# Patient Record
Sex: Female | Born: 2000 | Race: White | Hispanic: No | Marital: Single | Smoking: Never smoker
Health system: Southern US, Community
[De-identification: ages and names within clinical notes are randomized; demographics above are authoritative.]

## PROBLEM LIST (undated history)

## (undated) ENCOUNTER — Inpatient Hospital Stay (HOSPITAL_COMMUNITY): Payer: Self-pay

## (undated) DIAGNOSIS — F909 Attention-deficit hyperactivity disorder, unspecified type: Secondary | ICD-10-CM

## (undated) DIAGNOSIS — O139 Gestational [pregnancy-induced] hypertension without significant proteinuria, unspecified trimester: Secondary | ICD-10-CM

## (undated) DIAGNOSIS — R87629 Unspecified abnormal cytological findings in specimens from vagina: Secondary | ICD-10-CM

## (undated) DIAGNOSIS — F32A Depression, unspecified: Secondary | ICD-10-CM

## (undated) DIAGNOSIS — F419 Anxiety disorder, unspecified: Secondary | ICD-10-CM

## (undated) HISTORY — DX: Anxiety disorder, unspecified: F41.9

## (undated) HISTORY — DX: Unspecified abnormal cytological findings in specimens from vagina: R87.629

## (undated) HISTORY — DX: Attention-deficit hyperactivity disorder, unspecified type: F90.9

## (undated) HISTORY — PX: WISDOM TOOTH EXTRACTION: SHX21

## (undated) HISTORY — DX: Depression, unspecified: F32.A

## (undated) HISTORY — DX: Gestational (pregnancy-induced) hypertension without significant proteinuria, unspecified trimester: O13.9

---
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
## 2018-04-05 ENCOUNTER — Other Ambulatory Visit: Payer: Self-pay | Admitting: Otolaryngology

## 2018-04-05 DIAGNOSIS — M898X9 Other specified disorders of bone, unspecified site: Secondary | ICD-10-CM

## 2018-04-11 ENCOUNTER — Ambulatory Visit
Admission: RE | Admit: 2018-04-11 | Discharge: 2018-04-11 | Disposition: A | Discharge status: Home / Self Care | Payer: 59 | Source: Ambulatory Visit | Attending: Otolaryngology | Admitting: Otolaryngology

## 2018-04-11 DIAGNOSIS — M898X9 Other specified disorders of bone, unspecified site: Secondary | ICD-10-CM

## 2018-07-03 ENCOUNTER — Ambulatory Visit (INDEPENDENT_AMBULATORY_CARE_PROVIDER_SITE_OTHER): Payer: 59 | Admitting: Women's Health

## 2018-07-03 ENCOUNTER — Encounter: Payer: Self-pay | Admitting: Women's Health

## 2018-07-03 VITALS — BP 118/80 | Ht 61.0 in | Wt 170.0 lb

## 2018-07-03 DIAGNOSIS — Z113 Encounter for screening for infections with a predominantly sexual mode of transmission: Secondary | ICD-10-CM | POA: Diagnosis not present

## 2018-07-03 DIAGNOSIS — Z01419 Encounter for gynecological examination (general) (routine) without abnormal findings: Secondary | ICD-10-CM | POA: Diagnosis not present

## 2018-07-03 MED ORDER — FLUCONAZOLE 150 MG PO TABS: 150.0000 mg | ORAL_TABLET | Freq: Once | ORAL | 0 refills | Status: AC

## 2018-07-03 MED ORDER — ETONOGESTREL-ETHINYL ESTRADIOL 0.12-0.015 MG/24HR VA RING: VAGINAL_RING | VAGINAL | 4 refills | Status: DC

## 2018-07-03 NOTE — Patient Instructions (Signed)

## 2018-07-03 NOTE — Progress Notes (Signed)
Sarah Rollins [birth year] [id number]    History:    Presents for annual exam.  Irregular cycle, cycle started yesterday, had been on Depo-Provera until September.  OCs in the past unable to remember to take daily.  One partner for 1 year, recent break-up with questionable fidelity.  Currently being treated for UTI.   Gardasil completed.  Normal CBC, CMP 02/2018.  Past medical history, past surgical history, family history and social history were all reviewed and documented in the EPIC chart.  Currently not working or attending school.  Lives with her mother.  ROS:  A ROS was performed and pertinent positives and negatives are included.  Exam:  Vitals:   07/03/18 1457  BP: 118/80  Weight: 170 lb (77.1 kg)  Height: 5\' 1"  (1.549 m)   Body mass index is 32.12 kg/m.   General appearance:  Normal Thyroid:  Symmetrical, normal in size, without palpable masses or nodularity. Respiratory  Auscultation:  Clear without wheezing or rhonchi Cardiovascular  Auscultation:  Regular rate, without rubs, murmurs or gallops  Edema/varicosities:  Not grossly evident Abdominal  Soft,nontender, without masses, guarding or rebound.  Liver/spleen:  No organomegaly noted  Hernia:  None appreciated  Skin  Inspection:  Grossly normal   Breasts: Examined lying and sitting.     Right: Without masses, retractions, discharge or axillary adenopathy.     Left: Without masses, retractions, discharge or axillary adenopathy. Gentitourinary   Inguinal/mons:  Normal without inguinal adenopathy  External genitalia:  Normal  BUS/Urethra/Skene's glands:  Normal  Vagina:  Normal  Cervix:  Normal  Uterus:   normal in size, shape and contour.  Midline and mobile  Adnexa/parametria:     Rt: Without masses or tenderness.   Lt: Without masses or tenderness.  Anus and perineum: Normal    Assessment/Plan:  18 y.o. S WF G0 for annual exam with no complaints.  Contraception management Recurrent UTIs/currently being  treated Obesity STD screen Labs-primary care  Plan: Contraception options reviewed, NuvaRing prescription, proper use, slight risk for blood clots and strokes reviewed.  Start up instructions discussed will insert first of the month remove the 25th, questions answered, reviewed importance of condoms until permanent partner.  Aware not effective first month.  SBEs, exercise, calcium rich foods, MVI daily encouraged.  Campus safety reviewed.  GC/chlamydia, HIV, hep B, C, RPR.  Diflucan 150 mg 1 dose given if needed after current antibiotic use for UTI.  UTI prevention discussed.  UA pending.   Harrington Challenger [facility], 3:12 PM 07/03/2018

## 2018-07-04 LAB — RPR: RPR Ser Ql: NONREACTIVE

## 2018-07-04 LAB — HIV ANTIBODY (ROUTINE TESTING W REFLEX): HIV 1&2 Ab, 4th Generation: NONREACTIVE

## 2018-07-04 LAB — C. TRACHOMATIS/N. GONORRHOEAE RNA
C. trachomatis RNA, TMA: NOT DETECTED
N. GONORRHOEAE RNA, TMA: NOT DETECTED

## 2018-07-04 LAB — HEPATITIS C ANTIBODY
Hepatitis C Ab: NONREACTIVE
SIGNAL TO CUT-OFF: 0.03 (ref <1.00)

## 2018-07-04 LAB — HEPATITIS B SURFACE ANTIGEN: Hepatitis B Surface Ag: NONREACTIVE

## 2018-07-05 LAB — URINE CULTURE
MICRO NUMBER:: 114746
Result:: NO GROWTH
SPECIMEN QUALITY:: ADEQUATE

## 2018-07-05 LAB — URINALYSIS, COMPLETE W/RFL CULTURE
BACTERIA UA: NONE SEEN /HPF
BILIRUBIN URINE: NEGATIVE
Hyaline Cast: NONE SEEN /LPF
KETONES UR: NEGATIVE
LEUKOCYTE ESTERASE: NEGATIVE
Nitrites, Initial: POSITIVE — AB
PROTEIN: NEGATIVE
SQUAMOUS EPITHELIAL / LPF: NONE SEEN /HPF (ref <=5)
Specific Gravity, Urine: 1.015 (ref 1.001–1.03)
pH: 5 (ref 5.0–8.0)

## 2018-07-05 LAB — CULTURE INDICATED

## 2018-10-17 ENCOUNTER — Ambulatory Visit (INDEPENDENT_AMBULATORY_CARE_PROVIDER_SITE_OTHER): Payer: 59 | Admitting: Women's Health

## 2018-10-17 ENCOUNTER — Encounter: Payer: Self-pay | Admitting: Women's Health

## 2018-10-17 ENCOUNTER — Other Ambulatory Visit: Payer: Self-pay

## 2018-10-17 VITALS — BP 128/80

## 2018-10-17 DIAGNOSIS — L739 Follicular disorder, unspecified: Secondary | ICD-10-CM

## 2018-10-17 MED ORDER — FLUCONAZOLE 150 MG PO TABS: 150.0000 mg | ORAL_TABLET | Freq: Once | ORAL | 1 refills | Status: AC

## 2018-10-17 MED ORDER — SULFAMETHOXAZOLE-TRIMETHOPRIM 800-160 MG PO TABS: 1.0000 | ORAL_TABLET | Freq: Two times a day (BID) | ORAL | 0 refills | Status: DC

## 2018-10-17 NOTE — Patient Instructions (Signed)

## 2018-10-17 NOTE — Progress Notes (Signed)
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  Questions if it is a spider bite.?  Denies vaginal discharge, itching, odor, urinary symptoms, abdominal pain or fever.  Not currently sexually active, denies need for STD screen, negative screen 06/2018.  Had been on NuvaRing, had lost insurance so stopped.  On last day of cycle today.  Exam: Appears well.  External genitalia right labia majora edematous, erythemic nonindurated,  nonfluctuant, tender to touch.  No visible discharge.  Folliculitis  Plan: Reviewed most likely a folliculitis, encouraged warm baths, keep area dry and clean, open to air as able, loose clothing.  Septra twice daily for 7 days, reviewed this may help it to drain and resolve.  Diflucan 1501 dose prescriptions for both given.  Instructed to call if no relief or area does not improve.  A sample of NuvaRing given, insert today, aware not contraceptive first month  but states is not planning on being sexually active.  Reviewed importance of condoms especially first month and  until permanent partner.Marland Kitchen

## 2018-12-13 ENCOUNTER — Telehealth: Payer: Self-pay | Admitting: *Deleted

## 2018-12-13 MED ORDER — ETONOGESTREL-ETHINYL ESTRADIOL 0.12-0.015 MG/24HR VA RING: 1.0000 | VAGINAL_RING | VAGINAL | 1 refills | Status: DC

## 2018-12-13 NOTE — Telephone Encounter (Signed)
Patient called requesting nuvaring Rx sent to pharmacy. Rx was not received by pharmacy from [facility] 07/03/18.

## 2018-12-23 IMAGING — CT CT HEAD W/O CM
1 series · 16 of 30 positions shown, 20 images · non-contrast
Comparison: 02/19/2017

CLINICAL DATA: Bony growth on the skull behind the right ear.

EXAM:
CT HEAD WITHOUT CONTRAST
TECHNIQUE: Contiguous axial images were obtained from the base of the skull
through the vertex without intravenous contrast.

[Series 2: head w/(date) · axial · 0.44mm/px · z∈[-190,-45]mm · 16 of 33 slices shown, 20 images]
[im 2/33  brain]
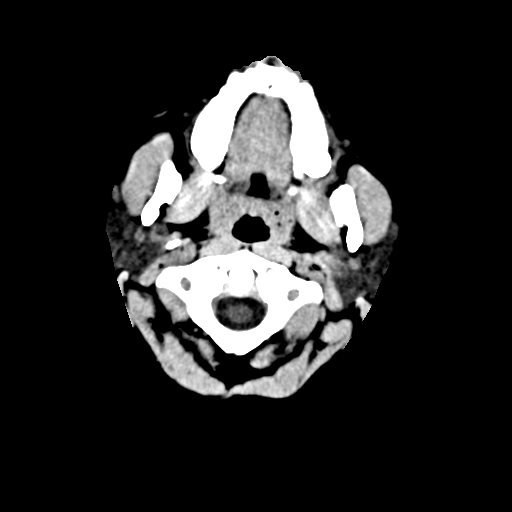
[im 2/33  bone]
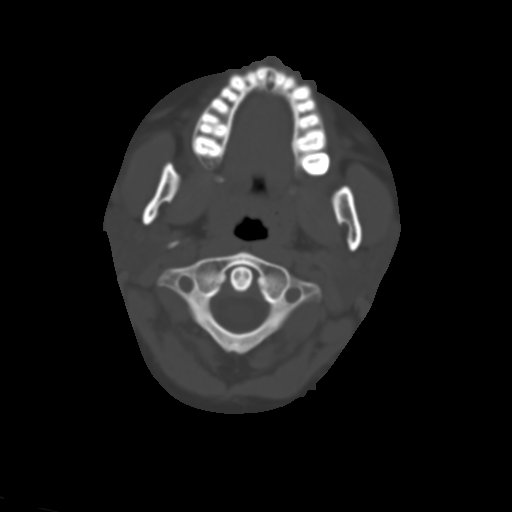
[im 4/33  brain]
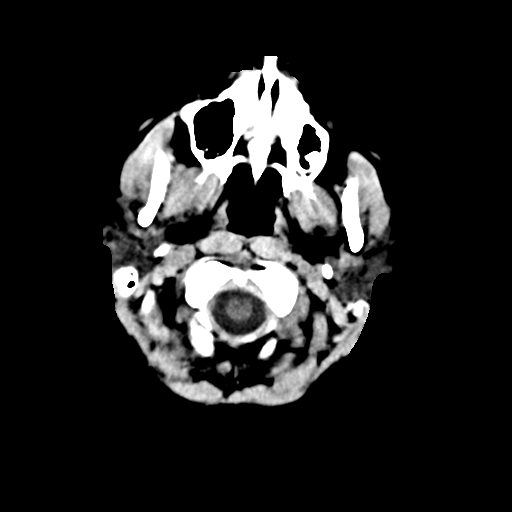
[im 6/33  brain]
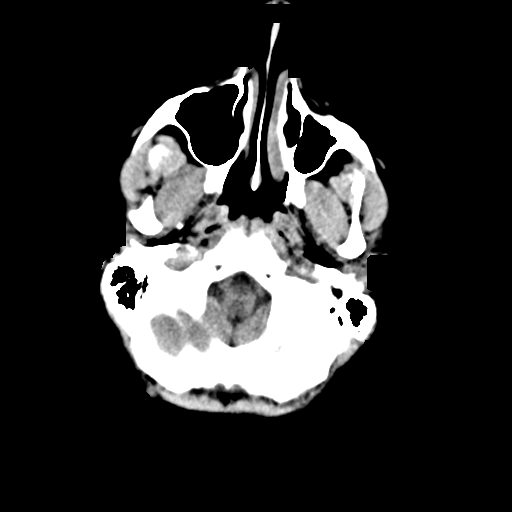
[im 8/33  brain]
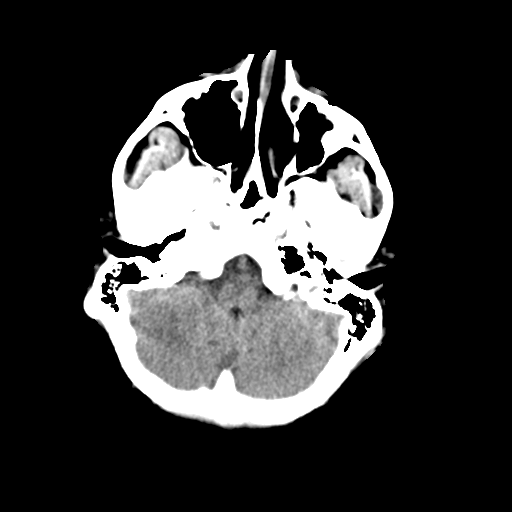
[im 9/33  brain]
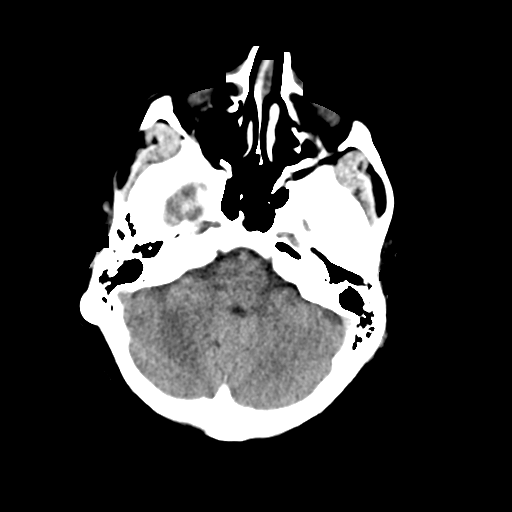
[im 9/33  bone]
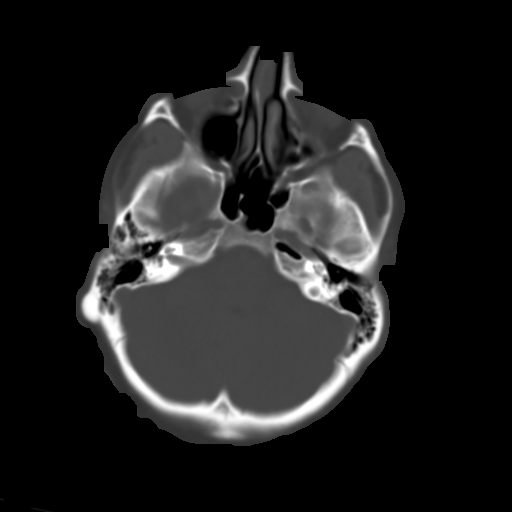
[im 12/33  brain]
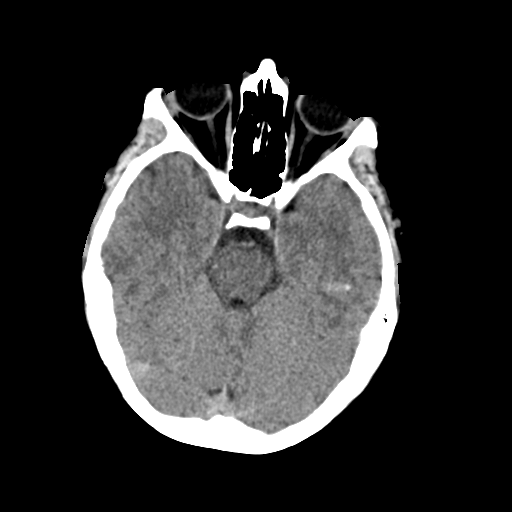
[im 14/33  brain]
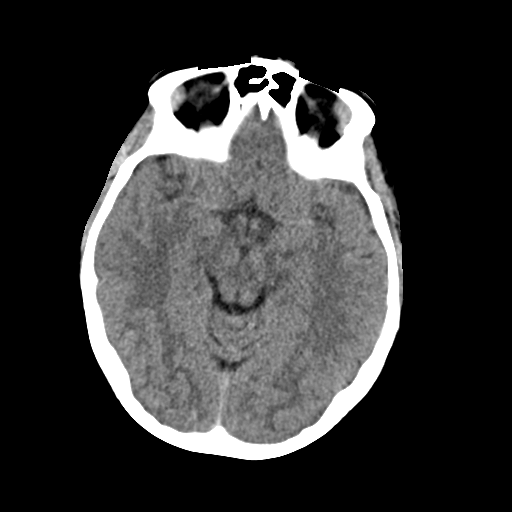
[im 16/33  brain]
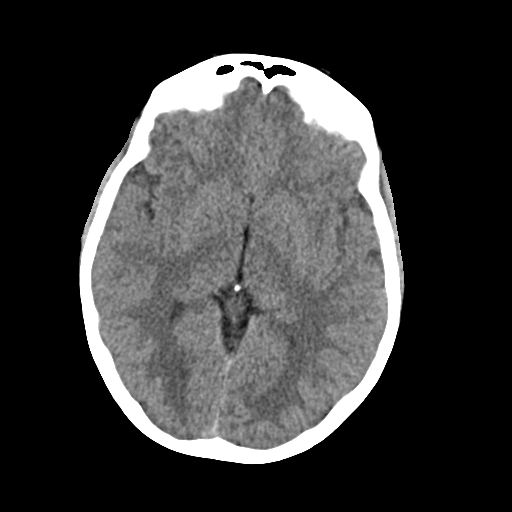
[im 17/33  brain]
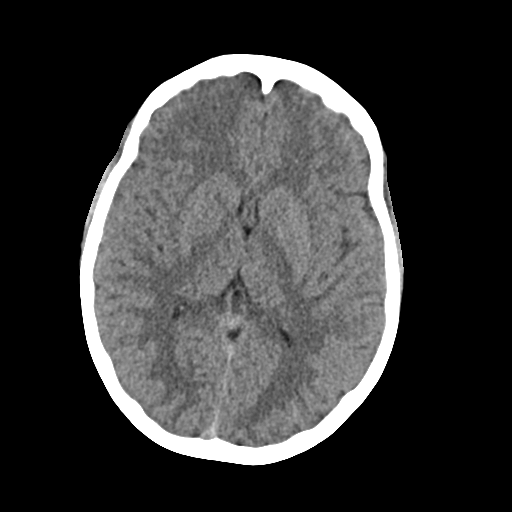
[im 17/33  bone]
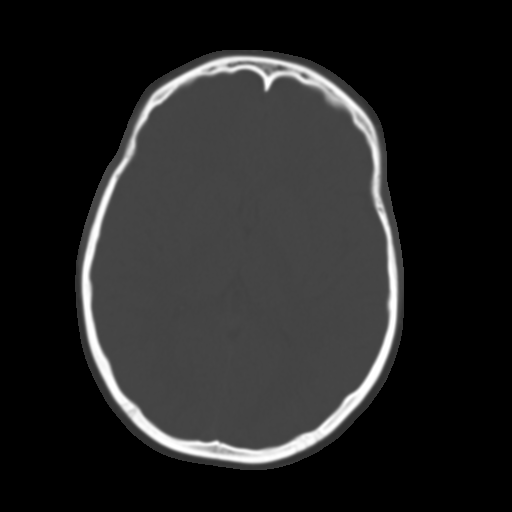
[im 19/33  brain]
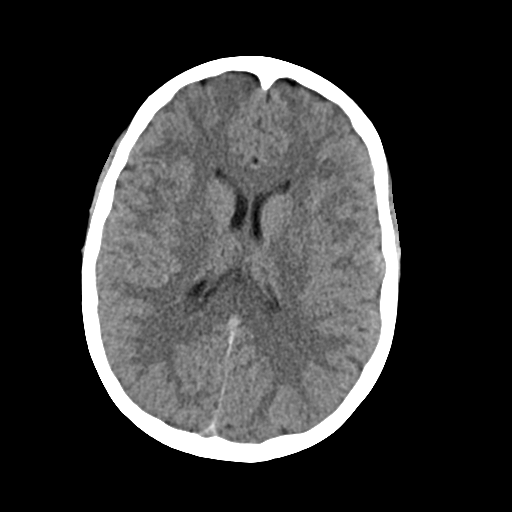
[im 21/33  brain]
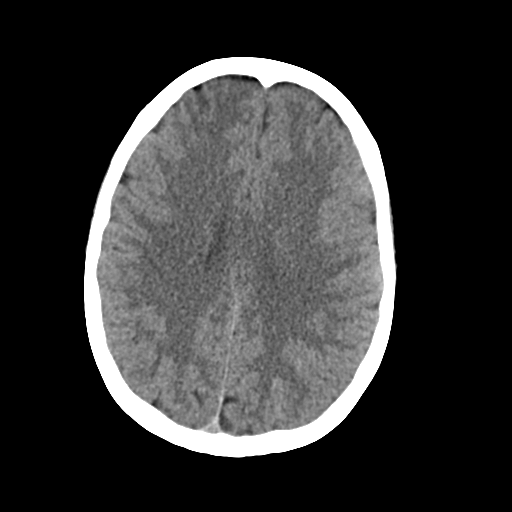
[im 24/33  brain]
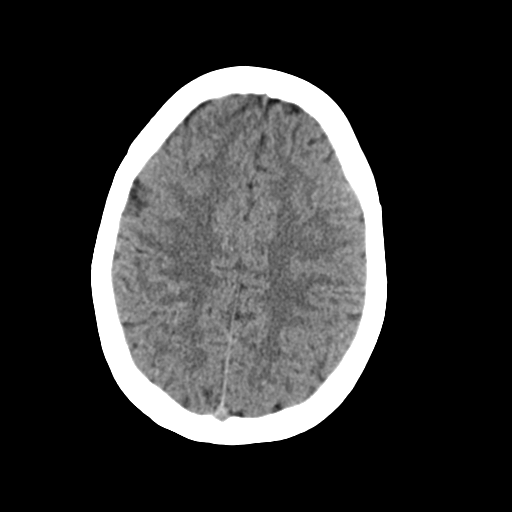
[im 25/33  brain]
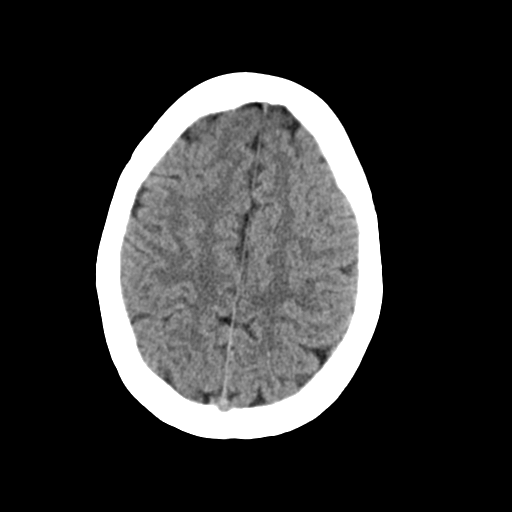
[im 25/33  bone]
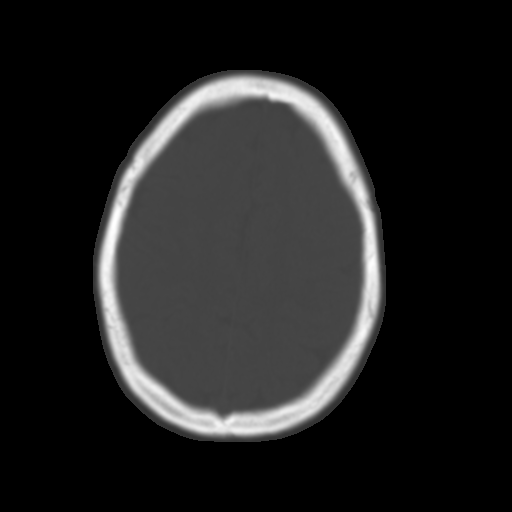
[im 27/33  brain]
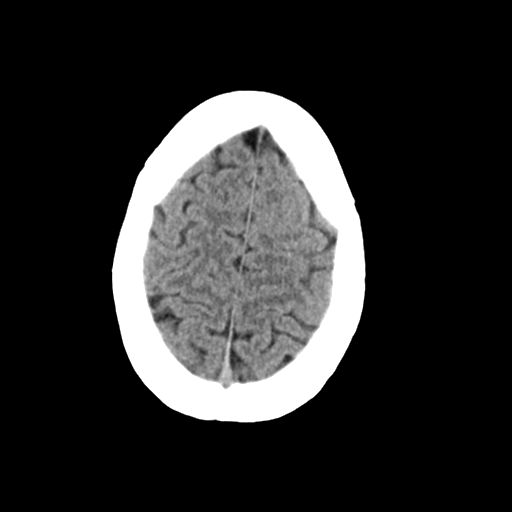
[im 29/33  brain]
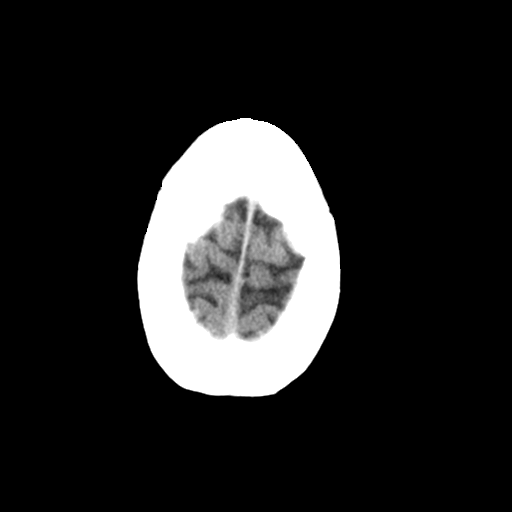
[im 31/33  brain]
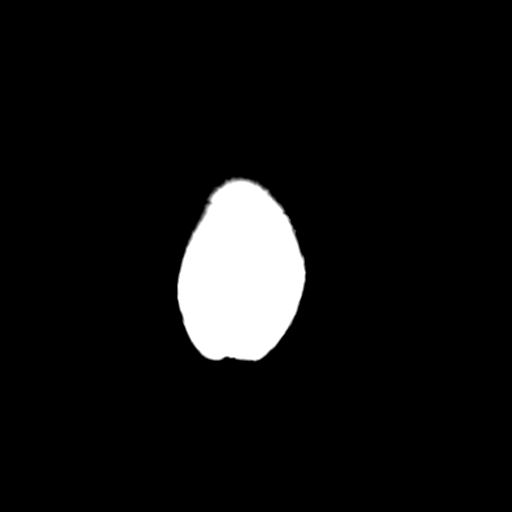

[16 of 30 positions shown; findings below may reference images not displayed]

FINDINGS: Brain: There is no evidence of acute infarct, intracranial
hemorrhage, mass, midline shift, or extra-axial fluid collection.
The ventricles and sulci are normal.

Vascular: No hyperdense vessel.

Skull: 1.3 cm homogeneous osseous lesion contiguous with the outer
table of the mastoid portion of the right temporal bone, unchanged
and without evidence of a soft tissue component or aggressive
osseous finding.

Sinuses/Orbits: Paranasal sinuses and mastoid air cells are clear
with the left maxillary sinus being small. Orbits are unremarkable.

Other: None.
IMPRESSION: 1. 1.3 cm right temporal bone osteoma.
2. Unremarkable CT appearance of the brain.

## 2019-01-31 ENCOUNTER — Encounter (HOSPITAL_BASED_OUTPATIENT_CLINIC_OR_DEPARTMENT_OTHER): Payer: Self-pay

## 2019-01-31 ENCOUNTER — Other Ambulatory Visit: Payer: Self-pay

## 2019-01-31 ENCOUNTER — Emergency Department (HOSPITAL_BASED_OUTPATIENT_CLINIC_OR_DEPARTMENT_OTHER)
Admission: EM | Admit: 2019-01-31 | Discharge: 2019-01-31 | Disposition: A | Discharge status: Home / Self Care | Payer: 59 | Attending: Emergency Medicine | Admitting: Emergency Medicine

## 2019-01-31 DIAGNOSIS — Z79899 Other long term (current) drug therapy: Secondary | ICD-10-CM | POA: Insufficient documentation

## 2019-01-31 DIAGNOSIS — L02412 Cutaneous abscess of left axilla: Secondary | ICD-10-CM | POA: Insufficient documentation

## 2019-01-31 DIAGNOSIS — Z793 Long term (current) use of hormonal contraceptives: Secondary | ICD-10-CM | POA: Insufficient documentation

## 2019-01-31 LAB — PREGNANCY, URINE: Preg Test, Ur: NEGATIVE

## 2019-01-31 MED ORDER — SULFAMETHOXAZOLE-TRIMETHOPRIM 800-160 MG PO TABS
1.0000 | ORAL_TABLET | Freq: Once | ORAL | Status: AC
  Administered 2019-01-31: 1 via ORAL
  Filled 2019-01-31: qty 1

## 2019-01-31 MED ORDER — SULFAMETHOXAZOLE-TRIMETHOPRIM 800-160 MG PO TABS: 1.0000 | ORAL_TABLET | Freq: Two times a day (BID) | ORAL | 0 refills | Status: AC

## 2019-01-31 MED ORDER — HYDROCODONE-ACETAMINOPHEN 5-325 MG PO TABS: 1.0000 | ORAL_TABLET | Freq: Four times a day (QID) | ORAL | 0 refills | Status: DC | PRN

## 2019-01-31 MED ORDER — PENTAFLUOROPROP-TETRAFLUOROETH EX AERO
INHALATION_SPRAY | CUTANEOUS | Status: AC
  Filled 2019-01-31: qty 30

## 2019-01-31 MED ORDER — HYDROCODONE-ACETAMINOPHEN 5-325 MG PO TABS
1.0000 | ORAL_TABLET | Freq: Once | ORAL | Status: AC
  Administered 2019-01-31: 1 via ORAL
  Filled 2019-01-31: qty 1

## 2019-01-31 MED ORDER — LIDOCAINE 4 % EX CREA
TOPICAL_CREAM | CUTANEOUS | Status: AC
  Filled 2019-01-31: qty 5

## 2019-01-31 MED ORDER — LIDOCAINE HCL (PF) 1 % IJ SOLN
5.0000 mL | Freq: Once | INTRAMUSCULAR | Status: AC
  Administered 2019-01-31: 5 mL
  Filled 2019-01-31: qty 5

## 2019-01-31 MED ORDER — HYDROCODONE-ACETAMINOPHEN 5-325 MG PO TABS
2.0000 | ORAL_TABLET | Freq: Once | ORAL | Status: AC
  Administered 2019-01-31: 19:00:00 2 via ORAL
  Filled 2019-01-31: qty 2

## 2019-01-31 NOTE — ED Triage Notes (Signed)
Pt c/o abscess x 3 to left axilla x 1-2 weeks-NAD-steady gait

## 2019-01-31 NOTE — Discharge Instructions (Addendum)
You have been diagnosed today with abscess of the left axilla.  At this time there does not appear to be the presence of an emergent medical condition, however there is always the potential for conditions to change. Please read and follow the below instructions.  Please return to the Emergency Department immediately for any new or worsening symptoms. Please be sure to follow up with your Primary Care Provider within one week regarding your visit today; please call their office to schedule an appointment even if you are feeling better for a follow-up visit. Please take the antibiotic Bactrim as prescribed for treatment of your skin infection.  Please use additional contraceptive medications while taking antibiotics as antibiotics may lower the effectiveness of birth control and increase chance of pregnancy. Please drink plenty water and get plenty of rest over the next few days.  Use warm soaks with clean water and washcloth every 2 hours to facilitate continued drainage.  Please follow-up with your primary care provider in 2 days for recheck of the area to ensure proper healing. You have been prescribed a short course of pain medication called Norco.  Do not drive or operate machinery while taking Norco as will make you drowsy.  Do not drink alcohol or take other sedating medications with Norco as this will worsen side effects.  Get help right away if: You have very bad (severe) pain. You see red streaks on your skin spreading away from the abscess. You have fevers/chills You have nausea/vomiting You have any new/concerning or worsening symptoms.  Please read the additional information packets attached to your discharge summary.  Do not take your medicine if  develop an itchy rash, swelling in your mouth or lips, or difficulty breathing; call 911 and seek immediate emergency medical attention if this occurs.

## 2019-01-31 NOTE — ED Provider Notes (Addendum)
[facility] EMERGENCY DEPARTMENT Provider Note   CSN: [id number] Arrival date & time: 01/31/19  1644     History   Chief Complaint Chief Complaint  Patient presents with  . Abscess    HPI Sarah Rollins is a 18 y.o. female presented today for 3-day history of pain and swelling of the left axilla.  Patient reports that she had a small abscess in this area that spontaneously drained at the beginning of [place] the beach.  She reports that the symptoms have recurred and have been worsening over the past 3 days, she denies any new drainage.  She reports a severe throbbing sensation constant worsened with palpation and movement of the left arm and without alleviating factors.  Patient denies any fevers/chills, nausea/vomiting, chest pain, abdominal pain, numbness/weakness, tingling or any additional concerns today.     HPI  Past Medical History:  Diagnosis Date  . ADHD     Patient Active Problem List   Diagnosis Date Noted  . Acute pyelonephritis 05/29/2017  . Hypokalemia 05/29/2017  . Hypomagnesemia 05/29/2017  . Hyponatremia 05/29/2017  . Sinus tachycardia 05/29/2017    Past Surgical History:  Procedure Laterality Date  . WISDOM TOOTH EXTRACTION       OB History    Gravida  0   Para  0   Term  0   Preterm  0   AB  0   Living  0     SAB  0   TAB  0   Ectopic  0   Multiple  0   Live Births  0            Home Medications    Prior to Admission medications   Medication Sig Start Date End Date Taking? Authorizing Provider  etonogestrel-ethinyl estradiol (NUVARING) 0.12-0.015 MG/24HR vaginal ring Place 1 each vaginally every 28 (twenty-eight) days. Insert vaginally and leave in place for 3 consecutive weeks, then remove for 1 week. 12/13/18   Harrington ChallengerYoung, Nancy J, NP  meloxicam (MOBIC) 7.5 MG tablet Take 1 tablet by mouth daily. 03/06/18   [provider]  naproxen (NAPROSYN) 500 MG tablet Take by mouth as needed. 08/30/17   [provider]  omeprazole (PRILOSEC) 40 MG capsule Take 1 capsule by mouth daily. 06/18/18   [provider]  sulfamethoxazole-trimethoprim (BACTRIM DS) 800-160 MG tablet Take 1 tablet by mouth 2 (two) times daily for 7 days. 01/31/19 02/07/19  Bill SalinasMorelli, Cloris Flippo A, PA-C    Family History Family History  Problem Relation Age of Onset  . Depression Sister   . Anxiety disorder Sister   . Hypertension Mother   . Heart Problems Mother   . Colon cancer Maternal Grandfather   . Breast cancer Paternal Grandmother     Social History Social History   Tobacco Use  . Smoking status: Never Smoker  . Smokeless tobacco: Never Used  Substance Use Topics  . Alcohol use: Yes  . Drug use: No     Allergies   Patient has no known allergies.   Review of Systems Review of Systems Ten systems are reviewed and are negative for acute change except as noted in the HPI  Physical Exam Updated Vital Signs BP 119/66 (BP Location: Left Arm)   Pulse (!) 113   Temp 98.9 F (37.2 C) (Oral)   Resp 18   Ht 5\' 1"  (1.549 m)   Wt 78.5 kg   SpO2 99%   BMI 32.69 kg/m   Physical  Exam Constitutional:      General: She is not in acute distress.    Appearance: Normal appearance. She is well-developed. She is obese. She is not ill-appearing or diaphoretic.  HENT:     Head: Normocephalic and atraumatic.     Right Ear: External ear normal.     Left Ear: External ear normal.     Nose: Nose normal.  Eyes:     General: Vision grossly intact. Gaze aligned appropriately.     Pupils: Pupils are equal, round, and reactive to light.  Neck:     Musculoskeletal: Normal range of motion.     Trachea: Trachea and phonation normal. No tracheal deviation.  Pulmonary:     Effort: Pulmonary effort is normal. No respiratory distress.  Abdominal:     General: There is no distension.     Palpations: Abdomen is soft.     Tenderness: There is no abdominal tenderness. There is no guarding or rebound.   Musculoskeletal: Normal range of motion.  Skin:    General: Skin is warm and dry.          Comments: Patient with small area of fluctuance in the left axilla approximately 2 cm in diameter.  Additional she has a second smaller approximate 1 cm area of induration just posterior.  Neurological:     Mental Status: She is alert.     GCS: GCS eye subscore is 4. GCS verbal subscore is 5. GCS motor subscore is 6.     Comments: Speech is clear and goal oriented, follows commands Major Cranial nerves without deficit, no facial droop Moves extremities without ataxia, coordination intact  Psychiatric:        Mood and Affect: Mood is anxious.        Behavior: Behavior normal.      ED Treatments / Results  Labs (all labs ordered are listed, but only abnormal results are displayed) Labs Reviewed  PREGNANCY, URINE    EKG None  Radiology No results found.  Procedures Ultrasound ED Soft Tissue  Date/Time: 01/31/2019 9:13 PM Performed by: Bill SalinasMorelli, Itzayanna Kaster A, PA-C Authorized by: Bill SalinasMorelli, Tyah Acord A, PA-C   Procedure details:    Indications: localization of abscess and evaluate for cellulitis     Transverse view:  Visualized   Longitudinal view:  Visualized   Images: archived   Location:    Location: axilla     Side:  Left Findings:     abscess present    cellulitis present    no foreign body present Comments:     Single abscess identified.  Additionally the patient second area of swelling and induration was without identifiable abscess, appears as cellulitis.   Marland Kitchen..Incision and Drainage  Date/Time: 02/03/2019 3:51 PM Performed by: Bill SalinasMorelli, Josafat Enrico A, PA-C Authorized by: Bill SalinasMorelli, Gabriella Woodhead A, PA-C   Consent:    Consent obtained:  Verbal   Consent given by:  Patient and parent   Risks discussed:  Bleeding, incomplete drainage, pain, infection and damage to other organs Location:    Type:  Abscess   Size:  2cm   Location:  Upper extremity (Left Axilla) Pre-procedure details:     Skin preparation:  Betadine Anesthesia (see MAR for exact dosages):    Anesthesia method:  Topical application and local infiltration   Topical anesthetic:  LET   Local anesthetic:  Lidocaine 1% w/o epi Procedure type:    Complexity:  Simple Procedure details:    Incision types:  Single straight   Scalpel blade:  11  Wound management:  Probed and deloculated   Drainage:  Purulent   Drainage amount:  Moderate   Wound treatment:  Wound left open   Packing materials:  None Post-procedure details:    Patient tolerance of procedure:  Tolerated well, no immediate complications   (including critical care time)  Medications Ordered in ED Medications  lidocaine (LMX) 4 % cream (has no administration in time range)  pentafluoroprop-tetrafluoroeth (GEBAUERS) aerosol (has no administration in time range)  lidocaine (PF) (XYLOCAINE) 1 % injection 5 mL (5 mLs Infiltration Given 01/31/19 1835)  HYDROcodone-acetaminophen (NORCO/VICODIN) 5-325 MG per tablet 2 tablet (2 tablets Oral Given 01/31/19 1915)     Initial Impression / Assessment and Plan / ED Course  I have reviewed the triage vital signs and the nursing notes.  Pertinent labs & imaging results that were available during my care of the patient were reviewed by me and considered in my medical decision making (see chart for details).     Urine pregnancy test negative  18 year old otherwise healthy female presents today for left axillary pain and swelling.  She has 1 identifiable abscess that was amenable to incision and drainage, additionally 1 area of induration with no identifiable abscess on ultrasound.  Abscess was not large enough to warrant packing or drain. Patient informed to have wound recheck in 2 days. I have encouraged home warm soaks and flushing.  Mild signs of cellulitis is surrounding skin.  Plan to discharge patient. Patient given extensive return precautions. Patient states understanding of return precautions and is  agreeable with plan.   Patient does have surrounding signs of cellulitis as well as 1 area of induration that is not amenable to incision and drainage at this time, will prescribe patient Bactrim hopes that the area of induration will improve without abscess formation.  She is to follow-up with PCP in 2 days for recheck.  Additionally patient informed to use additional contraceptives while taking antibiotics and she states understanding.  Patient's mother is here to drive her home today as she has received Norco.  Additionally suspect patient's initial tachycardia secondary to her anxiety, do not suspect systemic infection patient is overall well appearing and in no acute distress.  At this time there does not appear to be any evidence of an acute emergency medical condition and the patient appears stable for discharge with appropriate outpatient follow up. Diagnosis was discussed with patient who verbalizes understanding of care plan and is agreeable to discharge. I have discussed return precautions with patient and mother who verbalizes understanding of return precautions. Patient encouraged to follow-up with their PCP. All questions answered.  Patient has been discharged in good condition.  Addendum; Patient and mother concern for continuing pain, she reports she is unable to sleep last night secondary to pain related to her abscess.  She reports continued pain.  I discussed OTC anti-inflammatories with the patient and her mother and they state understanding.  PMP reviewed patient without any active narcotic prescriptions, will give patient 2 pills of Norco at this time, patient informed of precautions regarding narcotics and states understanding.  Informed patient that symptoms should improve as her abscess has been drained today.  She will follow-up with her primary care provider.  Note: Portions of this report may have been transcribed using voice recognition software. Every effort was made to ensure  accuracy; however, inadvertent computerized transcription errors may still be present. Final Clinical Impressions(s) / ED Diagnoses   Final diagnoses:  Abscess of left axilla  ED Discharge Orders         Ordered    sulfamethoxazole-trimethoprim (BACTRIM DS) 800-160 MG tablet  2 times daily     01/31/19 2120           Gari Crown 01/31/19 2122    Deliah Boston, PA-C 01/31/19 2143    Deno Etienne, DO 01/31/19 2331    Deliah Boston, PA-C 02/03/19 [place], [place], DO 02/04/19 [phone]

## 2019-07-06 ENCOUNTER — Other Ambulatory Visit: Payer: Self-pay

## 2019-07-09 ENCOUNTER — Encounter: Payer: 59 | Admitting: Women's Health

## 2019-07-09 DIAGNOSIS — Z0289 Encounter for other administrative examinations: Secondary | ICD-10-CM

## 2020-02-26 ENCOUNTER — Other Ambulatory Visit: Payer: Self-pay

## 2020-02-26 ENCOUNTER — Emergency Department (HOSPITAL_COMMUNITY)
Admission: EM | Admit: 2020-02-26 | Discharge: 2020-02-26 | Disposition: A | Discharge status: Home / Self Care | Payer: Self-pay | Attending: Emergency Medicine | Admitting: Emergency Medicine

## 2020-02-26 ENCOUNTER — Encounter (HOSPITAL_COMMUNITY): Payer: Self-pay

## 2020-02-26 ENCOUNTER — Emergency Department (HOSPITAL_COMMUNITY): Payer: Self-pay

## 2020-02-26 DIAGNOSIS — O4690 Antepartum hemorrhage, unspecified, unspecified trimester: Secondary | ICD-10-CM

## 2020-02-26 DIAGNOSIS — O208 Other hemorrhage in early pregnancy: Secondary | ICD-10-CM | POA: Insufficient documentation

## 2020-02-26 DIAGNOSIS — N939 Abnormal uterine and vaginal bleeding, unspecified: Secondary | ICD-10-CM

## 2020-02-26 DIAGNOSIS — Z3A01 Less than 8 weeks gestation of pregnancy: Secondary | ICD-10-CM | POA: Insufficient documentation

## 2020-02-26 LAB — URINALYSIS, ROUTINE W REFLEX MICROSCOPIC
Bilirubin Urine: NEGATIVE
Glucose, UA: NEGATIVE mg/dL
Ketones, ur: 20 mg/dL — AB
Nitrite: NEGATIVE
Protein, ur: NEGATIVE mg/dL
Specific Gravity, Urine: 1.027 (ref 1.005–1.030)
pH: 5 (ref 5.0–8.0)

## 2020-02-26 LAB — POC URINE PREG, ED: Preg Test, Ur: POSITIVE — AB

## 2020-02-26 LAB — HCG, QUANTITATIVE, PREGNANCY: hCG, Beta Chain, Quant, S: 22279 m[IU]/mL — ABNORMAL HIGH (ref <5)

## 2020-02-26 LAB — ABO/RH: ABO/RH(D): O POS

## 2020-02-26 MED ORDER — METOCLOPRAMIDE HCL 5 MG PO TABS: 5.0000 mg | ORAL_TABLET | Freq: Three times a day (TID) | ORAL | 0 refills | Status: DC

## 2020-02-26 MED ORDER — METOCLOPRAMIDE HCL 10 MG PO TABS
10.0000 mg | ORAL_TABLET | Freq: Once | ORAL | Status: AC
  Administered 2020-02-26: 10 mg via ORAL
  Filled 2020-02-26: qty 1

## 2020-02-26 NOTE — ED Provider Notes (Signed)
[facility] EMERGENCY DEPARTMENT Provider Note   CSN: [id number] Arrival date & time: 02/26/20  3151     History Chief Complaint  Patient presents with  . Vaginal Bleeding    Sarah Rollins is a 19 y.o. female.  No language interpreter was used.  Vaginal Bleeding Quality:  Unable to specify Severity:  Moderate Onset quality:  Gradual Timing:  Constant Progression:  Worsening Chronicity:  New Menstrual history:  Regular Possible pregnancy: yes   Relieved by:  Nothing Pt reports she noticed some spotting today.  Pt had a positive home pregnancy test.      Past Medical History:  Diagnosis Date  . ADHD     Patient Active Problem List   Diagnosis Date Noted  . Acute pyelonephritis 05/29/2017  . Hypokalemia 05/29/2017  . Hypomagnesemia 05/29/2017  . Hyponatremia 05/29/2017  . Sinus tachycardia 05/29/2017    Past Surgical History:  Procedure Laterality Date  . WISDOM TOOTH EXTRACTION       OB History    Gravida  1   Para  0   Term  0   Preterm  0   AB  0   Living  0     SAB  0   TAB  0   Ectopic  0   Multiple  0   Live Births  0           Family History  Problem Relation Age of Onset  . Depression Sister   . Anxiety disorder Sister   . Hypertension Mother   . Heart Problems Mother   . Colon cancer Maternal Grandfather   . Breast cancer Paternal Grandmother     Social History   Tobacco Use  . Smoking status: Never Smoker  . Smokeless tobacco: Never Used  Substance Use Topics  . Alcohol use: Yes  . Drug use: No    Home Medications Prior to Admission medications   Medication Sig Start Date End Date Taking? Authorizing Provider  etonogestrel-ethinyl estradiol (NUVARING) 0.12-0.015 MG/24HR vaginal ring Place 1 each vaginally every 28 (twenty-eight) days. Insert vaginally and leave in place for 3 consecutive weeks, then remove for 1 week. 12/13/18   Harrington Challenger, NP  HYDROcodone-acetaminophen (NORCO/VICODIN) 5-325 MG tablet Take 1  tablet by mouth every 6 (six) hours as needed. 01/31/19   Harlene Salts A, PA-C  meloxicam (MOBIC) 7.5 MG tablet Take 1 tablet by mouth daily. 03/06/18   [provider]  metoCLOPramide (REGLAN) 5 MG tablet Take 1 tablet (5 mg total) by mouth 3 (three) times daily. 02/26/20 03/27/20  Elson Areas, PA-C  naproxen (NAPROSYN) 500 MG tablet Take by mouth as needed. 08/30/17   [provider]  omeprazole (PRILOSEC) 40 MG capsule Take 1 capsule by mouth daily. 06/18/18   [provider]    Allergies    Patient has no known allergies.  Review of Systems   Review of Systems  Genitourinary: Positive for vaginal bleeding.  All other systems reviewed and are negative.   Physical Exam Updated Vital Signs BP (!) 126/91 (BP Location: Right Arm)   Pulse (!) 106   Temp 98.5 F (36.9 C) (Oral)   Ht 4\' 11"  (1.499 m)   Wt 66.2 kg   LMP 11/27/2019 (Exact Date)   SpO2 100%   BMI 29.49 kg/m   Physical Exam Vitals and nursing note reviewed.  Constitutional:      Appearance: She is well-developed.  HENT:     Head: Normocephalic.  Mouth/Throat:     Mouth: Mucous membranes are moist.  Cardiovascular:     Rate and Rhythm: Normal rate and regular rhythm.  Pulmonary:     Effort: Pulmonary effort is normal.  Abdominal:     General: Abdomen is flat.     Tenderness: There is no abdominal tenderness.  Musculoskeletal:        General: Normal range of motion.     Cervical back: Normal range of motion.  Skin:    General: Skin is warm.  Neurological:     Mental Status: She is alert and oriented to person, place, and time.  Psychiatric:        Mood and Affect: Mood normal.     ED Results / Procedures / Treatments   Labs (all labs ordered are listed, but only abnormal results are displayed) Labs Reviewed  HCG, QUANTITATIVE, PREGNANCY - Abnormal; Notable for the following components:      Result Value   hCG, Beta Chain, Quant, S 22,279 (*)    All other components  within normal limits  URINALYSIS, ROUTINE W REFLEX MICROSCOPIC - Abnormal; Notable for the following components:   APPearance CLOUDY (*)    Hgb urine dipstick SMALL (*)    Ketones, ur 20 (*)    Leukocytes,Ua LARGE (*)    Bacteria, UA RARE (*)    All other components within normal limits  POC URINE PREG, ED - Abnormal; Notable for the following components:   Preg Test, Ur POSITIVE (*)    All other components within normal limits  ABO/RH    EKG None  Radiology US OB LESS THAN 14 WEEKS WITH OB TRANSVAGINAL  Result Date: 02/26/2020 CLINICAL DATA:  Pain with vaginal bleed EXAM: OBSTETRIC <14 WK [place] AND TRANSVAGINAL OB US TECHNIQUE: Both transabdominal and transvaginal ultrasound examinations were performed for complete evaluation of the gestation as well as the maternal uterus, adnexal regions, and pelvic cul-de-sac. Transvaginal technique was performed to assess early pregnancy. COMPARISON:  None. FINDINGS: Intrauterine gestational sac: Single Yolk sac:  Visualized. Embryo:  Visualized. Cardiac Activity: Visualized. Heart Rate: 143 bpm Last menstrual period 11/27/2019. Gestational age by LMP: 13 weeks 0 days. CRL: 2.7 mm   5 w   6 d                  [place] EDC: 10/22/2020 Subchorionic hemorrhage: Trace subchorionic hemorrhage is visualized. Right ovary: Unremarkable limited sonographic appearance of the RIGHT ovary. Left ovary: Unremarkable limited sonographic appearance of the LEFT ovary. Other :None Free fluid:  None IMPRESSION: 1. Single live intrauterine pregnancy. Gestational assigned by ultrasound is 5 weeks 6 days with an EDC of 10/22/2020. 2. Trace subchorionic hemorrhage. Electronically Signed   By: Meda Klinefelter MD   On: 02/26/2020 14:08    Procedures Procedures (including critical care time)  Medications Ordered in ED Medications  metoCLOPramide (REGLAN) tablet 10 mg (10 mg Oral Given 02/26/20 1556)    ED Course  I have reviewed the triage vital signs and the nursing  notes.  Pertinent labs & imaging results that were available during my care of the patient were reviewed by me and considered in my medical decision making (see chart for details).    MDM Rules/Calculators/A&P                          MDM:  Small subchorionic hemorrhage. Pt given rx for reglan.  Pt advised to schedule prenatal care.avs  Final Clinical Impression(s) /  ED Diagnoses Final diagnoses:  Vaginal bleeding in pregnancy    Rx / DC Orders ED Discharge Orders         Ordered    metoCLOPramide (REGLAN) 5 MG tablet  3 times daily        02/26/20 1609        An After Visit Summary was printed and given to the patient.   Elson Areas, [place] 02/26/20 1654    Bethann Berkshire, MD 02/27/20 [phone]

## 2020-02-26 NOTE — ED Triage Notes (Signed)
Pt reports LMP was June 22 and had a positive pregnancy test 2 weeks ago.  Reports started spotting this morning.  Reports occasional sharp pain in left side.  Also reports vomiting for the past 3 days.

## 2020-02-26 NOTE — Discharge Instructions (Addendum)
Due date 10/20/20. Schedule prenatal care.  Your blood type is  O positive

## 2020-02-28 ENCOUNTER — Encounter: Payer: Self-pay | Admitting: Nurse Practitioner

## 2020-02-28 ENCOUNTER — Ambulatory Visit (INDEPENDENT_AMBULATORY_CARE_PROVIDER_SITE_OTHER): Payer: No Typology Code available for payment source | Admitting: Nurse Practitioner

## 2020-02-28 ENCOUNTER — Other Ambulatory Visit: Payer: Self-pay

## 2020-02-28 VITALS — BP 122/80

## 2020-02-28 DIAGNOSIS — Z3491 Encounter for supervision of normal pregnancy, unspecified, first trimester: Secondary | ICD-10-CM

## 2020-02-28 DIAGNOSIS — O418X10 Other specified disorders of amniotic fluid and membranes, first trimester, not applicable or unspecified: Secondary | ICD-10-CM

## 2020-02-28 DIAGNOSIS — O468X1 Other antepartum hemorrhage, first trimester: Secondary | ICD-10-CM | POA: Diagnosis not present

## 2020-02-28 NOTE — Progress Notes (Signed)
   Acute Office Visit  Subjective:    Patient ID: Sarah Rollins, female    DOB: [birth year], 19 y.o.   MRN: [id number]   HPI 19 y.o. presents today for first trimester pregnancy. LMP 11/27/2019 but she does not think she is that far along. Went to the ED 2 days ago for vaginal bleeding. Ultrasound showed single IUP, heart rate 143 bpm, CRL 2.7 mm consistent with 5 weeks 6 days, trace subchorionic hemorrhage, EDD 10/22/2020.  hCG 22,279.  Was prescribed NuvaRing a year ago but lost insurance and was not able to get filled. Not bleeding at visit. Good support from boyfriend, mom and friends. She is happy about pregnancy.    Review of Systems  Constitutional: Positive for fatigue.  Gastrointestinal: Positive for nausea. Negative for abdominal pain.  Genitourinary: Negative for vaginal bleeding.       Objective:    Physical Exam Constitutional:      Appearance: Normal appearance.     BP 122/80 (BP Location: Right Arm, Patient Position: Sitting, Cuff Size: Normal)   LMP 11/27/2019 (Exact Date)  Wt Readings from Last 3 Encounters:  02/26/20 146 lb (66.2 kg)  01/31/19 173 lb (78.5 kg)  07/03/18 170 lb (77.1 kg) (92 %, Z= 1.38)*   * Growth percentiles are based on CDC (Girls, 2-20 Years) data.        Assessment & Plan:   Problem List Items Addressed This Visit    None    Visit Diagnoses    First trimester pregnancy    -  Primary   Relevant Orders   US OB Transvaginal   Subchorionic hemorrhage of placenta in first trimester, single or unspecified fetus       Relevant Orders   US OB Transvaginal      Plan: We will schedule ultrasound in 2 weeks to reassess subchorionic hemorrhage.  Written and verbal education provided on safe pregnancy behaviors.  Continue prenatal vitamin daily.  Establish care with OB as we do not deliver babies.  Instructed to go to emergency room if she experiences abdominal pain with heavy bleeding.  She is agreeable to plan.    Olivia Mackie [facility],  12:22 PM 02/28/2020

## 2020-02-28 NOTE — Patient Instructions (Signed)
First Trimester of Pregnancy  The first trimester of pregnancy is from week 1 until the end of week 13 (months 1 through 3). During this time, your baby will begin to develop inside you. At 6-8 weeks, the eyes and face are formed, and the heartbeat can be seen on ultrasound. At the end of 12 weeks, all the baby's organs are formed. Prenatal care is all the medical care you receive before the birth of your baby. Make sure you get good prenatal care and follow all of your doctor's instructions. Follow these instructions at home: Medicines  Take over-the-counter and prescription medicines only as told by your doctor. Some medicines are safe and some medicines are not safe during pregnancy.  Take a prenatal vitamin that contains at least 600 micrograms (mcg) of folic acid.  If you have trouble pooping (constipation), take medicine that will make your stool soft (stool softener) if your doctor approves. Eating and drinking   Eat regular, healthy meals.  Your doctor will tell you the amount of weight gain that is right for you.  Avoid raw meat and uncooked cheese.  If you feel sick to your stomach (nauseous) or throw up (vomit): ? Eat 4 or 5 small meals a day instead of 3 large meals. ? Try eating a few soda crackers. ? Drink liquids between meals instead of during meals.  To prevent constipation: ? Eat foods that are high in fiber, like fresh fruits and vegetables, whole grains, and beans. ? Drink enough fluids to keep your pee (urine) clear or pale yellow. Activity  Exercise only as told by your doctor. Stop exercising if you have cramps or pain in your lower belly (abdomen) or low back.  Do not exercise if it is too hot, too humid, or if you are in a place of great height (high altitude).  Try to avoid standing for long periods of time. Move your legs often if you must stand in one place for a long time.  Avoid heavy lifting.  Wear low-heeled shoes. Sit and stand up  straight.  You can have sex unless your doctor tells you not to. Relieving pain and discomfort  Wear a good support bra if your breasts are sore.  Take warm water baths (sitz baths) to soothe pain or discomfort caused by hemorrhoids. Use hemorrhoid cream if your doctor says it is okay.  Rest with your legs raised if you have leg cramps or low back pain.  If you have puffy, bulging veins (varicose veins) in your legs: ? Wear support hose or compression stockings as told by your doctor. ? Raise (elevate) your feet for 15 minutes, 3-4 times a day. ? Limit salt in your food. Prenatal care  Schedule your prenatal visits by the twelfth week of pregnancy.  Write down your questions. Take them to your prenatal visits.  Keep all your prenatal visits as told by your doctor. This is important. Safety  Wear your seat belt at all times when driving.  Make a list of emergency phone numbers. The list should include numbers for family, friends, the hospital, and police and fire departments. General instructions  Ask your doctor for a referral to a local prenatal class. Begin classes no later than at the start of month 6 of your pregnancy.  Ask for help if you need counseling or if you need help with nutrition. Your doctor can give you advice or tell you where to go for help.  Do not use hot tubs, steam   rooms, or saunas.  Do not douche or use tampons or scented sanitary pads.  Do not cross your legs for long periods of time.  Avoid all herbs and alcohol. Avoid drugs that are not approved by your doctor.  Do not use any tobacco products, including cigarettes, chewing tobacco, and electronic cigarettes. If you need help quitting, ask your doctor. You may get counseling or other support to help you quit.  Avoid cat litter boxes and soil used by cats. These carry germs that can cause birth defects in the baby and can cause a loss of your baby (miscarriage) or stillbirth.  Visit your dentist.  At home, brush your teeth with a soft toothbrush. Be gentle when you floss. Contact a doctor if:  You are dizzy.  You have mild cramps or pressure in your lower belly.  You have a nagging pain in your belly area.  You continue to feel sick to your stomach, you throw up, or you have watery poop (diarrhea).  You have a bad smelling fluid coming from your vagina.  You have pain when you pee (urinate).  You have increased puffiness (swelling) in your face, hands, legs, or ankles. Get help right away if:  You have a fever.  You are leaking fluid from your vagina.  You have spotting or bleeding from your vagina.  You have very bad belly cramping or pain.  You gain or lose weight rapidly.  You throw up blood. It may look like coffee grounds.  You are around people who have German measles, fifth disease, or chickenpox.  You have a very bad headache.  You have shortness of breath.  You have any kind of trauma, such as from a fall or a car accident. Summary  The first trimester of pregnancy is from week 1 until the end of week 13 (months 1 through 3).  To take care of yourself and your unborn baby, you will need to eat healthy meals, take medicines only if your doctor tells you to do so, and do activities that are safe for you and your baby.  Keep all follow-up visits as told by your doctor. This is important as your doctor will have to ensure that your baby is healthy and growing well. This information is not intended to replace advice given to you by your health care provider. Make sure you discuss any questions you have with your health care provider. Document Revised: 09/14/2018 Document Reviewed: 06/01/2016 Elsevier Patient Education  2020 [employer]  

## 2020-03-03 ENCOUNTER — Telehealth: Payer: Self-pay

## 2020-03-03 NOTE — Telephone Encounter (Signed)
I would recommend not lifting more than 20 pounds due to pregnancy. If her employee needs a note on top of her reporting that she is pregnant then please send this in for her. Thank you

## 2020-03-03 NOTE — Telephone Encounter (Signed)
Spoke with patient's mom (Per DPR access note on file) and emailed the letter to her and advised her.. Mom was aware that patient had requested letter. Patient had gone to work per [employer].

## 2020-03-03 NOTE — Telephone Encounter (Signed)
Patient said at visit you advised "no more lifting".  She is calling today to ask what the weight limit is for lifting? And she her work is asking for a medical note to excuse her lifting.  ( I can write the letter for you if you tell me the specifics.)

## 2020-03-05 ENCOUNTER — Other Ambulatory Visit: Payer: Self-pay | Admitting: Nurse Practitioner

## 2020-03-05 ENCOUNTER — Telehealth: Payer: Self-pay

## 2020-03-05 DIAGNOSIS — R112 Nausea with vomiting, unspecified: Secondary | ICD-10-CM

## 2020-03-05 MED ORDER — DOXYLAMINE-PYRIDOXINE 10-10 MG PO TBEC: DELAYED_RELEASE_TABLET | ORAL | 1 refills | Status: DC

## 2020-03-05 NOTE — Telephone Encounter (Signed)
Patient called to ask if you could prescribe the nausea medication for her that ER doctor at [facility] prescribed.  Rx / DC Orders    ED Discharge Orders               Ordered     metoCLOPramide (REGLAN) 5 MG tablet  3 times daily        02/26/20 1609

## 2020-03-05 NOTE — Telephone Encounter (Signed)
I called patient back at phone number she left but it was mom, Sarah Rollins's number. I got her voice mail and I read her TW's recommendation paragraph. I told her if any questions to call back and gave her the direct ph # for triage line.

## 2020-03-05 NOTE — Telephone Encounter (Signed)
Spoke with patient's mom and read her TW's recommendation note regarding Rx for Diclegis.  Reviewed directions with her.

## 2020-03-05 NOTE — Telephone Encounter (Signed)
Mom called back in voice mail after receiving my message. She said patient is having severe vomiting and unable to keep food down.  She said patient has tried most the things you recommended. SHe wants Rx.

## 2020-03-05 NOTE — Telephone Encounter (Signed)
Sarah Rollins should only be used temporarily for nausea during pregnancy unless severe. I recommend not letting Sarah Rollins get hungry and eat small snacks/meals every 1-2 hours that are high-protein, high carb, or bland/dry. Add ginger to her diet whether it is Gingerale/ginger candy and peppermint also helps. Vitamin B6 25 mg every 6 hours. If vomiting occurs and she is unable to keep foods down we can consider medication.

## 2020-03-05 NOTE — Telephone Encounter (Signed)
I will send in Diclegis which is a safe medication specifically for pregnancy-induced nausea/vomiting. It can be pricey depending on insurance so she may need to download Good Rx and see if she can find it cheaper at local pharmacies or look online for coupons if this is a problem. It is an extended-release medication. For the first 2 days she should take 2 tablets at bedtime (it may cause drowsiness), if symptoms persist take 1 tablet in the morning and 2 tablets at bedtime, if symptoms still persist she can take 1 in the morning, 1 in the afternoon, and 2 at bedtime for a total of 4 tablets per day (this is the max she can take).

## 2020-03-12 ENCOUNTER — Ambulatory Visit: Payer: No Typology Code available for payment source | Admitting: Nurse Practitioner

## 2020-03-12 ENCOUNTER — Other Ambulatory Visit: Payer: Self-pay

## 2020-03-12 ENCOUNTER — Encounter: Payer: Self-pay | Admitting: Nurse Practitioner

## 2020-03-12 VITALS — BP 118/78

## 2020-03-12 DIAGNOSIS — O26859 Spotting complicating pregnancy, unspecified trimester: Secondary | ICD-10-CM

## 2020-03-12 NOTE — Progress Notes (Signed)
   Acute Office Visit  Subjective:    Patient ID: Sarah Rollins, female    DOB: 2000-12-28, 19 y.o.   MRN: [id number]   HPI 19 y.o. presents today for vaginal spotting and abdominal pain in first trimester of pregnancy. Currently [redacted]w[redacted]d based on CRL on ultrasound 02/26/2020. Spotting started yesterday and was very light/pink with an episode of darker blood mixed with discharge overnight. Has mild mid lower abdominal pain that comes and and goes and described as cramping and sharp. Had bleeding a few weeks ago around [redacted] weeks gestation with ultrasound showing trace subchorionic hemorrhage, otherwise unremarkable. She has been under a lot of stress due to boyfriend/father of the baby leaving and she works a physical job doing Public affairs consultant at [employer]. She is emotional today. Mother is supportive. Started Diclegis for nausea/vomiting and is doing well on this.    Review of Systems  Constitutional: Negative.   Gastrointestinal: Positive for abdominal pain.  Genitourinary: Positive for vaginal bleeding.       Objective:    Physical Exam Constitutional:      Appearance: Normal appearance.  Abdominal:     Tenderness: There is abdominal tenderness (mid lower with light palpation).  Genitourinary:    General: Normal vulva.     Vagina: Normal. No bleeding.     Cervix: Normal.     BP 118/78 (BP Location: Right Arm, Patient Position: Sitting, Cuff Size: Normal)   LMP 11/27/2019 (Exact Date)  Wt Readings from Last 3 Encounters:  02/26/20 146 lb (66.2 kg)  01/31/19 173 lb (78.5 kg)  07/03/18 170 lb (77.1 kg) (92 %, Z= 1.38)*   * Growth percentiles are based on CDC (Girls, 2-20 Years) data.        Assessment & Plan:   Problem List Items Addressed This Visit    None    Visit Diagnoses    Spotting in early pregnancy    -  Primary      Plan: Ultrasound tomorrow for further evaluation. Support provided on personal situation and recommend rest.     Olivia Mackie [facility] [place],  3:26 PM 03/12/2020

## 2020-03-13 ENCOUNTER — Ambulatory Visit (INDEPENDENT_AMBULATORY_CARE_PROVIDER_SITE_OTHER): Payer: No Typology Code available for payment source

## 2020-03-13 ENCOUNTER — Encounter: Payer: Self-pay | Admitting: Nurse Practitioner

## 2020-03-13 ENCOUNTER — Ambulatory Visit (INDEPENDENT_AMBULATORY_CARE_PROVIDER_SITE_OTHER): Payer: No Typology Code available for payment source | Admitting: Nurse Practitioner

## 2020-03-13 VITALS — BP 115/75

## 2020-03-13 DIAGNOSIS — O468X1 Other antepartum hemorrhage, first trimester: Secondary | ICD-10-CM

## 2020-03-13 DIAGNOSIS — Z3A08 8 weeks gestation of pregnancy: Secondary | ICD-10-CM | POA: Diagnosis not present

## 2020-03-13 DIAGNOSIS — O26851 Spotting complicating pregnancy, first trimester: Secondary | ICD-10-CM

## 2020-03-13 DIAGNOSIS — O418X10 Other specified disorders of amniotic fluid and membranes, first trimester, not applicable or unspecified: Secondary | ICD-10-CM

## 2020-03-13 DIAGNOSIS — Z3491 Encounter for supervision of normal pregnancy, unspecified, first trimester: Secondary | ICD-10-CM

## 2020-03-13 NOTE — Progress Notes (Signed)
History: 19 year old presents today to discuss ultrasound.  She was seen by me yesterday for vaginal spotting and abdominal pain in first trimester of pregnancy.  The spotting occurred for 1 day and was very light/pink with one episode of darker blood mixed with discharge overnight.  The abdominal pain is lower and mild and comes and goes and is described as cramping and sharp.  Was seen in the emergency room a few weeks ago around [redacted] weeks gestation for bleeding where ultrasound showed trace subchorionic hemorrhage but was otherwise unremarkable.  She has been under a lot of stress due to boyfriend/father of the baby leaving and working a physical job.  Exam: Appears well Ultrasound: Anteverted uterus.  Single viable IUP.  CRL less than LMP dates by 1+ weeks. FHR = 175 B/min - WNL for gestational age.  Normal yolk sac.  Cervix long and closed.  Adnexa within normal limits -resolving corpus luteum on left side.  No free fluid, no subchorionic hemorrhage seen on today's scan.  Assessment: Spotting during pregnancy in first trimester [redacted] weeks gestation of pregnancy  Plan: Discussed ultrasound and reassurance provided on normal findings.  Reassurance also provided that spotting and mild abdominal pain during first trimester is normal.  If she experiences heavy bleeding with worsening abdominal pain she needs to go to the emergency room.  She plans to establish care with [facility] as we do not deliver.  Recommend rest.  Continue prenatal vitamin and Diclegis as needed for nausea/vomiting.  She is agreeable to plan.

## 2020-03-20 ENCOUNTER — Ambulatory Visit: Payer: No Typology Code available for payment source | Admitting: Nurse Practitioner

## 2020-03-20 ENCOUNTER — Other Ambulatory Visit: Payer: No Typology Code available for payment source

## 2020-03-31 ENCOUNTER — Other Ambulatory Visit: Payer: Self-pay | Admitting: Nurse Practitioner

## 2020-03-31 ENCOUNTER — Telehealth: Payer: Self-pay

## 2020-03-31 DIAGNOSIS — O219 Vomiting of pregnancy, unspecified: Secondary | ICD-10-CM

## 2020-03-31 MED ORDER — BONJESTA 20-20 MG PO TBCR: 1.0000 | EXTENDED_RELEASE_TABLET | Freq: Two times a day (BID) | ORAL | 0 refills | Status: DC

## 2020-03-31 NOTE — Telephone Encounter (Signed)
I will send in [place]. She can take 1 tablet tonight and repeat in the morning. No more than twice per day.

## 2020-03-31 NOTE — Telephone Encounter (Signed)
Patient said she has already done that and was told it was a Emergency planning/management officer.  She has been without any medication over the weekend.

## 2020-03-31 NOTE — Telephone Encounter (Signed)
I recommend she call other pharmacies to see if they have any and if so, have it transferred. If not, we could try Bonjesta but I am not sure of costs.

## 2020-03-31 NOTE — Telephone Encounter (Signed)
Patient informed. 

## 2020-03-31 NOTE — Telephone Encounter (Signed)
Patient called in voice mail. Medication she was using for nausea with pregnancy is on backorder and she would like another Rx.  (Your last note indicates she was taking Diclegis.)

## 2020-04-07 ENCOUNTER — Telehealth: Payer: Self-pay

## 2020-04-07 NOTE — Telephone Encounter (Signed)
Re: Bonjesta ER 20-20 mg tablet.  "Drug excluded from coverage". "Medication not covered by insurance."

## 2020-04-08 ENCOUNTER — Other Ambulatory Visit: Payer: Self-pay | Admitting: Nurse Practitioner

## 2020-04-08 DIAGNOSIS — O219 Vomiting of pregnancy, unspecified: Secondary | ICD-10-CM

## 2020-04-08 MED ORDER — DOXYLAMINE-PYRIDOXINE 10-10 MG PO TBEC: 1.0000 | DELAYED_RELEASE_TABLET | Freq: Two times a day (BID) | ORAL | 1 refills | Status: DC

## 2020-04-08 NOTE — Telephone Encounter (Signed)
If the Diclegis is still on back order we can do Zofran. Just let me know.

## 2020-04-08 NOTE — Telephone Encounter (Signed)
Sent.  Thank you.

## 2020-04-08 NOTE — Telephone Encounter (Signed)
Per Selena Batten at patient's pharmacy they do have generic Diclegis available but no brand if you want to send that and they can see if insurance will cover it.

## 2020-04-08 NOTE — Telephone Encounter (Signed)
Left message with pharmacy to please let me know status of Diclegis being on back order.

## 2020-04-15 ENCOUNTER — Other Ambulatory Visit: Payer: Self-pay | Admitting: Obstetrics & Gynecology

## 2020-04-15 DIAGNOSIS — Z3682 Encounter for antenatal screening for nuchal translucency: Secondary | ICD-10-CM

## 2020-04-16 ENCOUNTER — Ambulatory Visit (INDEPENDENT_AMBULATORY_CARE_PROVIDER_SITE_OTHER): Payer: Medicaid Other | Admitting: Women's Health

## 2020-04-16 ENCOUNTER — Ambulatory Visit: Payer: Medicaid Other | Admitting: *Deleted

## 2020-04-16 ENCOUNTER — Encounter: Payer: Self-pay | Admitting: Women's Health

## 2020-04-16 ENCOUNTER — Ambulatory Visit (INDEPENDENT_AMBULATORY_CARE_PROVIDER_SITE_OTHER): Payer: Medicaid Other

## 2020-04-16 VITALS — BP 99/67 | HR 64 | Wt 158.0 lb

## 2020-04-16 DIAGNOSIS — Z1389 Encounter for screening for other disorder: Secondary | ICD-10-CM

## 2020-04-16 DIAGNOSIS — Z3400 Encounter for supervision of normal first pregnancy, unspecified trimester: Secondary | ICD-10-CM | POA: Insufficient documentation

## 2020-04-16 DIAGNOSIS — Z3682 Encounter for antenatal screening for nuchal translucency: Secondary | ICD-10-CM

## 2020-04-16 DIAGNOSIS — Z3401 Encounter for supervision of normal first pregnancy, first trimester: Secondary | ICD-10-CM | POA: Diagnosis not present

## 2020-04-16 DIAGNOSIS — Z3A13 13 weeks gestation of pregnancy: Secondary | ICD-10-CM | POA: Diagnosis not present

## 2020-04-16 DIAGNOSIS — Z331 Pregnant state, incidental: Secondary | ICD-10-CM

## 2020-04-16 DIAGNOSIS — F1290 Cannabis use, unspecified, uncomplicated: Secondary | ICD-10-CM | POA: Insufficient documentation

## 2020-04-16 LAB — POCT URINALYSIS DIPSTICK OB
Blood, UA: NEGATIVE
Glucose, UA: NEGATIVE
Ketones, UA: NEGATIVE
Leukocytes, UA: NEGATIVE
Nitrite, UA: NEGATIVE
POC,PROTEIN,UA: NEGATIVE

## 2020-04-16 MED ORDER — BLOOD PRESSURE MONITOR MISC: 0 refills | Status: DC

## 2020-04-16 MED ORDER — PROMETHAZINE HCL 25 MG PO TABS: 12.5000 mg | ORAL_TABLET | Freq: Four times a day (QID) | ORAL | 0 refills | Status: DC | PRN

## 2020-04-16 NOTE — Patient Instructions (Signed)
Sarah Rollins, I greatly value your feedback.  If you receive a survey following your visit with [place] today, we appreciate you taking the time to fill it out.  Thanks, Joellyn Haff, CNM, WHNP-BC   [facility] ([address] [place], [place] [address]) Entrance C, located off of [address] Free 24/7 valet parking   Nausea & Vomiting  Have saltine crackers or pretzels by your bed and eat a few bites before you raise your head out of bed in the morning  Eat small frequent meals throughout the day instead of large meals  Drink plenty of fluids throughout the day to stay hydrated, just don't drink a lot of fluids with your meals.  This can make your stomach fill up faster making you feel sick  Do not brush your teeth right after you eat  Products with real ginger are good for nausea, like ginger ale and ginger hard candy Make sure it says made with real ginger!  Sucking on sour candy like lemon heads is also good for nausea  If your prenatal vitamins make you nauseated, take them at night so you will sleep through the nausea  Sea Bands  If you feel like you need medicine for the nausea & vomiting please let us know  If you are unable to keep any fluids or food down please let us know   Constipation  Drink plenty of fluid, preferably water, throughout the day  Eat foods high in fiber such as fruits, vegetables, and grains  Exercise, such as walking, is a good way to keep your bowels regular  Drink warm fluids, especially warm prune juice, or decaf coffee  Eat a 1/2 cup of real oatmeal (not instant), 1/2 cup applesauce, and 1/2-1 cup warm prune juice every day  If needed, you may take Colace (docusate sodium) stool softener once or twice a day to help keep the stool soft.   If you still are having problems with constipation, you may take Miralax once daily as needed to help keep your bowels regular.   Home Blood Pressure Monitoring for Patients   Your  provider has recommended that you check your blood pressure (BP) at least once a week at home. If you do not have a blood pressure cuff at home, one will be provided for you. Contact your provider if you have not received your monitor within 1 week.   Helpful Tips for Accurate Home Blood Pressure Checks  . Don't smoke, exercise, or drink caffeine 30 minutes before checking your BP . Use the restroom before checking your BP (a full bladder can raise your pressure) . Relax in a comfortable upright chair . Feet on the ground . Left arm resting comfortably on a flat surface at the level of your heart . Legs uncrossed . Back supported . Sit quietly and don't talk . Place the cuff on your bare arm . Adjust snuggly, so that only two fingertips can fit between your skin and the top of the cuff . Check 2 readings separated by at least one minute . Keep a log of your BP readings . For a visual, please reference this diagram: http://ccnc.care/bpdiagram  Provider Name: [facility]     Phone: [phone]  Zone 1: ALL CLEAR  Continue to monitor your symptoms:  . BP reading is less than 140 (top number) or less than 90 (bottom number)  . No right upper stomach pain . No headaches or seeing  spots . No feeling nauseated or throwing up . No swelling in face and hands  Zone 2: CAUTION Call your doctor's office for any of the following:  . BP reading is greater than 140 (top number) or greater than 90 (bottom number)  . Stomach pain under your ribs in the middle or right side . Headaches or seeing spots . Feeling nauseated or throwing up . Swelling in face and hands  Zone 3: EMERGENCY  Seek immediate medical care if you have any of the following:  . BP reading is greater than160 (top number) or greater than 110 (bottom number) . Severe headaches not improving with Tylenol . Serious difficulty catching your breath . Any worsening symptoms from Zone 2    First Trimester of Pregnancy The  first trimester of pregnancy is from week 1 until the end of week 12 (months 1 through 3). A week after a sperm fertilizes an egg, the egg will implant on the wall of the uterus. This embryo will begin to develop into a baby. Genes from you and your partner are forming the baby. The female genes determine whether the baby is a boy or a girl. At 6-8 weeks, the eyes and face are formed, and the heartbeat can be seen on ultrasound. At the end of 12 weeks, all the baby's organs are formed.  Now that you are pregnant, you will want to do everything you can to have a healthy baby. Two of the most important things are to get good prenatal care and to follow your health care provider's instructions. Prenatal care is all the medical care you receive before the baby's birth. This care will help prevent, find, and treat any problems during the pregnancy and childbirth. BODY CHANGES Your body goes through many changes during pregnancy. The changes vary from woman to woman.   You may gain or lose a couple of pounds at first.  You may feel sick to your stomach (nauseous) and throw up (vomit). If the vomiting is uncontrollable, call your health care provider.  You may tire easily.  You may develop headaches that can be relieved by medicines approved by your health care provider.  You may urinate more often. Painful urination may mean you have a bladder infection.  You may develop heartburn as a result of your pregnancy.  You may develop constipation because certain hormones are causing the muscles that push waste through your intestines to slow down.  You may develop hemorrhoids or swollen, bulging veins (varicose veins).  Your breasts may begin to grow larger and become tender. Your nipples may stick out more, and the tissue that surrounds them (areola) may become darker.  Your gums may bleed and may be sensitive to brushing and flossing.  Dark spots or blotches (chloasma, mask of pregnancy) may develop on  your face. This will likely fade after the baby is born.  Your menstrual periods will stop.  You may have a loss of appetite.  You may develop cravings for certain kinds of food.  You may have changes in your emotions from day to day, such as being excited to be pregnant or being concerned that something may go wrong with the pregnancy and baby.  You may have more vivid and strange dreams.  You may have changes in your hair. These can include thickening of your hair, rapid growth, and changes in texture. Some women also have hair loss during or after pregnancy, or hair that feels dry or thin. Your hair  will most likely return to normal after your baby is born. WHAT TO EXPECT AT YOUR PRENATAL VISITS During a routine prenatal visit:  You will be weighed to make sure you and the baby are growing normally.  Your blood pressure will be taken.  Your abdomen will be measured to track your baby's growth.  The fetal heartbeat will be listened to starting around week 10 or 12 of your pregnancy.  Test results from any previous visits will be discussed. Your health care provider may ask you:  How you are feeling.  If you are feeling the baby move.  If you have had any abnormal symptoms, such as leaking fluid, bleeding, severe headaches, or abdominal cramping.  If you have any questions. Other tests that may be performed during your first trimester include:  Blood tests to find your blood type and to check for the presence of any previous infections. They will also be used to check for low iron levels (anemia) and Rh antibodies. Later in the pregnancy, blood tests for diabetes will be done along with other tests if problems develop.  Urine tests to check for infections, diabetes, or protein in the urine.  An ultrasound to confirm the proper growth and development of the baby.  An amniocentesis to check for possible genetic problems.  Fetal screens for spina bifida and Down  syndrome.  You may need other tests to make sure you and the baby are doing well. HOME CARE INSTRUCTIONS  Medicines  Follow your health care provider's instructions regarding medicine use. Specific medicines may be either safe or unsafe to take during pregnancy.  Take your prenatal vitamins as directed.  If you develop constipation, try taking a stool softener if your health care provider approves. Diet  Eat regular, well-balanced meals. Choose a variety of foods, such as meat or vegetable-based protein, fish, milk and low-fat dairy products, vegetables, fruits, and whole grain breads and cereals. Your health care provider will help you determine the amount of weight gain that is right for you.  Avoid raw meat and uncooked cheese. These carry germs that can cause birth defects in the baby.  Eating four or five small meals rather than three large meals a day may help relieve nausea and vomiting. If you start to feel nauseous, eating a few soda crackers can be helpful. Drinking liquids between meals instead of during meals also seems to help nausea and vomiting.  If you develop constipation, eat more high-fiber foods, such as fresh vegetables or fruit and whole grains. Drink enough fluids to keep your urine clear or pale yellow. Activity and Exercise  Exercise only as directed by your health care provider. Exercising will help you:  Control your weight.  Stay in shape.  Be prepared for labor and delivery.  Experiencing pain or cramping in the lower abdomen or low back is a good sign that you should stop exercising. Check with your health care provider before continuing normal exercises.  Try to avoid standing for long periods of time. Move your legs often if you must stand in one place for a long time.  Avoid heavy lifting.  Wear low-heeled shoes, and practice good posture.  You may continue to have sex unless your health care provider directs you otherwise. Relief of Pain or  Discomfort  Wear a good support bra for breast tenderness.    Take warm sitz baths to soothe any pain or discomfort caused by hemorrhoids. Use hemorrhoid cream if your health care provider approves.  Rest with your legs elevated if you have leg cramps or low back pain.  If you develop varicose veins in your legs, wear support hose. Elevate your feet for 15 minutes, 3-4 times a day. Limit salt in your diet. Prenatal Care  Schedule your prenatal visits by the twelfth week of pregnancy. They are usually scheduled monthly at first, then more often in the last 2 months before delivery.  Write down your questions. Take them to your prenatal visits.  Keep all your prenatal visits as directed by your health care provider. Safety  Wear your seat belt at all times when driving.  Make a list of emergency phone numbers, including numbers for family, friends, the hospital, and police and fire departments. General Tips  Ask your health care provider for a referral to a local prenatal education class. Begin classes no later than at the beginning of month 6 of your pregnancy.  Ask for help if you have counseling or nutritional needs during pregnancy. Your health care provider can offer advice or refer you to specialists for help with various needs.  Do not use hot tubs, steam rooms, or saunas.  Do not douche or use tampons or scented sanitary pads.  Do not cross your legs for long periods of time.  Avoid cat litter boxes and soil used by cats. These carry germs that can cause birth defects in the baby and possibly loss of the fetus by miscarriage or stillbirth.  Avoid all smoking, herbs, alcohol, and medicines not prescribed by your health care provider. Chemicals in these affect the formation and growth of the baby.  Schedule a dentist appointment. At home, brush your teeth with a soft toothbrush and be gentle when you floss. SEEK MEDICAL CARE IF:   You have dizziness.  You have mild  pelvic cramps, pelvic pressure, or nagging pain in the abdominal area.  You have persistent nausea, vomiting, or diarrhea.  You have a bad smelling vaginal discharge.  You have pain with urination.  You notice increased swelling in your face, hands, legs, or ankles. SEEK IMMEDIATE MEDICAL CARE IF:   You have a fever.  You are leaking fluid from your vagina.  You have spotting or bleeding from your vagina.  You have severe abdominal cramping or pain.  You have rapid weight gain or loss.  You vomit blood or material that looks like coffee grounds.  You are exposed to [place] measles and have never had them.  You are exposed to fifth disease or chickenpox.  You develop a severe headache.  You have shortness of breath.  You have any kind of trauma, such as from a fall or a car accident. Document Released: 05/18/2001 Document Revised: 10/08/2013 Document Reviewed: 04/03/2013 [facility] Patient Information 2015 [place], [place]. This information is not intended to replace advice given to you by your health care provider. Make sure you discuss any questions you have with your health care provider.

## 2020-04-16 NOTE — Progress Notes (Signed)
[place] 13 wks,measurements c/w dates,crl 73.99 mm,FHR 158 bpm,anterior placenta gr 0,normal ovaries,NT 1.5 mm,NB present

## 2020-04-16 NOTE — Progress Notes (Signed)
INITIAL OBSTETRICAL VISIT Patient name: Sarah Rollins MRN [id number]  Date of birth: 22-Aug-2000 Chief Complaint:   Initial Prenatal Visit (nt/it, nausea)  History of Present Illness:   Sarah Rollins is a 19 y.o. G19P0000 Caucasian female at [redacted]w[redacted]d by [place] at 5 weeks with an Estimated Date of Delivery: 10/22/20 being seen today for her initial obstetrical visit.   Her obstetrical history is significant for primigravida.   Today she reports n/v, pharmacy said diclegis on national backorder, has been doing otc dramamine, but having to take more often than should.  Depression screen [facility] 2/9 04/16/2020  Decreased Interest 0  Down, Depressed, Hopeless 0  PHQ - 2 Score 0  Altered sleeping 1  Tired, decreased energy 1  Change in appetite 3  Feeling bad or failure about yourself  0  Trouble concentrating 0  Moving slowly or fidgety/restless 0  Suicidal thoughts 0  PHQ-9 Score 5    Patient's last menstrual period was 11/27/2019 (exact date). Last pap never- wants to do next visit. Results were: n/a Review of Systems:   Pertinent items are noted in HPI Denies cramping/contractions, leakage of fluid, vaginal bleeding, abnormal vaginal discharge w/ itching/odor/irritation, headaches, visual changes, shortness of breath, chest pain, abdominal pain, severe nausea/vomiting, or problems with urination or bowel movements unless otherwise stated above.  Pertinent History Reviewed:  Reviewed past medical,surgical, social, obstetrical and family history.  Reviewed problem list, medications and allergies. OB History  Gravida Para Term Preterm AB Living  1 0 0 0 0 0  SAB TAB Ectopic Multiple Live Births  0 0 0 0 0    # Outcome Date GA Lbr Len/2nd Weight Sex Delivery Anes PTL Lv  1 Current            Physical Assessment:   Vitals:   04/16/20 0911  BP: 99/67  Pulse: 64  Weight: 158 lb (71.7 kg)  Body mass index is 31.91 kg/m.       Physical Examination:  General appearance - well appearing, and in  no distress  Mental status - alert, oriented to person, place, and time  Psych:  She has a normal mood and affect  Skin - warm and dry, normal color, no suspicious lesions noted  Chest - effort normal, all lung fields clear to auscultation bilaterally  Heart - normal rate and regular rhythm  Abdomen - soft, nontender  Extremities:  No swelling or varicosities noted  Thin prep pap is not done   Chaperone: N/A    TODAY'S NT [place] 13 wks,measurements c/w dates,crl 73.99 mm,FHR 158 bpm,anterior placenta gr 0,normal ovaries,NT 1.5 mm,NB present   Results for orders placed or performed in visit on 04/16/20 (from the past 24 hour(s))  POC Urinalysis Dipstick OB   Collection Time: 04/16/20  9:39 AM  Result Value Ref Range   Color, UA     Clarity, UA     Glucose, UA Negative Negative   Bilirubin, UA     Ketones, UA neg    Spec Grav, UA     Blood, UA neg    pH, UA     POC,PROTEIN,UA Negative Negative, Trace, Small (1+), Moderate (2+), Large (3+), 4+   Urobilinogen, UA     Nitrite, UA neg    Leukocytes, UA Negative Negative   Appearance     Odor      Assessment & Plan:  1) Low-Risk Pregnancy G1P0000 at [redacted]w[redacted]d with an Estimated Date of Delivery: 10/22/20   2) Initial OB visit  3) N/V> rx phenergan  Meds:  Meds ordered this encounter  Medications  . Blood Pressure Monitor MISC    Sig: For regular home bp monitoring during pregnancy    Dispense:  1 each    Refill:  0    Z34.81    Initial labs obtained Continue prenatal vitamins Reviewed n/v relief measures and warning s/s to report Reviewed recommended weight gain based on pre-gravid BMI Encouraged well-balanced diet Genetic & carrier screening discussed: requests Panorama, NT/IT and Horizon 14  Ultrasound discussed; fetal survey: requested CCNC completed> form faxed if has or is planning to apply for medicaid The nature of [employer] for [employer] with multiple MDs and other Advanced Practice Providers was  explained to patient; also emphasized that fellows, residents, and students are part of our team. Does not have home bp cuff. Rx faxed to [facility]. Check bp weekly, let us know if >140/90.  NFPartnership offered, accepted, referral faxed   Follow-up: Return in about 3 weeks (around 05/07/2020) for LROB w/ pap, 2nd IT, in person, CNM.   Orders Placed This Encounter  Procedures  . Urine Culture  . GC/Chlamydia Probe Amp  . Integrated 1  . Genetic Screening  . Pain Management Screening Profile (10S)  . CBC/D/Plt+RPR+Rh+ABO+Rub Ab...  . POC Urinalysis Dipstick OB    Cheral Marker CNM, [facility] 04/16/2020 9:59 AM

## 2020-04-17 LAB — INTEGRATED 1

## 2020-04-18 LAB — CBC/D/PLT+RPR+RH+ABO+RUB AB...
Antibody Screen: NEGATIVE
Basophils Absolute: 0 10*3/uL (ref 0.0–0.2)
Basos: 0 %
EOS (ABSOLUTE): 0 10*3/uL (ref 0.0–0.4)
Eos: 0 %
HCV Ab: <0.1 s/co ratio (ref 0.0–0.9)
HIV Screen 4th Generation wRfx: NONREACTIVE
Hematocrit: 37.9 % (ref 34.0–46.6)
Hemoglobin: 13.3 g/dL (ref 11.1–15.9)
Hepatitis B Surface Ag: NEGATIVE
Immature Grans (Abs): 0.1 10*3/uL (ref 0.0–0.1)
Immature Granulocytes: 1 %
Lymphocytes Absolute: 1.6 10*3/uL (ref 0.7–3.1)
Lymphs: 14 %
MCH: 32.6 pg (ref 26.6–33.0)
MCHC: 35.1 g/dL (ref 31.5–35.7)
MCV: 93 fL (ref 79–97)
Monocytes Absolute: 0.6 10*3/uL (ref 0.1–0.9)
Monocytes: 5 %
Neutrophils Absolute: 9.2 10*3/uL — ABNORMAL HIGH (ref 1.4–7.0)
Neutrophils: 80 %
Platelets: 291 10*3/uL (ref 150–450)
RBC: 4.08 x10E6/uL (ref 3.77–5.28)
RDW: 12.2 % (ref 11.7–15.4)
RPR Ser Ql: NONREACTIVE
Rh Factor: POSITIVE
Rubella Antibodies, IGG: 2.5 index (ref >0.99)
WBC: 11.5 10*3/uL — ABNORMAL HIGH (ref 3.4–10.8)

## 2020-04-18 LAB — INTEGRATED 1
Crown Rump Length: 74 mm
Gest. Age on Collection Date: 13.3 weeks
Maternal Age at EDD: 22.1 yr
Nuchal Translucency (NT): 1.5 mm
Number of Fetuses: 1
PAPP-A Value: 976.1 ng/mL
Weight: 158 [lb_av]

## 2020-04-18 LAB — HCV INTERPRETATION

## 2020-04-19 LAB — GC/CHLAMYDIA PROBE AMP
Chlamydia trachomatis, NAA: POSITIVE — AB
Neisseria Gonorrhoeae by PCR: NEGATIVE

## 2020-04-19 LAB — URINE CULTURE

## 2020-04-21 ENCOUNTER — Encounter: Payer: Self-pay | Admitting: Women's Health

## 2020-04-21 ENCOUNTER — Other Ambulatory Visit: Payer: Self-pay | Admitting: Women's Health

## 2020-04-21 DIAGNOSIS — A749 Chlamydial infection, unspecified: Secondary | ICD-10-CM | POA: Insufficient documentation

## 2020-04-21 MED ORDER — AZITHROMYCIN 500 MG PO TABS: 1000.0000 mg | ORAL_TABLET | Freq: Once | ORAL | 0 refills | Status: AC

## 2020-04-22 ENCOUNTER — Telehealth: Payer: Self-pay | Admitting: Women's Health

## 2020-04-22 ENCOUNTER — Encounter: Payer: Self-pay | Admitting: *Deleted

## 2020-04-22 LAB — PMP SCREEN PROFILE (10S), URINE
Amphetamine Scrn, Ur: NEGATIVE ng/mL
BARBITURATE SCREEN URINE: NEGATIVE ng/mL
BENZODIAZEPINE SCREEN, URINE: NEGATIVE ng/mL
CANNABINOIDS UR QL SCN: POSITIVE ng/mL — AB
Cocaine (Metab) Scrn, Ur: NEGATIVE ng/mL
Creatinine(Crt), U: 64.8 mg/dL (ref 20.0–300.0)
Methadone Screen, Urine: NEGATIVE ng/mL
OXYCODONE+OXYMORPHONE UR QL SCN: NEGATIVE ng/mL
Opiate Scrn, Ur: NEGATIVE ng/mL
Ph of Urine: 8.9 (ref 4.5–8.9)
Phencyclidine Qn, Ur: NEGATIVE ng/mL
Propoxyphene Scrn, Ur: NEGATIVE ng/mL

## 2020-04-22 NOTE — Telephone Encounter (Signed)
Sarah Rollins's med [facility] [place]. Pt aware. JSY

## 2020-04-22 NOTE — Telephone Encounter (Signed)
Patient called stating that she has the information of her significant other Name: Sarah Rollins DOB: 01/18/1996, he has no allergies and [facility] [address] in [place]. Please contact patient when medication has been sent to pharmacy.

## 2020-04-22 NOTE — Telephone Encounter (Signed)
Azithromycin 1gm PO x 1 called in for Lynelle Smoke DOB: 01/18/1998 to [facility] [address] [place].   Cheral Marker, CNM, [facility] 04/22/2020 3:15 PM

## 2020-05-06 ENCOUNTER — Telehealth: Payer: Self-pay | Admitting: Women's Health

## 2020-05-06 ENCOUNTER — Telehealth: Payer: Self-pay | Admitting: *Deleted

## 2020-05-06 NOTE — Telephone Encounter (Signed)
Patient informed I have resent the results to her email.  Verbalized understanding.

## 2020-05-06 NOTE — Telephone Encounter (Signed)
Patient called stating that she would like to know the results of her Blood work gender determination. Please contact pt

## 2020-05-07 ENCOUNTER — Ambulatory Visit (INDEPENDENT_AMBULATORY_CARE_PROVIDER_SITE_OTHER): Payer: Medicaid Other | Admitting: Advanced Practice Midwife

## 2020-05-07 ENCOUNTER — Other Ambulatory Visit: Payer: Self-pay

## 2020-05-07 ENCOUNTER — Other Ambulatory Visit (HOSPITAL_COMMUNITY)
Admission: RE | Admit: 2020-05-07 | Discharge: 2020-05-07 | Disposition: A | Discharge status: Home / Self Care | Payer: Medicaid Other | Source: Ambulatory Visit | Attending: Advanced Practice Midwife | Admitting: Advanced Practice Midwife

## 2020-05-07 VITALS — BP 134/81 | HR 90 | Wt 159.2 lb

## 2020-05-07 DIAGNOSIS — Z1389 Encounter for screening for other disorder: Secondary | ICD-10-CM

## 2020-05-07 DIAGNOSIS — A749 Chlamydial infection, unspecified: Secondary | ICD-10-CM

## 2020-05-07 DIAGNOSIS — Z1379 Encounter for other screening for genetic and chromosomal anomalies: Secondary | ICD-10-CM

## 2020-05-07 DIAGNOSIS — Z331 Pregnant state, incidental: Secondary | ICD-10-CM

## 2020-05-07 DIAGNOSIS — Z3A16 16 weeks gestation of pregnancy: Secondary | ICD-10-CM

## 2020-05-07 DIAGNOSIS — Z01419 Encounter for gynecological examination (general) (routine) without abnormal findings: Secondary | ICD-10-CM | POA: Insufficient documentation

## 2020-05-07 DIAGNOSIS — Z363 Encounter for antenatal screening for malformations: Secondary | ICD-10-CM

## 2020-05-07 DIAGNOSIS — Z3402 Encounter for supervision of normal first pregnancy, second trimester: Secondary | ICD-10-CM

## 2020-05-07 LAB — POCT URINALYSIS DIPSTICK OB
Blood, UA: NEGATIVE
Glucose, UA: NEGATIVE
Ketones, UA: NEGATIVE
Leukocytes, UA: NEGATIVE
Nitrite, UA: NEGATIVE
POC,PROTEIN,UA: NEGATIVE

## 2020-05-07 MED ORDER — CLOTRIMAZOLE 1 % EX CREA: 1.0000 "application " | TOPICAL_CREAM | Freq: Two times a day (BID) | CUTANEOUS | 1 refills | Status: DC

## 2020-05-07 NOTE — Patient Instructions (Signed)

## 2020-05-07 NOTE — Progress Notes (Signed)
LOW-RISK PREGNANCY VISIT Patient name: Sarah Rollins MRN [id number]  Date of birth: [birth year] Chief Complaint:   Routine Prenatal Visit  History of Present Illness:   Sarah Rollins is a 19 y.o. G67P0000 female at [redacted]w[redacted]d with an Estimated Date of Delivery: 10/22/20 being seen today for ongoing management of a low-risk pregnancy.  Today she reports having ringworm on her arm and now it has spread to her mons and groin; reports Diclegis on backorder and still with N/V. Contractions: Not present. Vag. Bleeding: None.  Movement: Present. denies leaking of fluid. Review of Systems:   Pertinent items are noted in HPI Denies abnormal vaginal discharge w/ itching/odor/irritation, headaches, visual changes, shortness of breath, chest pain, abdominal pain, severe nausea/vomiting, or problems with urination or bowel movements unless otherwise stated above. Pertinent History Reviewed:  Reviewed past medical,surgical, social, obstetrical and family history.  Reviewed problem list, medications and allergies. Physical Assessment:   Vitals:   05/07/20 1354  BP: 134/81  Pulse: 90  Weight: 159 lb 3.2 oz (72.2 kg)  Body mass index is 32.15 kg/m.        Physical Examination:   General appearance: Well appearing, and in no distress  Mental status: Alert, oriented to person, place, and time  Skin: Warm & dry  Cardiovascular: Normal heart rate noted  Respiratory: Normal respiratory effort, no distress  Abdomen: Soft, gravid, nontender  Pelvic: Cervical exam deferred ; multiple patches of ringworm on mons/groin; Pap with cultures collected        Extremities: Edema: Trace  Fetal Status: Fetal Heart Rate (bpm): 152 u/s   Movement: Present    Results for orders placed or performed in visit on 05/07/20 (from the past 24 hour(s))  POC Urinalysis Dipstick OB   Collection Time: 05/07/20  1:55 PM  Result Value Ref Range   Color, UA     Clarity, UA     Glucose, UA Negative Negative   Bilirubin, UA      Ketones, UA neg    Spec Grav, UA     Blood, UA neg    pH, UA     POC,PROTEIN,UA Negative Negative, Trace, Small (1+), Moderate (2+), Large (3+), 4+   Urobilinogen, UA     Nitrite, UA neg    Leukocytes, UA Negative Negative   Appearance     Odor      Assessment & Plan:  1) Low-risk pregnancy G1P0000 at [redacted]w[redacted]d with an Estimated Date of Delivery: 10/22/20   2) Recent chlam infection, TOC today  3) Tinea corporis, rx clotrimazole sent in  4) N/V of preg, Diclegis was helping but it is on a backorder; gave info to replicate dosing with Unisom and B6   Meds:  Meds ordered this encounter  Medications  . clotrimazole (CLOTRIMAZOLE ANTI-FUNGAL) 1 % cream    Sig: Apply 1 application topically 2 (two) times daily.    Dispense:  30 g    Refill:  1    Order Specific Question:   Supervising Provider    Answer:   Lazaro Arms [[id number]]   Labs/procedures today: Pap & cultures, 2nd IT  Plan:  Continue routine obstetrical care   Reviewed: Preterm labor symptoms and general obstetric precautions including but not limited to vaginal bleeding, contractions, leaking of fluid and fetal movement were reviewed in detail with the patient.  All questions were answered. Didn't ask about home bp cuff.  Check bp weekly, let us know if >140/90.   Follow-up: Return in about 3 weeks (  around 05/28/2020) for LROB, [place]: Anatomy, in person.  Orders Placed This Encounter  Procedures  . US OB Comp + 14 Wk  . INTEGRATED 2  . POC Urinalysis Dipstick OB   [name] [facility] 05/07/2020 2:16 PM

## 2020-05-09 LAB — INTEGRATED 2
AFP MoM: 1.05
Alpha-Fetoprotein: 33.5 ng/mL
Crown Rump Length: 74 mm
DIA MoM: 0.93
DIA Value: 143.3 pg/mL
Estriol, Unconjugated: 0.93 ng/mL
Gest. Age on Collection Date: 13.3 weeks
Gestational Age: 16.3 weeks
Maternal Age at EDD: 22.1 yr
Nuchal Translucency (NT): 1.5 mm
Nuchal Translucency MoM: 0.88
Number of Fetuses: 1
PAPP-A MoM: 0.77
PAPP-A Value: 976.1 ng/mL
Test Results:: NEGATIVE
Weight: 158 [lb_av]
Weight: 158 [lb_av]
hCG MoM: 0.42
hCG Value: 14 IU/mL
uE3 MoM: 0.95

## 2020-05-12 ENCOUNTER — Other Ambulatory Visit: Payer: Self-pay | Admitting: Women's Health

## 2020-05-14 ENCOUNTER — Encounter: Payer: Self-pay | Admitting: Advanced Practice Midwife

## 2020-05-14 DIAGNOSIS — R87619 Unspecified abnormal cytological findings in specimens from cervix uteri: Secondary | ICD-10-CM | POA: Insufficient documentation

## 2020-05-14 DIAGNOSIS — R87610 Atypical squamous cells of undetermined significance on cytologic smear of cervix (ASC-US): Secondary | ICD-10-CM | POA: Insufficient documentation

## 2020-05-14 LAB — CYTOLOGY - PAP
Chlamydia: NEGATIVE
Comment: NEGATIVE
Comment: NEGATIVE
Comment: NORMAL
Diagnosis: UNDETERMINED — AB
High risk HPV: NEGATIVE
Neisseria Gonorrhea: NEGATIVE

## 2020-05-26 ENCOUNTER — Other Ambulatory Visit: Payer: Self-pay | Admitting: Nurse Practitioner

## 2020-05-26 DIAGNOSIS — O219 Vomiting of pregnancy, unspecified: Secondary | ICD-10-CM

## 2020-06-04 ENCOUNTER — Encounter: Payer: Self-pay | Admitting: *Deleted

## 2020-06-04 DIAGNOSIS — Z3402 Encounter for supervision of normal first pregnancy, second trimester: Secondary | ICD-10-CM

## 2020-06-05 ENCOUNTER — Ambulatory Visit (INDEPENDENT_AMBULATORY_CARE_PROVIDER_SITE_OTHER): Payer: Medicaid Other | Admitting: Advanced Practice Midwife

## 2020-06-05 ENCOUNTER — Other Ambulatory Visit: Payer: Self-pay

## 2020-06-05 ENCOUNTER — Ambulatory Visit (INDEPENDENT_AMBULATORY_CARE_PROVIDER_SITE_OTHER): Payer: Medicaid Other

## 2020-06-05 VITALS — BP 126/82 | HR 96 | Wt 194.0 lb

## 2020-06-05 DIAGNOSIS — Z363 Encounter for antenatal screening for malformations: Secondary | ICD-10-CM | POA: Diagnosis not present

## 2020-06-05 DIAGNOSIS — Z3A20 20 weeks gestation of pregnancy: Secondary | ICD-10-CM | POA: Diagnosis not present

## 2020-06-05 DIAGNOSIS — Z1389 Encounter for screening for other disorder: Secondary | ICD-10-CM

## 2020-06-05 DIAGNOSIS — Z331 Pregnant state, incidental: Secondary | ICD-10-CM

## 2020-06-05 LAB — POCT URINALYSIS DIPSTICK OB
Blood, UA: NEGATIVE
Glucose, UA: NEGATIVE
Ketones, UA: NEGATIVE
Leukocytes, UA: NEGATIVE
Nitrite, UA: NEGATIVE
POC,PROTEIN,UA: NEGATIVE

## 2020-06-05 NOTE — Patient Instructions (Signed)
Sarah Rollins, I greatly value your feedback.  If you receive a survey following your visit with [place] today, we appreciate you taking the time to fill it out.  Thanks, Cathie Beams, CNM     [facility] HAS MOVED!!! It is now [facility] ([address] [place], [place] [address]) Entrance located off of [address] Free 24/7 valet parking   Go to [email or web address] to register for FREE online childbirth classes    Second Trimester of Pregnancy The second trimester is from week 14 through week 27 (months 4 through 6). The second trimester is often a time when you feel your best. Your body has adjusted to being pregnant, and you begin to feel better physically. Usually, morning sickness has lessened or quit completely, you may have more energy, and you may have an increase in appetite. The second trimester is also a time when the fetus is growing rapidly. At the end of the sixth month, the fetus is about 9 inches long and weighs about 1 pounds. You will likely begin to feel the baby move (quickening) between 16 and 20 weeks of pregnancy. Body changes during your second trimester Your body continues to go through many changes during your second trimester. The changes vary from woman to woman.  Your weight will continue to increase. You will notice your lower abdomen bulging out.  You may begin to get stretch marks on your hips, abdomen, and breasts.  You may develop headaches that can be relieved by medicines. The medicines should be approved by your health care provider.  You may urinate more often because the fetus is pressing on your bladder.  You may develop or continue to have heartburn as a result of your pregnancy.  You may develop constipation because certain hormones are causing the muscles that push waste through your intestines to slow down.  You may develop hemorrhoids or swollen, bulging veins (varicose veins).  You may have back  pain. This is caused by: ? Weight gain. ? Pregnancy hormones that are relaxing the joints in your pelvis. ? A shift in weight and the muscles that support your balance.  Your breasts will continue to grow and they will continue to become tender.  Your gums may bleed and may be sensitive to brushing and flossing.  Dark spots or blotches (chloasma, mask of pregnancy) may develop on your face. This will likely fade after the baby is born.  A dark line from your belly button to the pubic area (linea nigra) may appear. This will likely fade after the baby is born.  You may have changes in your hair. These can include thickening of your hair, rapid growth, and changes in texture. Some women also have hair loss during or after pregnancy, or hair that feels dry or thin. Your hair will most likely return to normal after your baby is born.  What to expect at prenatal visits During a routine prenatal visit:  You will be weighed to make sure you and the fetus are growing normally.  Your blood pressure will be taken.  Your abdomen will be measured to track your baby's growth.  The fetal heartbeat will be listened to.  Any test results from the previous visit will be discussed.  Your health care provider may ask you:  How you are feeling.  If you are feeling the baby move.  If you have had any abnormal symptoms, such as leaking fluid, bleeding, severe headaches, or abdominal  cramping.  If you are using any tobacco products, including cigarettes, chewing tobacco, and electronic cigarettes.  If you have any questions.  Other tests that may be performed during your second trimester include:  Blood tests that check for: ? Low iron levels (anemia). ? High blood sugar that affects pregnant women (gestational diabetes) between 65 and 28 weeks. ? Rh antibodies. This is to check for a protein on red blood cells (Rh factor).  Urine tests to check for infections, diabetes, or protein in the  urine.  An ultrasound to confirm the proper growth and development of the baby.  An amniocentesis to check for possible genetic problems.  Fetal screens for spina bifida and Down syndrome.  HIV (human immunodeficiency virus) testing. Routine prenatal testing includes screening for HIV, unless you choose not to have this test.  Follow these instructions at home: Medicines  Follow your health care provider's instructions regarding medicine use. Specific medicines may be either safe or unsafe to take during pregnancy.  Take a prenatal vitamin that contains at least 600 micrograms (mcg) of folic acid.  If you develop constipation, try taking a stool softener if your health care provider approves. Eating and drinking  Eat a balanced diet that includes fresh fruits and vegetables, whole grains, good sources of protein such as meat, eggs, or tofu, and low-fat dairy. Your health care provider will help you determine the amount of weight gain that is right for you.  Avoid raw meat and uncooked cheese. These carry germs that can cause birth defects in the baby.  If you have low calcium intake from food, talk to your health care provider about whether you should take a daily calcium supplement.  Limit foods that are high in fat and processed sugars, such as fried and sweet foods.  To prevent constipation: ? Drink enough fluid to keep your urine clear or pale yellow. ? Eat foods that are high in fiber, such as fresh fruits and vegetables, whole grains, and beans. Activity  Exercise only as directed by your health care provider. Most women can continue their usual exercise routine during pregnancy. Try to exercise for 30 minutes at least 5 days a week. Stop exercising if you experience uterine contractions.  Avoid heavy lifting, wear low heel shoes, and practice good posture.  A sexual relationship may be continued unless your health care provider directs you otherwise. Relieving pain and  discomfort  Wear a good support bra to prevent discomfort from breast tenderness.  Take warm sitz baths to soothe any pain or discomfort caused by hemorrhoids. Use hemorrhoid cream if your health care provider approves.  Rest with your legs elevated if you have leg cramps or low back pain.  If you develop varicose veins, wear support hose. Elevate your feet for 15 minutes, 3-4 times a day. Limit salt in your diet. Prenatal Care  Write down your questions. Take them to your prenatal visits.  Keep all your prenatal visits as told by your health care provider. This is important. Safety  Wear your seat belt at all times when driving.  Make a list of emergency phone numbers, including numbers for family, friends, the hospital, and police and fire departments. General instructions  Ask your health care provider for a referral to a local prenatal education class. Begin classes no later than the beginning of month 6 of your pregnancy.  Ask for help if you have counseling or nutritional needs during pregnancy. Your health care provider can offer advice or refer  you to specialists for help with various needs.  Do not use hot tubs, steam rooms, or saunas.  Do not douche or use tampons or scented sanitary pads.  Do not cross your legs for long periods of time.  Avoid cat litter boxes and soil used by cats. These carry germs that can cause birth defects in the baby and possibly loss of the fetus by miscarriage or stillbirth.  Avoid all smoking, herbs, alcohol, and unprescribed drugs. Chemicals in these products can affect the formation and growth of the baby.  Do not use any products that contain nicotine or tobacco, such as cigarettes and e-cigarettes. If you need help quitting, ask your health care provider.  Visit your dentist if you have not gone yet during your pregnancy. Use a soft toothbrush to brush your teeth and be gentle when you floss. Contact a health care provider if:  You  have dizziness.  You have mild pelvic cramps, pelvic pressure, or nagging pain in the abdominal area.  You have persistent nausea, vomiting, or diarrhea.  You have a bad smelling vaginal discharge.  You have pain when you urinate. Get help right away if:  You have a fever.  You are leaking fluid from your vagina.  You have spotting or bleeding from your vagina.  You have severe abdominal cramping or pain.  You have rapid weight gain or weight loss.  You have shortness of breath with chest pain.  You notice sudden or extreme swelling of your face, hands, ankles, feet, or legs.  You have not felt your baby move in over an hour.  You have severe headaches that do not go away when you take medicine.  You have vision changes. Summary  The second trimester is from week 14 through week 27 (months 4 through 6). It is also a time when the fetus is growing rapidly.  Your body goes through many changes during pregnancy. The changes vary from woman to woman.  Avoid all smoking, herbs, alcohol, and unprescribed drugs. These chemicals affect the formation and growth your baby.  Do not use any tobacco products, such as cigarettes, chewing tobacco, and e-cigarettes. If you need help quitting, ask your health care provider.  Contact your health care provider if you have any questions. Keep all prenatal visits as told by your health care provider. This is important. This information is not intended to replace advice given to you by your health care provider. Make sure you discuss any questions you have with your health care provider.

## 2020-06-05 NOTE — Progress Notes (Signed)
[place] 20+1 wks,breech,anterior placenta gr 0,cx 3.5 cm,svp of fluid 4.4 cm,fhr 159 bpm,EFW 324 g 36%,anatomy complete,no obvious abnormalities

## 2020-06-05 NOTE — Progress Notes (Signed)
   LOW-RISK PREGNANCY VISIT Patient name: Sarah Rollins MRN [id number]  Date of birth: [birth year] Chief Complaint:   Routine Prenatal Visit  History of Present Illness:   Sarah Rollins is a 19 y.o. G45P0000 female at [redacted]w[redacted]d with an Estimated Date of Delivery: 10/22/20 being seen today for ongoing management of a low-risk pregnancy.  Today she reports itchy stomach and breasts. . Contractions: Not present. Vag. Bleeding: None.  Movement: Present. denies leaking of fluid. Review of Systems:   Pertinent items are noted in HPI Denies abnormal vaginal discharge w/ itching/odor/irritation, headaches, visual changes, shortness of breath, chest pain, abdominal pain, severe nausea/vomiting, or problems with urination or bowel movements unless otherwise stated above. Pertinent History Reviewed:  Reviewed past medical,surgical, social, obstetrical and family history.  Reviewed problem list, medications and allergies. Physical Assessment:   Vitals:   06/05/20 1418  BP: 126/82  Pulse: 96  Weight: 194 lb (88 kg)  Body mass index is 39.18 kg/m.        Physical Examination:   General appearance: Well appearing, and in no distress  Mental status: Alert, oriented to person, place, and time  Skin: Warm & dry; no rash where she itches (abdomen and breasts)  Cardiovascular: Normal heart rate noted  Respiratory: Normal respiratory effort, no distress  Abdomen: Soft, gravid, nontender  Pelvic: Cervical exam deferred         Extremities: Edema: Mild pitting, slight indentation  Fetal Status:     Movement: Present   [place] 20+1 wks,breech,anterior placenta gr 0,cx 3.5 cm,svp of fluid 4.4 cm,fhr 159 bpm,EFW 324 g 36%,anatomy complete,no obvious abnormalities   Chaperone: n/a    Results for orders placed or performed in visit on 06/05/20 (from the past 24 hour(s))  POC Urinalysis Dipstick OB   Collection Time: 06/05/20  2:16 PM  Result Value Ref Range   Color, UA     Clarity, UA     Glucose, UA Negative  Negative   Bilirubin, UA     Ketones, UA neg    Spec Grav, UA     Blood, UA neg    pH, UA     POC,PROTEIN,UA Negative Negative, Trace, Small (1+), Moderate (2+), Large (3+), 4+   Urobilinogen, UA     Nitrite, UA neg    Leukocytes, UA Negative Negative   Appearance     Odor      Assessment & Plan:  1) Low-risk pregnancy G1P0000 at [redacted]w[redacted]d with an Estimated Date of Delivery: 10/22/20   2) Dry skin itch, emollients/anti-itch creams/ cool cloths prn   Meds: No orders of the defined types were placed in this encounter.  Labs/procedures today: anatomy scan  Plan:  Continue routine obstetrical care  Next visit: prefers online    Reviewed: Preterm labor symptoms and general obstetric precautions including but not limited to vaginal bleeding, contractions, leaking of fluid and fetal movement were reviewed in detail with the patient.  All questions were answered. Has home bp cuff. Check bp weekly, let us know if >140/90.   Follow-up: No follow-ups on file.  Orders Placed This Encounter  Procedures  . POC Urinalysis Dipstick OB   Jacklyn Shell DNP, CNM 06/05/2020 2:49 PM

## 2020-06-07 NOTE — L&D Delivery Note (Addendum)
Delivery Note:   G1P0000 at [redacted]w[redacted]d  Admitting diagnosis: Gestational hypertension without significant proteinuria [O13.9] Risks: none Onset of labor: 5/5 @ 1242   IOL/Augmentation: AROM, Pitocin and IP Foley ROM: AROM @ 1242. Small amount of Mecc stained fluid.  Complete dilation at 10/09/2020  @1933  Onset of pushing at 2045 FHR second stage Cat II reassuring with moderate variability  Analgesia 2046 intrapartum:Epidural  SNM and CNM arrive to room, with fetal head at +3 station. Pt began Pushing in lithotomy position with good maternal efforts.SNM, CNM and L&D staff support at bedside. FOB and maternal grandmother present for birth and supportive.  Delivery of a Live born female  Birth Weight: Pending   APGAR: 16, 9  Newborn Delivery   Birth date/time: 10/09/2020 21:07:00 Delivery type: Vaginal, Spontaneous     Infant delivered in cephalic presentation, in DOA position and resitituted to LOT position. SNM delivered fetal head and shoulders with ease. Infant placed immediately S2S with mom. Infant with good tone, dried and stimulated. Infant began to cry vigorously.    APGAR:1 min-9 , 5 min-9  10 min-  Nuchal Cord: Yes Cord double clamped after cessation of pulsation by SNM , cut by FOB.  Collection of cord blood for typing completed. Cord blood donation-None  Arterial cord blood sample-No    Placenta delivered-Spontaneously, intact with 3 vessels . Uterotonics: IV pitocin Placenta to L&D for disposal. Uterine tone firm with minimal bleeding.   1st degree;Labial  laceration identified.  Episiotomy:None  Local analgesia: epidural   Repair: 1st degree labial, repaired in traditional fashion with good hemostasis.  Est. Blood Loss (mL):125.00   Complications: None   Mom to postpartum.  Baby to Couplet care / Skin to Skin.  Delivery Report:  Review the Delivery Report for details.     Signed: [place] 12/09/2020) Danella Deis, BSN, RNC-OB  Student Nurse-Midwife   10/09/2020   9:58 PM   The above was performed under my direct supervision and guidance.

## 2020-07-03 ENCOUNTER — Telehealth (INDEPENDENT_AMBULATORY_CARE_PROVIDER_SITE_OTHER): Payer: Medicaid Other | Admitting: Advanced Practice Midwife

## 2020-07-03 ENCOUNTER — Other Ambulatory Visit: Payer: Self-pay

## 2020-07-03 VITALS — BP 141/90

## 2020-07-03 DIAGNOSIS — O99322 Drug use complicating pregnancy, second trimester: Secondary | ICD-10-CM | POA: Diagnosis not present

## 2020-07-03 DIAGNOSIS — R87620 Atypical squamous cells of undetermined significance on cytologic smear of vagina (ASC-US): Secondary | ICD-10-CM

## 2020-07-03 DIAGNOSIS — Z3A24 24 weeks gestation of pregnancy: Secondary | ICD-10-CM

## 2020-07-03 DIAGNOSIS — R030 Elevated blood-pressure reading, without diagnosis of hypertension: Secondary | ICD-10-CM | POA: Diagnosis not present

## 2020-07-03 DIAGNOSIS — F909 Attention-deficit hyperactivity disorder, unspecified type: Secondary | ICD-10-CM

## 2020-07-03 DIAGNOSIS — O26892 Other specified pregnancy related conditions, second trimester: Secondary | ICD-10-CM | POA: Diagnosis not present

## 2020-07-03 DIAGNOSIS — O219 Vomiting of pregnancy, unspecified: Secondary | ICD-10-CM

## 2020-07-03 DIAGNOSIS — O99412 Diseases of the circulatory system complicating pregnancy, second trimester: Secondary | ICD-10-CM

## 2020-07-03 DIAGNOSIS — F1290 Cannabis use, unspecified, uncomplicated: Secondary | ICD-10-CM

## 2020-07-03 DIAGNOSIS — R000 Tachycardia, unspecified: Secondary | ICD-10-CM

## 2020-07-03 DIAGNOSIS — Z3402 Encounter for supervision of normal first pregnancy, second trimester: Secondary | ICD-10-CM

## 2020-07-03 MED ORDER — DOXYLAMINE-PYRIDOXINE 10-10 MG PO TBEC: 1.0000 | DELAYED_RELEASE_TABLET | Freq: Two times a day (BID) | ORAL | 6 refills | Status: DC

## 2020-07-03 MED ORDER — ONDANSETRON 4 MG PO TBDP: 4.0000 mg | ORAL_TABLET | Freq: Four times a day (QID) | ORAL | 2 refills | Status: DC | PRN

## 2020-07-03 NOTE — Progress Notes (Signed)
   I connected with Sarah Rollins 07/03/20 at  1:30 PM EST by: MyChart video and verified that I am speaking with the correct person using two identifiers.  Patient is located at home and provider is located at [facility].     The purpose of this virtual visit is to provide medical care while limiting exposure to the novel coronavirus. I discussed the limitations, risks, security and privacy concerns of performing an evaluation and management service by MyChart video and the availability of in person appointments. I also discussed with the patient that there may be a patient responsible charge related to this service. By engaging in this virtual visit, you consent to the provision of healthcare.  Additionally, you authorize for your insurance to be billed for the services provided during this visit.  The patient expressed understanding and agreed to proceed.  The following staff members participated in the virtual visit:  AMANDA RASH    TELE-PRENATAL VISIT NOTE  Subjective:  Sarah Rollins is a 20 y.o. G1P0000 at [redacted]w[redacted]d  for phone visit for ongoing prenatal care.  She is currently monitored for the following issues for this low-risk pregnancy and has Sinus tachycardia; ADHD; Supervision of normal first pregnancy; Marijuana use; Chlamydia; Atypical squamous cell changes of undetermined significance (ASCUS) on cervical cytology with negative high risk human papilloma virus (HPV) test result; and Elevated blood-pressure reading without diagnosis of hypertension on their problem list.  Patient reports no complaints.  Contractions: Not present. Vag. Bleeding: None.  Movement: Present. Denies leaking of fluid.    Probably had covid early January (had all the sx, got exposed but couldn't get to a test) BPs over the past 10 days: 121/72 117/76 125/69   The following portions of the patient's history were reviewed and updated as appropriate: allergies, current medications, past family history, past medical history,  past social history, past surgical history and problem list.   Objective:   Vitals:   07/03/20 1354  BP: (!) 141/90   Self-Obtained  Fetal Status:     Movement: Present     Assessment and Plan:  Pregnancy: G1P0000 at [redacted]w[redacted]d Preterm labor symptoms and general obstetric precautions including but not limited to vaginal bleeding, contractions, leaking of fluid and fetal movement were reviewed in detail with the patient.  Return in about 3 weeks (around 07/24/2020) for PN2/LROB BP check Monday (virtual).  No future appointments.   Time spent on virtual visit: 15 minutes  Jacklyn Shell, CNM

## 2020-07-03 NOTE — Patient Instructions (Addendum)
1. Before your test, do not eat or drink anything for 8-10 hours prior to your  appointment (a small amount of water is allowed and you may take any medicines you normally take). Be sure to drink lots of water the day before. 2. When you arrive, your blood will be drawn for a 'fasting' blood sugar level.  Then you will be given a sweetened carbonated beverage to drink. You should  complete drinking this beverage within five minutes. After finishing the  beverage, you will have your blood drawn exactly 1 and 2 hours later. Having  your blood drawn on time is an important part of this test. A total of three blood  samples will be done. 3. The test takes approximately 2  hours. During the test, do not have anything to  eat or drink. Do not smoke, chew gum (not even sugarless gum) or use breath mints.  4. During the test you should remain close by and seated as much as possible and  avoid walking around. You may want to bring a book or something else to  occupy your time.  5. After your test, you may eat and drink as normal. You may want to bring a snack  to eat after the test is finished. Your provider will advise you as to the results of  this test and any follow-up if necessary  If your sugar test is positive for gestational diabetes, you will be given an phone call and further instructions discussed. If you wish to know all of your test results before your next appointment, feel free to call the office, or look up your test results on Mychart.  (The range that the lab uses for normal values of the sugar test are not necessarily the range that is used for pregnant women; if your results are within the normal range, they are definitely normal.  However, if a value is deemed "high" by the lab, it may not be too high for a pregnant woman.  We will need to discuss the results if your value(s) fall in the "high" category).     Tdap Vaccine  It is recommended that you get the Tdap vaccine during the  third trimester of EACH pregnancy to help protect your baby from getting pertussis (whooping cough)  27-36 weeks is the BEST time to do this so that you can pass the protection on to your baby. During pregnancy is better than after pregnancy, but if you are unable to get it during pregnancy it will be offered at the hospital.  You will be offered this vaccine in the office after 27 weeks.  If you do not have health insurance, you can get the vaccine from the [facility] Department (no appointment needed).   Everyone who will be around your baby should also be up-to-date on their vaccines. Adults (who are not pregnant) only need 1 dose of Tdap during adulthood.   [facility] Call to Register: [phone] or [phone]   or   Register Online: [email or web address] THESE CLASSES FILL UP VERY QUICKLY, SO SIGN UP AS SOON AS YOU CAN!!! **Please visit [facility] pregnancy website at [email or web address]/** Childbirth Classes  Option 1: Birth & Baby Series ? Series of 3 weekly classes, on the same day of the week (can choose Mon-Thurs) from 6-9pm ? Helps you and your support person prepare for childbirth ? Reviews newborn care, labor & birth, cesarean birth, pain management, and comfort techniques ? Cost: $60  per couple for insured or self-pay, $30 per couple for Medicaid  Option 2: Weekend Birth & Baby ? This class is a weekend version of our Birth & Baby series.  It is designed for parents who have a difficult time fitting several weeks of classes into their schedule.   ? Covers the care of your newborn and the basics of labor and childbirth ? Friday 6:30pm-8:30pm Saturday 9am-4pm, includes lunch for you and your partner  ? Cost: $75 per couple for insured or self-pay, $30 per couple for Medicaid  Option 3: Natural Childbirth ? This series of 5 weekly classes is for expectant parents who want to learn and practice natural methods of coping with  the process of labor and childbirth.  Can choose Mon or Tues, 7-9pm.   ? Covers relaxation, breathing, massage, visualization, role of the partner, and helpful positioning ? Participants learn how to be confident in their body's ability to give birth. Class empowers and helps parents make informed decisions about care. Includes discussion that will help new parents transition into the immediate postpartum period.  ? Cost: $75 per couple for insured or self-pay, $30 per couple for Medicaid  Option 4: Online Birth & Baby ? This online class offers you the freedom to complete a Birth & Baby series in the comfort of your own home.  The flexibility of this option allows you to review sections at your own pace, at times convenient to you and your support people.  It includes additional video information, animations, quizzes and extended activities. Get organized with helpful eClass tools, checklists, and trackers.  ? Cost: $60 for 60 days of online access                                                                            Other Available Classes  Baby & Me Enjoy this time to discuss newborn & infant parenting topics and family adjustment issues with other new mothers in a relaxed environment. Each week brings a new speaker or baby-centered activity. We encourage mothers and their babies (birth to crawling) to join [place]. You are welcome to visit this group even if you haven't delivered yet! It's wonderful to make new friends early and watch other moms interact with their babies. No registration or fee.  Big Brother/Big Sister Let your children share in the joy of a new brother or sister in this special class designed just for them. Discussion includes how families care for babies: swaddling, holding, diapering, safety, as well as how they can be helpful in their new role. This class is designed for children ages 2 to 27, but any age is welcome. Please register each child individually. $5 Breastfeeding  Support Group This group is a mother-to-mother support circle where moms have the opportunity to share their breastfeeding experiences. A Breastfeeding Support nurse is present for questions and concerns. An infant scale is available for weight checks. No fee or registration.  Breastfeeding Your Baby Breastfeeding is a special time for mother and child. This class will help you feel ready to begin this important relationship. Your partner is encouraged to attend with you. Learn what to expect and feel more confident in the first days of breastfeeding  your newborn. This class also addresses the most common fears and challenges of breastfeeding during the first few weeks, months, and beyond. $30 per couple Caring for Baby This class is for expectant and adoptive parents who want to learn and practice the most up-to-date newborn care for their babies. Focus is on birth through first six weeks of life. Topics include feeding, bathing, diapering, crying, umbilical cord care, circumcision care and safe sleep. Parents learn how to recognize symptoms of illness and when to call the pediatrician. Register only the mom-to-be and your partner can plan to come with you. (*Note: This class is included in the Birth & Baby series and the Weekend Birth & Baby classes.) $10 per couple Comfort Techniques & Tour This 2-hour interactive class is designed for those who either do not wish to take the Birth & Baby series or for those who prefer our online childbirth class, but don't want to miss the opportunity to learn and practice hands-on techniques. These skills can help relieve some of the discomfort of labor and encourage your baby to rotate toward the best position for birth. You and your partner will be able to try a variety of labor positions with birth balls and rebozos as well as practice breathing, relaxation, and visual techniques. $20 per couple [employer] This course offers Dads-to-be the tools and knowledge  needed to feel confident on their journey to becoming new fathers. Experienced dads, who have been trained as coaches, teach dads-to-be how to hold, comfort, diapers, swaddle and play with their infant while being able to support the new mom as well. $25 Grandparent Love Expecting a grandbaby? Learn about the latest infant care and safety recommendations and ways to support your own child as he or she transitions into the parenting role. $10 per person Infant and Child CPR Parents, grandparents, babysitters, and friends learn Cardio-Pulmonary Resuscitation skills for infants and children. You will also learn how to treat both conscious and unconscious choking infants and children. Register each participant individually. (Note: This Family & Friends program does not offer certification.) $20 per person Marvelous Multiples Expecting twins, triplets, or more? This free 2-hour class covers the differences in labor, birth, parenting, and breastfeeding issues that face multiples' parents.  Maternity Care Center Virtual Tour  Online virtual tour of the new [place] [facility] at [facility] Talk This free mom-led group offers support and connection to mothers as they journey through the adjustments and struggles of that sometimes overwhelming first year after the birth of a child. A member of our staff will be present to share resources and additional support if needed, as you care for yourself and baby. You are welcome to visit this group before you deliver! It's wonderful to meet new friends early and watch other moms interact with their babies.  Waterbirth Class Interested in a waterbirth? This free informational class will help you discover whether waterbirth is the right fit for you and is required if you are planning a waterbirth. Education about waterbirth itself, supplies you may need, and what you may need from your support team is included in this class. Partners are  encouraged to come.

## 2020-07-04 ENCOUNTER — Encounter (HOSPITAL_COMMUNITY): Payer: Self-pay | Admitting: Obstetrics & Gynecology

## 2020-07-04 ENCOUNTER — Other Ambulatory Visit: Payer: Self-pay

## 2020-07-04 ENCOUNTER — Telehealth: Payer: Self-pay | Admitting: Advanced Practice Midwife

## 2020-07-04 ENCOUNTER — Inpatient Hospital Stay (HOSPITAL_COMMUNITY)
Admission: AD | Admit: 2020-07-04 | Discharge: 2020-07-04 | Disposition: A | Discharge status: Home / Self Care | Payer: Medicaid Other | Attending: Obstetrics & Gynecology | Admitting: Obstetrics & Gynecology

## 2020-07-04 ENCOUNTER — Encounter: Payer: Medicaid Other | Admitting: Medical

## 2020-07-04 DIAGNOSIS — N930 Postcoital and contact bleeding: Secondary | ICD-10-CM | POA: Diagnosis not present

## 2020-07-04 DIAGNOSIS — Z3A24 24 weeks gestation of pregnancy: Secondary | ICD-10-CM | POA: Diagnosis not present

## 2020-07-04 DIAGNOSIS — O99891 Other specified diseases and conditions complicating pregnancy: Secondary | ICD-10-CM | POA: Diagnosis not present

## 2020-07-04 DIAGNOSIS — Z79899 Other long term (current) drug therapy: Secondary | ICD-10-CM | POA: Diagnosis not present

## 2020-07-04 DIAGNOSIS — O4692 Antepartum hemorrhage, unspecified, second trimester: Secondary | ICD-10-CM | POA: Diagnosis not present

## 2020-07-04 LAB — URINALYSIS, ROUTINE W REFLEX MICROSCOPIC
Bilirubin Urine: NEGATIVE
Glucose, UA: NEGATIVE mg/dL
Ketones, ur: NEGATIVE mg/dL
Nitrite: NEGATIVE
Protein, ur: NEGATIVE mg/dL
Specific Gravity, Urine: 1.018 (ref 1.005–1.030)
pH: 8 (ref 5.0–8.0)

## 2020-07-04 NOTE — MAU Note (Signed)
.  Sarah Rollins is a 20 y.o. at [redacted]w[redacted]d here in MAU reporting: vaginal bleeding and passing pea sized clots that began last night. She states that there was digital penetration by partner and that's when she started bleeding. This morning the blood was dark brown. She was sent over from the office for evaluation.

## 2020-07-04 NOTE — MAU Provider Note (Signed)
History     CSN: [id number]  Arrival date and time: 07/04/20 1202   Event Date/Time   First Provider Initiated Contact with Patient 07/04/20 1410      Chief Complaint  Patient presents with  . Vaginal Bleeding   [name] is a 20 y.o. year old G29P0000 female at [redacted]w[redacted]d weeks gestation who was sent to MAU after calling [facility]  with reports of VB with a pea-sized blood clot last night after digital penetration from her partner. She reports the bleeding was lighter and dark brown this AM. She called to report the bleeding to [facility] and was instructed to come to MAU for evaluation. She reports (+) FM.    OB History    Gravida  1   Para  0   Term  0   Preterm  0   AB  0   Living  0     SAB  0   IAB  0   Ectopic  0   Multiple  0   Live Births  0           Past Medical History:  Diagnosis Date  . ADHD     Past Surgical History:  Procedure Laterality Date  . WISDOM TOOTH EXTRACTION      Family History  Problem Relation Age of Onset  . Depression Sister   . Anxiety disorder Sister   . Hypertension Mother   . Heart Problems Mother   . Colon cancer Maternal Grandfather   . Breast cancer Paternal Grandmother     Social History   Tobacco Use  . Smoking status: Never Smoker  . Smokeless tobacco: Never Used  Vaping Use  . Vaping Use: Never used  Substance Use Topics  . Alcohol use: Not Currently  . Drug use: Yes    Types: Marijuana    Allergies:  Allergies  Allergen Reactions  . Ciprofloxacin Rash    Medications Prior to Admission  Medication Sig Dispense Refill Last Dose  . ondansetron (ZOFRAN ODT) 4 MG disintegrating tablet Take 1 tablet (4 mg total) by mouth every 6 (six) hours as needed for nausea. 30 tablet 2 Past Month at Unknown time  . Prenatal Vit-Fe Fumarate-FA (PRENATAL VITAMIN PO) Take by mouth.    Past Month at Unknown time  . Blood Pressure Monitor MISC For regular home bp monitoring during pregnancy 1 each 0   .  clotrimazole (CLOTRIMAZOLE ANTI-FUNGAL) 1 % cream Apply 1 application topically 2 (two) times daily. (Patient not taking: Reported on 07/03/2020) 30 g 1   . Doxylamine-Pyridoxine 10-10 MG TBEC Take 1 tablet by mouth 2 (two) times daily. Take 1 tablet by mouth in the morning, at noon, and at bedtime. Two tablets at bedtime on day 1 and 2; if symptoms persist, take 1 tablet in morning and 2 tablets at bedtime on day 3; if symptoms persist, may increase to 1 tablet in morning, 1 tablet mid-afternoon, and 2 tablets at bedtime on day 4 (maximum: doxylamine 40 mg/pyridoxine 40 mg (4 tablets) per day). Take on empty stomach with full glass of water. 60 tablet 6   . promethazine (PHENERGAN) 25 MG tablet TAKE 1/2 TO 1 TABLET BY MOUTH EVERY 6 HOURS AS NEEDED FOR NAUSEA OR VOMITING. (Patient not taking: Reported on 07/03/2020) 30 tablet 6     Review of Systems  Constitutional: Negative.   HENT: Negative.   Eyes: Negative.   Respiratory: Negative.   Cardiovascular: Negative.   Gastrointestinal: Negative.  Endocrine: Negative.   Genitourinary: Positive for vaginal bleeding.  Musculoskeletal: Negative.   Skin: Negative.   Allergic/Immunologic: Negative.   Neurological: Negative.   Hematological: Negative.   Psychiatric/Behavioral: Negative.    Physical Exam   Blood pressure 121/77, pulse 96, temperature 98 F (36.7 C), temperature source Oral, resp. rate 15, last menstrual period 11/27/2019, SpO2 99 %.  Physical Exam Vitals and nursing note reviewed. Exam conducted with a chaperone present.  Constitutional:      Appearance: Normal appearance. She is normal weight.  HENT:     Head: Normocephalic and atraumatic.  Cardiovascular:     Rate and Rhythm: Normal rate.     Pulses: Normal pulses.  Pulmonary:     Effort: Pulmonary effort is normal.  Abdominal:     Palpations: Abdomen is soft.  Genitourinary:    General: Normal vulva.     Comments: Uterus: gravid, S=D, SE: cervix is smooth, pink, no  lesions, scant amt of brown thick vaginal d/c, no CMT or friability, no adnexal tenderness  Musculoskeletal:     Cervical back: Normal range of motion.  Skin:    General: Skin is warm and dry.  Neurological:     Mental Status: She is alert and oriented to person, place, and time.  Psychiatric:        Mood and Affect: Mood normal.        Behavior: Behavior normal.        Thought Content: Thought content normal.        Judgment: Judgment normal.    REACTIVE NST - FHR: 140 bpm / moderate variability / accels present / decels absent / TOCO: none   MAU Course  Procedures  MDM CCUA   Results for orders placed or performed during the hospital encounter of 07/04/20 (from the past 24 hour(s))  Urinalysis, Routine w reflex microscopic Urine, Clean Catch     Status: Abnormal   Collection Time: 07/04/20 12:43 PM  Result Value Ref Range   Color, Urine YELLOW YELLOW   APPearance CLOUDY (A) CLEAR   Specific Gravity, Urine 1.018 1.005 - 1.030   pH 8.0 5.0 - 8.0   Glucose, UA NEGATIVE NEGATIVE mg/dL   Hgb urine dipstick MODERATE (A) NEGATIVE   Bilirubin Urine NEGATIVE NEGATIVE   Ketones, ur NEGATIVE NEGATIVE mg/dL   Protein, ur NEGATIVE NEGATIVE mg/dL   Nitrite NEGATIVE NEGATIVE   Leukocytes,Ua TRACE (A) NEGATIVE   RBC / HPF 0-5 0 - 5 RBC/hpf   WBC, UA 0-5 0 - 5 WBC/hpf   Bacteria, UA RARE (A) NONE SEEN   Squamous Epithelial / LPF 6-10 0 - 5   Mucus PRESENT    Amorphous Crystal PRESENT     Assessment and Plan  Postcoital bleeding - Plan: Discharge patient - Advised to abstain from SI until her VB stops, warned against inserting anything in her vagina - Information provided on VB in pregnancy, 2nd pregnancy - Keep scheduled appt with [facility] - Patient verbalized an understanding of the plan of care and agrees.     Raelyn Mora, CNM 07/04/2020, 2:19 PM

## 2020-07-04 NOTE — Discharge Instructions (Signed)
Vaginal Bleeding During Pregnancy, Second Trimester A small amount of bleeding from the vagina is common during pregnancy. This kind of bleeding is also called spotting. Sometimes the bleeding is normal and is not a sign of problems. In some other cases, it is a sign of something serious. In the second trimester, normal bleeding can happen:  Because of changes in your blood vessels.  When you have sex.  When you have pelvic exams. In the second trimester, some abnormal things can cause bleeding. These include:  Infection or swelling.  Growths in the lowest part of the womb (cervix). These growths are also called polyps.  Problems of the placenta. The placenta can block the opening of the cervix. The placenta can also break away from the womb.  Miscarriage.  Early labor.  The cervix that opens early, before labor.  An egg that was not fertilized properly. This causes a type of pregnancy called molar pregnancy. Tell your doctor right away if there is any bleeding from your vagina. Follow these instructions at home: Watch your bleeding  Watch your condition for any changes. Let your doctor know if you are worried about something.  Try to know what causes your bleeding. Ask yourself these questions: ? Does the bleeding start on its own? ? Does the bleeding start after something is done, such as sex or a pelvic exam?  Use a diary to write down the things you see about your bleeding. Write in your diary: ? If the bleeding flows freely without stopping, or if it starts and stops, and then starts again. ? If the bleeding is heavy or light. ? How many pads you use in a day and how much blood is in them.  Tell your doctor if you pass tissue. He or she may want to see it.   Activity  Follow your doctor's instructions about limiting your activities. Ask what activities are safe for you.  Do not exercise or do activities that take a lot of effort until your doctor says that this is  safe.  Do not have sex until your doctor says that this is safe.  Do not lift anything that is heavier than 10 lb (4.5 kg), or the limit that you are told.  If needed, make plans for someone to help with your normal activities. Medicines  Take over-the-counter and prescription medicines only as told by your doctor.  Do not take aspirin. It can cause bleeding. General instructions  Do not use tampons.  Do not douche.  Keep all follow-up visits. Contact a doctor if:  You have bleeding in the vagina at any time during pregnancy.  You have cramps.  You have a fever that does not get better with medicine. Get help right away if:  You have very bad cramps in your back or belly (abdomen).  You have contractions.  You have chills.  Your bleeding gets worse.  You pass large clots or a lot of tissue from your vagina.  You feel light-headed.  You feel weak.  You faint.  You are leaking fluid from your vagina.  You have a gush of fluid from your vagina. Summary  A small amount of bleeding during pregnancy is normal. But bleeding can be a sign of something serious. Tell your doctor right away about any bleeding from your vagina.  Try to know what causes your bleeding. Does the bleeding occur on its own, or does it occur after something is done, such as sex or pelvic exams?  Follow your doctor's instructions about what activities you can do.  Keep all follow-up visits. This information is not intended to replace advice given to you by your health care provider. Make sure you discuss any questions you have with your health care provider. Document Revised: 02/14/2020 Document Reviewed: 02/14/2020 Elsevier Patient Education  2021 Elsevier Inc.  

## 2020-07-04 NOTE — Telephone Encounter (Signed)
Patient is experiencing some bleeding and clots. Clinical staff informed the patient to come into the office to be seen by a provider. Okay to schedule per [facility]

## 2020-07-08 ENCOUNTER — Telehealth (INDEPENDENT_AMBULATORY_CARE_PROVIDER_SITE_OTHER): Payer: Medicaid Other

## 2020-07-08 ENCOUNTER — Other Ambulatory Visit: Payer: Self-pay

## 2020-07-08 VITALS — BP 122/78 | HR 84 | Wt 166.6 lb

## 2020-07-08 DIAGNOSIS — Z3A24 24 weeks gestation of pregnancy: Secondary | ICD-10-CM

## 2020-07-08 DIAGNOSIS — Z0131 Encounter for examination of blood pressure with abnormal findings: Secondary | ICD-10-CM

## 2020-07-08 NOTE — Progress Notes (Addendum)
   NURSE VISIT- BLOOD PRESSURE CHECK  SUBJECTIVE:  Sarah Rollins is a 20 y.o. G86P0000 female here for BP check. She is [redacted]w[redacted]d pregnant    HYPERTENSION ROS:  Pregnant/postpartum:  . Severe headaches that don't go away with tylenol/other medicines: No  . Visual changes (seeing spots/double/blurred vision) No  . Severe pain under right breast breast or in center of upper chest No  . Severe nausea/vomiting No  . Taking medicines as instructed yes  OBJECTIVE:  BP 122/78   Pulse 84   Wt 166 lb 9.6 oz (75.6 kg)   LMP 11/27/2019 (Exact Date)   BMI 33.65 kg/m   Appearance oriented to person, place, and time.  ASSESSMENT: Pregnancy [redacted]w[redacted]d  blood pressure check  PLAN: Discussed with Joellyn Haff, CNM, [facility]   Recommendations: no changes needed   Follow-up: as scheduled   Rayyan Orsborn A Fallynn Gravett  07/08/2020 3:30 PM   Chart reviewed for nurse visit. Agree with plan of care.  Cheral Marker, [place] 07/08/2020 4:46 PM

## 2020-07-25 ENCOUNTER — Ambulatory Visit (INDEPENDENT_AMBULATORY_CARE_PROVIDER_SITE_OTHER): Payer: Medicaid Other | Admitting: Women's Health

## 2020-07-25 ENCOUNTER — Other Ambulatory Visit: Payer: Self-pay

## 2020-07-25 ENCOUNTER — Other Ambulatory Visit: Payer: Medicaid Other

## 2020-07-25 ENCOUNTER — Encounter: Payer: Self-pay | Admitting: Women's Health

## 2020-07-25 VITALS — BP 129/77 | HR 81 | Wt 168.4 lb

## 2020-07-25 DIAGNOSIS — Z3402 Encounter for supervision of normal first pregnancy, second trimester: Secondary | ICD-10-CM

## 2020-07-25 DIAGNOSIS — Z131 Encounter for screening for diabetes mellitus: Secondary | ICD-10-CM

## 2020-07-25 DIAGNOSIS — Z3A27 27 weeks gestation of pregnancy: Secondary | ICD-10-CM

## 2020-07-25 NOTE — Patient Instructions (Addendum)
Sarah Rollins, I greatly value your feedback.  If you receive a survey following your visit with [place] today, we appreciate you taking the time to fill it out.  Thanks, Joellyn Haff, CNM, WHNP-BC   [facility] ([address] [place], [place] [address]) Entrance C, located off of [address] valet parking  Go to [email or web address] to register for FREE online childbirth classes   Call the office [phone]) or go to [facility] if:  You begin to have strong, frequent contractions  Your water breaks.  Sometimes it is a big gush of fluid, sometimes it is just a trickle that keeps getting your panties wet or running down your legs  You have vaginal bleeding.  It is normal to have a small amount of spotting if your cervix was checked.   You don't feel your baby moving like normal.  If you don't, get you something to eat and drink and lay down and focus on feeling your baby move.  You should feel at least 10 movements in 2 hours.  If you don't, you should call the office or go to [facility].    Tdap Vaccine  It is recommended that you get the Tdap vaccine during the third trimester of EACH pregnancy to help protect your baby from getting pertussis (whooping cough)  27-36 weeks is the BEST time to do this so that you can pass the protection on to your baby. During pregnancy is better than after pregnancy, but if you are unable to get it during pregnancy it will be offered at the hospital.   You can get this vaccine with [place], at the health department, your family doctor, or some local pharmacies  Everyone who will be around your baby should also be up-to-date on their vaccines before the baby comes. Adults (who are not pregnant) only need 1 dose of Tdap during adulthood.   [place] Pediatricians/Family Doctors:  [facility] [phone]            [facility] [phone]                 [facility]  [phone] (usually not accepting new patients unless you have family there already, you are always welcome to call and ask)       [facility] Department [phone]       [facility] Pediatricians/Family Doctors:   [facility]: [phone]  [facility]: [phone]  [facility]: [phone]  [facility] Doctors:   [facility]: [phone]   [facility]: [phone]  [facility] Doctors:  [facility]: [phone]    For Headaches:   Stay well hydrated, drink enough water so that your urine is clear, sometimes if you are dehydrated you can get headaches  Eat small frequent meals and snacks, sometimes if you are hungry you can get headaches  Sometimes you get headaches during pregnancy from the pregnancy hormones  You can try tylenol (1-2 regular strength 325mg  or 1-2 extra strength 500mg ) as directed on the box. The least amount of medication that works is best.   Cool compresses (cool wet washcloth or ice pack) to area of head that is hurting  You can also try drinking a caffeinated drink to see if this will help  If not helping, try below:  For Prevention of Headaches/Migraines:  CoQ10 100mg  three times daily  Vitamin B2 400mg  daily  Magnesium Oxide 400-600mg  daily  If You Get a Bad Headache/Migraine:  Benadryl 25mg    Magnesium Oxide  1 large Gatorade  2 extra strength Tylenol (1,000mg  total)  1 cup coffee or Coke  If this doesn't help please call @ [phone]    Home Blood Pressure Monitoring for Patients   Your provider has recommended that you check your blood pressure (BP) at least once a week at home. If you do not have a blood pressure cuff at home, one will be provided for you. Contact your provider if you have not received your monitor within 1 week.   Helpful Tips for Accurate Home Blood Pressure Checks  . Don't smoke,  exercise, or drink caffeine 30 minutes before checking your BP . Use the restroom before checking your BP (a full bladder can raise your pressure) . Relax in a comfortable upright chair . Feet on the ground . Left arm resting comfortably on a flat surface at the level of your heart . Legs uncrossed . Back supported . Sit quietly and don't talk . Place the cuff on your bare arm . Adjust snuggly, so that only two fingertips can fit between your skin and the top of the cuff . Check 2 readings separated by at least one minute . Keep a log of your BP readings . For a visual, please reference this diagram: http://ccnc.care/bpdiagram  Provider Name: [facility]     Phone: [phone]  Zone 1: ALL CLEAR  Continue to monitor your symptoms:  . BP reading is less than 140 (top number) or less than 90 (bottom number)  . No right upper stomach pain . No headaches or seeing spots . No feeling nauseated or throwing up . No swelling in face and hands  Zone 2: CAUTION Call your doctor's office for any of the following:  . BP reading is greater than 140 (top number) or greater than 90 (bottom number)  . Stomach pain under your ribs in the middle or right side . Headaches or seeing spots . Feeling nauseated or throwing up . Swelling in face and hands  Zone 3: EMERGENCY  Seek immediate medical care if you have any of the following:  . BP reading is greater than160 (top number) or greater than 110 (bottom number) . Severe headaches not improving with Tylenol . Serious difficulty catching your breath . Any worsening symptoms from Zone 2   Third Trimester of Pregnancy The third trimester is from week 29 through week 42, months 7 through 9. The third trimester is a time when the fetus is growing rapidly. At the end of the ninth month, the fetus is about 20 inches in length and weighs 6-10 pounds.  BODY CHANGES Your body goes through many changes during pregnancy. The changes vary from woman  to woman.   Your weight will continue to increase. You can expect to gain 25-35 pounds (11-16 kg) by the end of the pregnancy.  You may begin to get stretch marks on your hips, abdomen, and breasts.  You may urinate more often because the fetus is moving lower into your pelvis and pressing on your bladder.  You may develop or continue to have heartburn as a result of your pregnancy.  You may develop constipation because certain hormones are causing the muscles that push waste through your intestines to slow down.  You may develop hemorrhoids or swollen, bulging veins (varicose veins).  You may have pelvic pain because of the weight gain and pregnancy hormones relaxing your joints between  the bones in your pelvis. Backaches may result from overexertion of the muscles supporting your posture.  You may have changes in your hair. These can include thickening of your hair, rapid growth, and changes in texture. Some women also have hair loss during or after pregnancy, or hair that feels dry or thin. Your hair will most likely return to normal after your baby is born.  Your breasts will continue to grow and be tender. A yellow discharge may leak from your breasts called colostrum.  Your belly button may stick out.  You may feel short of breath because of your expanding uterus.  You may notice the fetus "dropping," or moving lower in your abdomen.  You may have a bloody mucus discharge. This usually occurs a few days to a week before labor begins.  Your cervix becomes thin and soft (effaced) near your due date. WHAT TO EXPECT AT YOUR PRENATAL EXAMS  You will have prenatal exams every 2 weeks until week 36. Then, you will have weekly prenatal exams. During a routine prenatal visit:  You will be weighed to make sure you and the fetus are growing normally.  Your blood pressure is taken.  Your abdomen will be measured to track your baby's growth.  The fetal heartbeat will be listened  to.  Any test results from the previous visit will be discussed.  You may have a cervical check near your due date to see if you have effaced. At around 36 weeks, your caregiver will check your cervix. At the same time, your caregiver will also perform a test on the secretions of the vaginal tissue. This test is to determine if a type of bacteria, Group B streptococcus, is present. Your caregiver will explain this further. Your caregiver may ask you:  What your birth plan is.  How you are feeling.  If you are feeling the baby move.  If you have had any abnormal symptoms, such as leaking fluid, bleeding, severe headaches, or abdominal cramping.  If you have any questions. Other tests or screenings that may be performed during your third trimester include:  Blood tests that check for low iron levels (anemia).  Fetal testing to check the health, activity level, and growth of the fetus. Testing is done if you have certain medical conditions or if there are problems during the pregnancy. FALSE LABOR You may feel small, irregular contractions that eventually go away. These are called Braxton Hicks contractions, or false labor. Contractions may last for hours, days, or even weeks before true labor sets in. If contractions come at regular intervals, intensify, or become painful, it is best to be seen by your caregiver.  SIGNS OF LABOR   Menstrual-like cramps.  Contractions that are 5 minutes apart or less.  Contractions that start on the top of the uterus and spread down to the lower abdomen and back.  A sense of increased pelvic pressure or back pain.  A watery or bloody mucus discharge that comes from the vagina. If you have any of these signs before the 37th week of pregnancy, call your caregiver right away. You need to go to the hospital to get checked immediately. HOME CARE INSTRUCTIONS   Avoid all smoking, herbs, alcohol, and unprescribed drugs. These chemicals affect the  formation and growth of the baby.  Follow your caregiver's instructions regarding medicine use. There are medicines that are either safe or unsafe to take during pregnancy.  Exercise only as directed by your caregiver. Experiencing uterine cramps is  a good sign to stop exercising.  Continue to eat regular, healthy meals.  Wear a good support bra for breast tenderness.  Do not use hot tubs, steam rooms, or saunas.  Wear your seat belt at all times when driving.  Avoid raw meat, uncooked cheese, cat litter boxes, and soil used by cats. These carry germs that can cause birth defects in the baby.  Take your prenatal vitamins.  Try taking a stool softener (if your caregiver approves) if you develop constipation. Eat more high-fiber foods, such as fresh vegetables or fruit and whole grains. Drink plenty of fluids to keep your urine clear or pale yellow.  Take warm sitz baths to soothe any pain or discomfort caused by hemorrhoids. Use hemorrhoid cream if your caregiver approves.  If you develop varicose veins, wear support hose. Elevate your feet for 15 minutes, 3-4 times a day. Limit salt in your diet.  Avoid heavy lifting, wear low heal shoes, and practice good posture.  Rest a lot with your legs elevated if you have leg cramps or low back pain.  Visit your dentist if you have not gone during your pregnancy. Use a soft toothbrush to brush your teeth and be gentle when you floss.  A sexual relationship may be continued unless your caregiver directs you otherwise.  Do not travel far distances unless it is absolutely necessary and only with the approval of your caregiver.  Take prenatal classes to understand, practice, and ask questions about the labor and delivery.  Make a trial run to the hospital.  Pack your hospital bag.  Prepare the baby's nursery.  Continue to go to all your prenatal visits as directed by your caregiver. SEEK MEDICAL CARE IF:  You are unsure if you are in  labor or if your water has broken.  You have dizziness.  You have mild pelvic cramps, pelvic pressure, or nagging pain in your abdominal area.  You have persistent nausea, vomiting, or diarrhea.  You have a bad smelling vaginal discharge.  You have pain with urination. SEEK IMMEDIATE MEDICAL CARE IF:   You have a fever.  You are leaking fluid from your vagina.  You have spotting or bleeding from your vagina.  You have severe abdominal cramping or pain.  You have rapid weight loss or gain.  You have shortness of breath with chest pain.  You notice sudden or extreme swelling of your face, hands, ankles, feet, or legs.  You have not felt your baby move in over an hour.  You have severe headaches that do not go away with medicine.  You have vision changes. Document Released: 05/18/2001 Document Revised: 05/29/2013 Document Reviewed: 07/25/2012 [facility] Patient Information 2015 [place], [place]. This information is not intended to replace advice given to you by your health care provider. Make sure you discuss any questions you have with your health care provider.

## 2020-07-25 NOTE — Progress Notes (Signed)
   LOW-RISK PREGNANCY VISIT Patient name: Sarah Rollins MRN [id number]  Date of birth: 2000-12-02 Chief Complaint:   Routine Prenatal Visit  History of Present Illness:   Sarah Rollins is a 20 y.o. G18P0000 female at [redacted]w[redacted]d with an Estimated Date of Delivery: 10/22/20 being seen today for ongoing management of a low-risk pregnancy.  Depression screen [facility] 2/9 07/25/2020 04/16/2020  Decreased Interest 1 0  Down, Depressed, Hopeless 1 0  PHQ - 2 Score 2 0  Altered sleeping 2 1  Tired, decreased energy 2 1  Change in appetite 1 3  Feeling bad or failure about yourself  1 0  Trouble concentrating 1 0  Moving slowly or fidgety/restless 0 0  Suicidal thoughts 0 0  PHQ-9 Score 9 5    Today she reports occ headaches. Contractions: Irritability. Vag. Bleeding: None.  Movement: Present. denies leaking of fluid. Review of Systems:   Pertinent items are noted in HPI Denies abnormal vaginal discharge w/ itching/odor/irritation, headaches, visual changes, shortness of breath, chest pain, abdominal pain, severe nausea/vomiting, or problems with urination or bowel movements unless otherwise stated above. Pertinent History Reviewed:  Reviewed past medical,surgical, social, obstetrical and family history.  Reviewed problem list, medications and allergies. Physical Assessment:   Vitals:   07/25/20 0933  BP: 129/77  Pulse: 81  Weight: 168 lb 6.4 oz (76.4 kg)  Body mass index is 34.01 kg/m.        Physical Examination:   General appearance: Well appearing, and in no distress  Mental status: Alert, oriented to person, place, and time  Skin: Warm & dry  Cardiovascular: Normal heart rate noted  Respiratory: Normal respiratory effort, no distress  Abdomen: Soft, gravid, nontender  Pelvic: Cervical exam deferred         Extremities: Edema: Trace  Fetal Status: Fetal Heart Rate (bpm): 138 Fundal Height: 26 cm Movement: Present    Chaperone: N/A   No results found for this or any previous visit (from  the past 24 hour(s)).  Assessment & Plan:  1) Low-risk pregnancy G1P0000 at [redacted]w[redacted]d with an Estimated Date of Delivery: 10/22/20   2) Occ headaches, gave printed info    Meds: No orders of the defined types were placed in this encounter.  Labs/procedures today: PN2, declined tdap and flu today, maybe next visit  Plan:  Continue routine obstetrical care  Next visit: prefers online    Reviewed: Preterm labor symptoms and general obstetric precautions including but not limited to vaginal bleeding, contractions, leaking of fluid and fetal movement were reviewed in detail with the patient.  All questions were answered. Has home bp cuff. Check bp weekly, let us know if >140/90.   Follow-up: Return in about 4 weeks (around 08/22/2020) for LROB, CNM, MyChart Video.  No future appointments.  No orders of the defined types were placed in this encounter.  Cheral Marker CNM, [facility] 07/25/2020 9:56 AM

## 2020-07-26 LAB — ANTIBODY SCREEN: Antibody Screen: NEGATIVE

## 2020-07-26 LAB — GLUCOSE TOLERANCE, 2 HOURS W/ 1HR
Glucose, 1 hour: 120 mg/dL (ref 65–179)
Glucose, 2 hour: 91 mg/dL (ref 65–152)
Glucose, Fasting: 78 mg/dL (ref 65–91)

## 2020-07-26 LAB — CBC
Hematocrit: 37.2 % (ref 34.0–46.6)
Hemoglobin: 13 g/dL (ref 11.1–15.9)
MCH: 33.2 pg — ABNORMAL HIGH (ref 26.6–33.0)
MCHC: 34.9 g/dL (ref 31.5–35.7)
MCV: 95 fL (ref 79–97)
Platelets: 261 10*3/uL (ref 150–450)
RBC: 3.91 x10E6/uL (ref 3.77–5.28)
RDW: 11.4 % — ABNORMAL LOW (ref 11.7–15.4)
WBC: 11.7 10*3/uL — ABNORMAL HIGH (ref 3.4–10.8)

## 2020-07-26 LAB — HIV ANTIBODY (ROUTINE TESTING W REFLEX): HIV Screen 4th Generation wRfx: NONREACTIVE

## 2020-07-26 LAB — RPR: RPR Ser Ql: NONREACTIVE

## 2020-08-22 ENCOUNTER — Ambulatory Visit: Payer: Medicaid Other | Admitting: Obstetrics & Gynecology

## 2020-08-22 ENCOUNTER — Other Ambulatory Visit: Payer: Self-pay

## 2020-08-22 VITALS — BP 113/80 | HR 101 | Wt 168.0 lb

## 2020-08-22 DIAGNOSIS — Z3403 Encounter for supervision of normal first pregnancy, third trimester: Secondary | ICD-10-CM

## 2020-08-22 DIAGNOSIS — Z23 Encounter for immunization: Secondary | ICD-10-CM

## 2020-08-22 DIAGNOSIS — Z202 Contact with and (suspected) exposure to infections with a predominantly sexual mode of transmission: Secondary | ICD-10-CM

## 2020-08-22 NOTE — Progress Notes (Signed)
° °  LOW-RISK PREGNANCY VISIT Patient name: Sarah Rollins MRN [id number]  Date of birth: 2001-05-11 Chief Complaint:   Routine Prenatal Visit  History of Present Illness:   Sarah Rollins is a 20 y.o. G78P0000 female at [redacted]w[redacted]d with an Estimated Date of Delivery: 10/22/20 being seen today for ongoing management of a low-risk pregnancy.  Depression screen [facility] 2/9 07/25/2020 04/16/2020  Decreased Interest 1 0  Down, Depressed, Hopeless 1 0  PHQ - 2 Score 2 0  Altered sleeping 2 1  Tired, decreased energy 2 1  Change in appetite 1 3  Feeling bad or failure about yourself  1 0  Trouble concentrating 1 0  Moving slowly or fidgety/restless 0 0  Suicidal thoughts 0 0  PHQ-9 Score 9 5    Today she reports no complaints. Contractions: Irritability. Vag. Bleeding: None.  Movement: Present. denies leaking of fluid.  Desires STI screening- pt using condoms regularly since her boyfriend has not completed POC, but they had a slip up and she wants to make sure.  Review of Systems:   Pertinent items are noted in HPI Denies abnormal vaginal discharge w/ itching/odor/irritation, headaches, visual changes, shortness of breath, chest pain, abdominal pain, severe nausea/vomiting, or problems with urination or bowel movements unless otherwise stated above. Pertinent History Reviewed:  Reviewed past medical,surgical, social, obstetrical and family history.  Reviewed problem list, medications and allergies.  Physical Assessment:   Vitals:   08/22/20 1107  BP: 113/80  Pulse: (!) 101  Weight: 168 lb (76.2 kg)  Body mass index is 33.93 kg/m.        Physical Examination:   General appearance: Well appearing, and in no distress  Mental status: Alert, oriented to person, place, and time  Skin: Warm & dry  Respiratory: Normal respiratory effort, no distress  Abdomen: Soft, gravid, nontender  Pelvic: Cervical exam deferred         Extremities: Edema: Mild pitting, slight indentation  Psych:  mood and affect  appropriate  Fetal Status: Fetal Heart Rate (bpm): 155 Fundal Height: 30 cm Movement: Present    Chaperone: n/a    No results found for this or any previous visit (from the past 24 hour(s)).   Assessment & Plan:  1) Low-risk pregnancy G1P0000 at [redacted]w[redacted]d with an Estimated Date of Delivery: 10/22/20     Meds: No orders of the defined types were placed in this encounter.  Labs/procedures today: GC/C today, Tdap and flu shot given  Plan:  Continue routine obstetrical care Next visit: prefers in person    Reviewed: Preterm labor symptoms and general obstetric precautions including but not limited to vaginal bleeding, contractions, leaking of fluid and fetal movement were reviewed in detail with the patient.  All questions were answered. Pt has  home bp cuff. Check bp weekly, let us know if >140/90.   Follow-up: Return in about 2 weeks (around 09/05/2020) for LROB visit.  Orders Placed This Encounter  Procedures   GC/Chlamydia Probe Amp   Tdap vaccine greater than or equal to 7yo IM   Flu Vaccine QUAD 82mo+IM (Fluarix, Fluzone & Alfiuria Quad PF)    Myna Hidalgo, DO Attending Obstetrician & Gynecologist, [facility] for [employer], [facility]

## 2020-08-24 LAB — GC/CHLAMYDIA PROBE AMP
Chlamydia trachomatis, NAA: NEGATIVE
Neisseria Gonorrhoeae by PCR: NEGATIVE

## 2020-09-05 ENCOUNTER — Encounter: Payer: Self-pay | Admitting: Women's Health

## 2020-09-05 ENCOUNTER — Ambulatory Visit (INDEPENDENT_AMBULATORY_CARE_PROVIDER_SITE_OTHER): Payer: Medicaid Other | Admitting: Women's Health

## 2020-09-05 ENCOUNTER — Other Ambulatory Visit: Payer: Self-pay

## 2020-09-05 VITALS — BP 128/81 | HR 109 | Wt 174.0 lb

## 2020-09-05 DIAGNOSIS — Z3403 Encounter for supervision of normal first pregnancy, third trimester: Secondary | ICD-10-CM

## 2020-09-05 DIAGNOSIS — Z3A33 33 weeks gestation of pregnancy: Secondary | ICD-10-CM

## 2020-09-05 MED ORDER — PANTOPRAZOLE SODIUM 20 MG PO TBEC: 20.0000 mg | DELAYED_RELEASE_TABLET | Freq: Every day | ORAL | 3 refills | Status: DC

## 2020-09-05 NOTE — Progress Notes (Signed)
   LOW-RISK PREGNANCY VISIT Patient name: Sarah Rollins MRN [id number]  Date of birth: December 25, 2000 Chief Complaint:   Routine Prenatal Visit  History of Present Illness:   Sarah Rollins is a 20 y.o. G31P0000 female at [redacted]w[redacted]d with an Estimated Date of Delivery: 10/22/20 being seen today for ongoing management of a low-risk pregnancy.  Depression screen [facility] 2/9 07/25/2020 04/16/2020  Decreased Interest 1 0  Down, Depressed, Hopeless 1 0  PHQ - 2 Score 2 0  Altered sleeping 2 1  Tired, decreased energy 2 1  Change in appetite 1 3  Feeling bad or failure about yourself  1 0  Trouble concentrating 1 0  Moving slowly or fidgety/restless 0 0  Suicidal thoughts 0 0  PHQ-9 Score 9 5    Today she reports reflux. Contractions: Irritability. Vag. Bleeding: None.  Movement: Present. denies leaking of fluid. Review of Systems:   Pertinent items are noted in HPI Denies abnormal vaginal discharge w/ itching/odor/irritation, headaches, visual changes, shortness of breath, chest pain, abdominal pain, severe nausea/vomiting, or problems with urination or bowel movements unless otherwise stated above. Pertinent History Reviewed:  Reviewed past medical,surgical, social, obstetrical and family history.  Reviewed problem list, medications and allergies. Physical Assessment:   Vitals:   09/05/20 1305  BP: 128/81  Pulse: (!) 109  Weight: 174 lb (78.9 kg)  Body mass index is 35.14 kg/m.        Physical Examination:   General appearance: Well appearing, and in no distress  Mental status: Alert, oriented to person, place, and time  Skin: Warm & dry  Cardiovascular: Normal heart rate noted  Respiratory: Normal respiratory effort, no distress  Abdomen: Soft, gravid, nontender  Pelvic: Cervical exam deferred         Extremities: Edema: Trace  Fetal Status: Fetal Heart Rate (bpm): 143 Fundal Height: 34 cm Movement: Present    Chaperone: N/A   No results found for this or any previous visit (from the past 24  hour(s)).  Assessment & Plan:  1) Low-risk pregnancy G1P0000 at [redacted]w[redacted]d with an Estimated Date of Delivery: 10/22/20   2) Reflux, rx protonix   Meds:  Meds ordered this encounter  Medications  . pantoprazole (PROTONIX) 20 MG tablet    Sig: Take 1 tablet (20 mg total) by mouth daily.    Dispense:  30 tablet    Refill:  3    Order Specific Question:   Supervising Provider    Answer:   Lazaro Arms [[id number]]   Labs/procedures today: none  Plan:  Continue routine obstetrical care  Next visit: prefers online    Reviewed: Preterm labor symptoms and general obstetric precautions including but not limited to vaginal bleeding, contractions, leaking of fluid and fetal movement were reviewed in detail with the patient.  All questions were answered. Does have home bp cuff. Office bp cuff given: not applicable. Check bp weekly, let us know if consistently >140 and/or >90.  Follow-up: Return in about 2 weeks (around 09/19/2020) for LROB, CNM, MyChart Video.  Future Appointments  Date Time Provider Department Center  09/18/2020 11:50 AM Cheral Marker, CNM [facility]    No orders of the defined types were placed in this encounter.  Cheral Marker CNM, [facility] 09/05/2020 1:42 PM

## 2020-09-05 NOTE — Patient Instructions (Signed)
Sarah Rollins, I greatly value your feedback.  If you receive a survey following your visit with us today, we appreciate you taking the time to fill it out.  Thanks, Kim Bernette Seeman, CNM, WHNP-BC  [facility] ([address] [place], [place] [address]) Entrance C, located off of [address] Free 24/7 valet parking   Go to [email or web address] to register for FREE online childbirth classes    Call the office ([phone]) or go to [facility] if:  You begin to have strong, frequent contractions  Your water breaks.  Sometimes it is a big gush of fluid, sometimes it is just a trickle that keeps getting your panties wet or running down your legs  You have vaginal bleeding.  It is normal to have a small amount of spotting if your cervix was checked.   You don't feel your baby moving like normal.  If you don't, get you something to eat and drink and lay down and focus on feeling your baby move.  You should feel at least 10 movements in 2 hours.  If you don't, you should call the office or go to [facility].   Call the office ([phone]) or go to [facility] for these signs of pre-eclampsia:  Severe headache that does not go away with Tylenol  Visual changes- seeing spots, double, blurred vision  Pain under your right breast or upper abdomen that does not go away with Tums or heartburn medicine  Nausea and/or vomiting  Severe swelling in your hands, feet, and face    Home Blood Pressure Monitoring for Patients   Your provider has recommended that you check your blood pressure (BP) at least once a week at home. If you do not have a blood pressure cuff at home, one will be provided for you. Contact your provider if you have not received your monitor within 1 week.   Helpful Tips for Accurate Home Blood Pressure Checks  . Don't smoke, exercise, or drink caffeine 30 minutes before checking your BP . Use the restroom before checking your BP (a full bladder  can raise your pressure) . Relax in a comfortable upright chair . Feet on the ground . Left arm resting comfortably on a flat surface at the level of your heart . Legs uncrossed . Back supported . Sit quietly and don't talk . Place the cuff on your bare arm . Adjust snuggly, so that only two fingertips can fit between your skin and the top of the cuff . Check 2 readings separated by at least one minute . Keep a log of your BP readings . For a visual, please reference this diagram: http://ccnc.care/bpdiagram  Provider Name: [facility]     Phone: [phone]  Zone 1: ALL CLEAR  Continue to monitor your symptoms:  . BP reading is less than 140 (top number) or less than 90 (bottom number)  . No right upper stomach pain . No headaches or seeing spots . No feeling nauseated or throwing up . No swelling in face and hands  Zone 2: CAUTION Call your doctor's office for any of the following:  . BP reading is greater than 140 (top number) or greater than 90 (bottom number)  . Stomach pain under your ribs in the middle or right side . Headaches or seeing spots . Feeling nauseated or throwing up . Swelling in face and hands  Zone 3: EMERGENCY  Seek immediate medical care if you have any of the   following:  . BP reading is greater than160 (top number) or greater than 110 (bottom number) . Severe headaches not improving with Tylenol . Serious difficulty catching your breath . Any worsening symptoms from Zone 2  Preterm Labor and Birth Information  The normal length of a pregnancy is 39-41 weeks. Preterm labor is when labor starts before 37 completed weeks of pregnancy. What are the risk factors for preterm labor? Preterm labor is more likely to occur in women who:  Have certain infections during pregnancy such as a bladder infection, sexually transmitted infection, or infection inside the uterus (chorioamnionitis).  Have a shorter-than-normal cervix.  Have gone into preterm  labor before.  Have had surgery on their cervix.  Are younger than age 69 or older than age 11.  Are African American.  Are pregnant with twins or multiple babies (multiple gestation).  Take street drugs or smoke while pregnant.  Do not gain enough weight while pregnant.  Became pregnant shortly after having been pregnant. What are the symptoms of preterm labor? Symptoms of preterm labor include:  Cramps similar to those that can happen during a menstrual period. The cramps may happen with diarrhea.  Pain in the abdomen or lower back.  Regular uterine contractions that may feel like tightening of the abdomen.  A feeling of increased pressure in the pelvis.  Increased watery or bloody mucus discharge from the vagina.  Water breaking (ruptured amniotic sac). Why is it important to recognize signs of preterm labor? It is important to recognize signs of preterm labor because babies who are born prematurely may not be fully developed. This can put them at an increased risk for:  Long-term (chronic) heart and lung problems.  Difficulty immediately after birth with regulating body systems, including blood sugar, body temperature, heart rate, and breathing rate.  Bleeding in the brain.  Cerebral palsy.  Learning difficulties.  Death. These risks are highest for babies who are born before 41 weeks of pregnancy. How is preterm labor treated? Treatment depends on the length of your pregnancy, your condition, and the health of your baby. It may involve: 1. Having a stitch (suture) placed in your cervix to prevent your cervix from opening too early (cerclage). 2. Taking or being given medicines, such as: ? Hormone medicines. These may be given early in pregnancy to help support the pregnancy. ? Medicine to stop contractions. ? Medicines to help mature the baby's lungs. These may be prescribed if the risk of delivery is high. ? Medicines to prevent your baby from developing  cerebral palsy. If the labor happens before 34 weeks of pregnancy, you may need to stay in the hospital. What should I do if I think I am in preterm labor? If you think that you are going into preterm labor, call your health care provider right away. How can I prevent preterm labor in future pregnancies? To increase your chance of having a full-term pregnancy:  Do not use any tobacco products, such as cigarettes, chewing tobacco, and e-cigarettes. If you need help quitting, ask your health care provider.  Do not use street drugs or medicines that have not been prescribed to you during your pregnancy.  Talk with your health care provider before taking any herbal supplements, even if you have been taking them regularly.  Make sure you gain a healthy amount of weight during your pregnancy.  Watch for infection. If you think that you might have an infection, get it checked right away.  Make sure to tell  your health care provider if you have gone into preterm labor before. This information is not intended to replace advice given to you by your health care provider. Make sure you discuss any questions you have with your health care provider. Document Revised: 09/15/2018 Document Reviewed: 10/15/2015 Elsevier Patient Education  [place].

## 2020-09-16 ENCOUNTER — Encounter: Payer: Self-pay | Admitting: *Deleted

## 2020-09-18 ENCOUNTER — Telehealth (INDEPENDENT_AMBULATORY_CARE_PROVIDER_SITE_OTHER): Payer: Medicaid Other | Admitting: Women's Health

## 2020-09-18 ENCOUNTER — Ambulatory Visit (INDEPENDENT_AMBULATORY_CARE_PROVIDER_SITE_OTHER): Payer: Medicaid Other

## 2020-09-18 ENCOUNTER — Other Ambulatory Visit: Payer: Self-pay

## 2020-09-18 ENCOUNTER — Encounter: Payer: Self-pay | Admitting: Women's Health

## 2020-09-18 VITALS — BP 131/92 | HR 80

## 2020-09-18 VITALS — BP 127/84 | HR 101 | Wt 179.4 lb

## 2020-09-18 DIAGNOSIS — O99891 Other specified diseases and conditions complicating pregnancy: Secondary | ICD-10-CM

## 2020-09-18 DIAGNOSIS — Z3403 Encounter for supervision of normal first pregnancy, third trimester: Secondary | ICD-10-CM

## 2020-09-18 DIAGNOSIS — Z1389 Encounter for screening for other disorder: Secondary | ICD-10-CM

## 2020-09-18 LAB — POCT URINALYSIS DIPSTICK OB
Blood, UA: NEGATIVE
Glucose, UA: NEGATIVE
Ketones, UA: NEGATIVE
Leukocytes, UA: NEGATIVE
Nitrite, UA: NEGATIVE
POC,PROTEIN,UA: NEGATIVE

## 2020-09-18 NOTE — Patient Instructions (Signed)
Sarah Rollins, I greatly value your feedback.  If you receive a survey following your visit with [place] today, we appreciate you taking the time to fill it out.  Thanks, Joellyn Haff, CNM, WHNP-BC  [facility] ([address] [place], [place] [address]) Entrance C, located off of [address] valet parking   Go to [email or web address] to register for FREE online childbirth classes    Call the office [phone]) or go to [facility] if:  You begin to have strong, frequent contractions  Your water breaks.  Sometimes it is a big gush of fluid, sometimes it is just a trickle that keeps getting your panties wet or running down your legs  You have vaginal bleeding.  It is normal to have a small amount of spotting if your cervix was checked.   You don't feel your baby moving like normal.  If you don't, get you something to eat and drink and lay down and focus on feeling your baby move.  You should feel at least 10 movements in 2 hours.  If you don't, you should call the office or go to [facility].   Call the office [phone]) or go to [facility] for these signs of pre-eclampsia:  Severe headache that does not go away with Tylenol  Visual changes- seeing spots, double, blurred vision  Pain under your right breast or upper abdomen that does not go away with Tums or heartburn medicine  Nausea and/or vomiting  Severe swelling in your hands, feet, and face    Home Blood Pressure Monitoring for Patients   Your provider has recommended that you check your blood pressure (BP) at least once a week at home. If you do not have a blood pressure cuff at home, one will be provided for you. Contact your provider if you have not received your monitor within 1 week.   Helpful Tips for Accurate Home Blood Pressure Checks  . Don't smoke, exercise, or drink caffeine 30 minutes before checking your BP . Use the restroom before checking your BP (a full bladder  can raise your pressure) . Relax in a comfortable upright chair . Feet on the ground . Left arm resting comfortably on a flat surface at the level of your heart . Legs uncrossed . Back supported . Sit quietly and don't talk . Place the cuff on your bare arm . Adjust snuggly, so that only two fingertips can fit between your skin and the top of the cuff . Check 2 readings separated by at least one minute . Keep a log of your BP readings . For a visual, please reference this diagram: http://ccnc.care/bpdiagram  Provider Name: [facility]     Phone: [phone]  Zone 1: ALL CLEAR  Continue to monitor your symptoms:  . BP reading is less than 140 (top number) or less than 90 (bottom number)  . No right upper stomach pain . No headaches or seeing spots . No feeling nauseated or throwing up . No swelling in face and hands  Zone 2: CAUTION Call your doctor's office for any of the following:  . BP reading is greater than 140 (top number) or greater than 90 (bottom number)  . Stomach pain under your ribs in the middle or right side . Headaches or seeing spots . Feeling nauseated or throwing up . Swelling in face and hands  Zone 3: EMERGENCY  Seek immediate medical care if you have any of the  following:  . BP reading is greater than160 (top number) or greater than 110 (bottom number) . Severe headaches not improving with Tylenol . Serious difficulty catching your breath . Any worsening symptoms from Zone 2  Preterm Labor and Birth Information  The normal length of a pregnancy is 39-41 weeks. Preterm labor is when labor starts before 37 completed weeks of pregnancy. What are the risk factors for preterm labor? Preterm labor is more likely to occur in women who:  Have certain infections during pregnancy such as a bladder infection, sexually transmitted infection, or infection inside the uterus (chorioamnionitis).  Have a shorter-than-normal cervix.  Have gone into preterm  labor before.  Have had surgery on their cervix.  Are younger than age 69 or older than age 11.  Are African American.  Are pregnant with twins or multiple babies (multiple gestation).  Take street drugs or smoke while pregnant.  Do not gain enough weight while pregnant.  Became pregnant shortly after having been pregnant. What are the symptoms of preterm labor? Symptoms of preterm labor include:  Cramps similar to those that can happen during a menstrual period. The cramps may happen with diarrhea.  Pain in the abdomen or lower back.  Regular uterine contractions that may feel like tightening of the abdomen.  A feeling of increased pressure in the pelvis.  Increased watery or bloody mucus discharge from the vagina.  Water breaking (ruptured amniotic sac). Why is it important to recognize signs of preterm labor? It is important to recognize signs of preterm labor because babies who are born prematurely may not be fully developed. This can put them at an increased risk for:  Long-term (chronic) heart and lung problems.  Difficulty immediately after birth with regulating body systems, including blood sugar, body temperature, heart rate, and breathing rate.  Bleeding in the brain.  Cerebral palsy.  Learning difficulties.  Death. These risks are highest for babies who are born before 41 weeks of pregnancy. How is preterm labor treated? Treatment depends on the length of your pregnancy, your condition, and the health of your baby. It may involve: 1. Having a stitch (suture) placed in your cervix to prevent your cervix from opening too early (cerclage). 2. Taking or being given medicines, such as: ? Hormone medicines. These may be given early in pregnancy to help support the pregnancy. ? Medicine to stop contractions. ? Medicines to help mature the baby's lungs. These may be prescribed if the risk of delivery is high. ? Medicines to prevent your baby from developing  cerebral palsy. If the labor happens before 34 weeks of pregnancy, you may need to stay in the hospital. What should I do if I think I am in preterm labor? If you think that you are going into preterm labor, call your health care provider right away. How can I prevent preterm labor in future pregnancies? To increase your chance of having a full-term pregnancy:  Do not use any tobacco products, such as cigarettes, chewing tobacco, and e-cigarettes. If you need help quitting, ask your health care provider.  Do not use street drugs or medicines that have not been prescribed to you during your pregnancy.  Talk with your health care provider before taking any herbal supplements, even if you have been taking them regularly.  Make sure you gain a healthy amount of weight during your pregnancy.  Watch for infection. If you think that you might have an infection, get it checked right away.  Make sure to tell  your health care provider if you have gone into preterm labor before. This information is not intended to replace advice given to you by your health care provider. Make sure you discuss any questions you have with your health care provider. Document Revised: 09/15/2018 Document Reviewed: 10/15/2015 Elsevier Patient Education  [place].

## 2020-09-18 NOTE — Progress Notes (Signed)
TELEHEALTH VIRTUAL OBSTETRICS VISIT ENCOUNTER NOTE Patient name: Sarah Rollins MRN [id number]  Date of birth: [birth year]  I connected with patient on 09/18/20 at 11:30 AM EDT by MyChart video  and verified that I am speaking with the correct person using two identifiers. Pt is not currently in our office, she is at home.  The provider is in the office.    I discussed the limitations, risks, security and privacy concerns of performing an evaluation and management service by telephone and the availability of in person appointments. I also discussed with the patient that there may be a patient responsible charge related to this service. The patient expressed understanding and agreed to proceed.  Chief Complaint:   Routine Prenatal Visit  History of Present Illness:   Sarah Rollins is a 20 y.o. G1P0000 female at [redacted]w[redacted]d with an Estimated Date of Delivery: 10/22/20 being evaluated today for ongoing management of a low-risk pregnancy.  Depression screen [facility] 2/9 07/25/2020 04/16/2020  Decreased Interest 1 0  Down, Depressed, Hopeless 1 0  PHQ - 2 Score 2 0  Altered sleeping 2 1  Tired, decreased energy 2 1  Change in appetite 1 3  Feeling bad or failure about yourself  1 0  Trouble concentrating 1 0  Moving slowly or fidgety/restless 0 0  Suicidal thoughts 0 0  PHQ-9 Score 9 5    Today she reports some mild headaches- doesn't like to take meds, so takes a shower and they resolve. Some black spots occasionally. No RUQ/epigastric pain. Some low/mid back pain, h/o kidney infections, wants to make sure it is not that. Contractions: Irregular. Vag. Bleeding: None.  Movement: Present. denies leaking of fluid. Review of Systems:   Pertinent items are noted in HPI Denies abnormal vaginal discharge w/ itching/odor/irritation, headaches, visual changes, shortness of breath, chest pain, abdominal pain, severe nausea/vomiting, or problems with urination or bowel movements unless otherwise stated  above. Pertinent History Reviewed:  Reviewed past medical,surgical, social, obstetrical and family history.  Reviewed problem list, medications and allergies. Physical Assessment:   Vitals:   09/18/20 1134 09/18/20 1138  BP: (!) 141/94 (!) 131/92  Pulse: 78 80  There is no height or weight on file to calculate BMI.        Physical Examination:   General:  Alert, oriented and cooperative.   Mental Status: Normal mood and affect perceived. Normal judgment and thought content.  Rest of physical exam deferred due to type of encounter  No results found for this or any previous visit (from the past 24 hour(s)).  Assessment & Plan:  1) Pregnancy G1P0000 at [redacted]w[redacted]d with an Estimated Date of Delivery: 10/22/20   2) Elevated bp, to come in to office @ 1:30 for nurse bp/urine check  3) Low/mid back pain>w/ h/o kidney infections per her, will dip urine when she comes   Meds: No orders of the defined types were placed in this encounter.   Labs/procedures today: nurse bp/urine check  Plan:  Continue routine obstetrical care.  Does have home bp cuff. Office bp cuff given: not applicable. Check bp daily, let us know if consistently >140 and/or >90.  Next visit: prefers in person    Reviewed: Preterm labor symptoms and general obstetric precautions including but not limited to vaginal bleeding, contractions, leaking of fluid and fetal movement were reviewed in detail with the patient. The patient was advised to call back or seek an in-person office evaluation/go to [facility] for any urgent  or concerning symptoms. All questions were answered. Please refer to After Visit Summary for other counseling recommendations.    I provided 15 minutes of non-face-to-face time during this encounter.  Follow-up: Return for today for bp check w/ nurse.  No orders of the defined types were placed in this encounter.  Cheral Marker CNM, [facility] 09/18/2020 12:13 PM

## 2020-09-18 NOTE — Progress Notes (Addendum)
   NURSE VISIT- BLOOD PRESSURE CHECK  SUBJECTIVE:  Sarah Rollins is a 20 y.o. G21P0000 female here for BP check. She is [redacted]w[redacted]d pregnant    HYPERTENSION ROS:  Pregnant/postpartum:  . Severe headaches that don't go away with tylenol/other medicines: Never takes meds for pain . Visual changes (seeing spots/double/blurred vision) seeing stars, feels like passing out, BP low . Severe pain under right breast breast or in center of upper chest No  . Severe nausea/vomiting nausea the same as during whole pregnancy . Taking medicines as instructed not applicable  OBJECTIVE:  BP 127/84 (BP Location: Left Arm, Patient Position: Sitting, Cuff Size: Normal)   Pulse (!) 101   Wt 179 lb 6.4 oz (81.4 kg)   LMP 11/27/2019 (Exact Date)   BMI 36.23 kg/m   Appearance alert, well appearing, and in no distress, oriented to person, place, and time and normal appearing weight.  ASSESSMENT: Pregnancy [redacted]w[redacted]d  blood pressure check  PLAN: Discussed with Joellyn Haff, CNM, [facility]   Recommendations: no changes needed   Follow-up: as scheduled   Akela Pocius A Estalee Mccandlish  09/18/2020 2:10 PM   Chart reviewed for nurse visit. Agree with plan of care. BP w/ her cuff here 130/91, so a little discrepancy w/ DBP. Some random headaches, doesn't take meds, just gets in shower and they resolve. Some seeing spots, has checked bp when this happens and it is low. No ruq/epigastric pain. No proteinuria on dipstick today. F/U 1wk in person. Check bp BID and keep log, bring to appt. Reviewed pre-e s/s, reasons to seek care.  Cheral Marker, [place] 09/18/2020 2:31 PM

## 2020-09-22 ENCOUNTER — Other Ambulatory Visit: Payer: Self-pay

## 2020-09-22 ENCOUNTER — Other Ambulatory Visit (INDEPENDENT_AMBULATORY_CARE_PROVIDER_SITE_OTHER): Payer: Medicaid Other

## 2020-09-22 ENCOUNTER — Telehealth: Payer: Self-pay | Admitting: Women's Health

## 2020-09-22 VITALS — BP 136/88 | HR 156 | Wt 185.4 lb

## 2020-09-22 DIAGNOSIS — O0993 Supervision of high risk pregnancy, unspecified, third trimester: Secondary | ICD-10-CM

## 2020-09-22 DIAGNOSIS — Z1389 Encounter for screening for other disorder: Secondary | ICD-10-CM

## 2020-09-22 LAB — POCT URINALYSIS DIPSTICK OB
Blood, UA: NEGATIVE
Glucose, UA: NEGATIVE
Ketones, UA: NEGATIVE
Leukocytes, UA: NEGATIVE
Nitrite, UA: NEGATIVE
POC,PROTEIN,UA: NEGATIVE

## 2020-09-22 NOTE — Telephone Encounter (Signed)
Patient wanted to report High Blood pressure readings of 125/94(am) on 09/22/20.. 139/96 (am) -09/21/20 ,139/86( pm)-4/17/222. Patient sent my chart message and has an scheduled appt on 09/22/20 to get BP & urine check per Joellyn Haff.

## 2020-09-22 NOTE — Progress Notes (Addendum)
   NURSE VISIT- BLOOD PRESSURE CHECK  SUBJECTIVE:  Sarah Rollins is a 20 y.o. G74P0000 female here for BP check. She is [redacted]w[redacted]d pregnant    HYPERTENSION ROS:  Pregnant/postpartum:  . Severe headaches that don't go away with tylenol/other medicines: No  . Visual changes (seeing spots/double/blurred vision) Yes  . Severe pain under right breast breast or in center of upper chest No  . Severe nausea/vomiting No  . Taking medicines as instructed not applicable  OBJECTIVE: 146/94 BP 136/88   Pulse (!) 156   Wt 185 lb 6.4 oz (84.1 kg)   LMP 11/27/2019 (Exact Date)   BMI 37.45 kg/m   Appearance alert, well appearing, and in no distress, oriented to person, place, and time and normal appearing weight.  ASSESSMENT: Pregnancy [redacted]w[redacted]d  blood pressure check  PLAN: Discussed with Sarah Rollins, CNM, [facility]   Recommendations: no changes needed   Follow-up: 1:30 with Sarah Rollins tomorrow  Jvon Meroney A Cambre Matson  09/22/2020 12:22 PM   Chart reviewed for nurse visit. Agree with plan of care.  Cheral Marker, [place] 09/22/2020 1:16 PM

## 2020-09-23 ENCOUNTER — Encounter: Payer: Self-pay | Admitting: Women's Health

## 2020-09-23 ENCOUNTER — Ambulatory Visit (INDEPENDENT_AMBULATORY_CARE_PROVIDER_SITE_OTHER): Payer: Medicaid Other | Admitting: Women's Health

## 2020-09-23 VITALS — BP 127/88 | HR 115 | Wt 184.0 lb

## 2020-09-23 DIAGNOSIS — Z3403 Encounter for supervision of normal first pregnancy, third trimester: Secondary | ICD-10-CM

## 2020-09-23 DIAGNOSIS — Z3A35 35 weeks gestation of pregnancy: Secondary | ICD-10-CM

## 2020-09-23 LAB — POCT URINALYSIS DIPSTICK OB
Blood, UA: NEGATIVE
Glucose, UA: NEGATIVE
Ketones, UA: NEGATIVE
Leukocytes, UA: NEGATIVE
Nitrite, UA: NEGATIVE
POC,PROTEIN,UA: NEGATIVE

## 2020-09-23 NOTE — Progress Notes (Signed)
LOW-RISK PREGNANCY VISIT Patient name: Sarah Rollins MRN [id number]  Date of birth: 10-May-2001 Chief Complaint:   Routine Prenatal Visit  History of Present Illness:   Junia Nygren is a 20 y.o. G32P0000 female at [redacted]w[redacted]d with an Estimated Date of Delivery: 10/22/20 being seen today for ongoing management of a low-risk pregnancy.  Depression screen [facility] 2/9 07/25/2020 04/16/2020  Decreased Interest 1 0  Down, Depressed, Hopeless 1 0  PHQ - 2 Score 2 0  Altered sleeping 2 1  Tired, decreased energy 2 1  Change in appetite 1 3  Feeling bad or failure about yourself  1 0  Trouble concentrating 1 0  Moving slowly or fidgety/restless 0 0  Suicidal thoughts 0 0  PHQ-9 Score 9 5    Today she reports no complaints. Denies ha, visual changes, ruq/epigastric pain, n/v.  Contractions: Irritability. Vag. Bleeding: None.  Movement: Present. denies leaking of fluid. Review of Systems:   Pertinent items are noted in HPI Denies abnormal vaginal discharge w/ itching/odor/irritation, headaches, visual changes, shortness of breath, chest pain, abdominal pain, severe nausea/vomiting, or problems with urination or bowel movements unless otherwise stated above. Pertinent History Reviewed:  Reviewed past medical,surgical, social, obstetrical and family history.  Reviewed problem list, medications and allergies. Physical Assessment:   Vitals:   09/23/20 1345  BP: 127/88  Pulse: (!) 115  Weight: 184 lb (83.5 kg)  Body mass index is 37.16 kg/m.        Physical Examination:   General appearance: Well appearing, and in no distress  Mental status: Alert, oriented to person, place, and time  Skin: Warm & dry  Cardiovascular: Normal heart rate noted  Respiratory: Normal respiratory effort, no distress  Abdomen: Soft, gravid, nontender  Pelvic: Cervical exam deferred         Extremities: Edema: Trace  Fetal Status: Fetal Heart Rate (bpm): 145 Fundal Height: 34 cm Movement: Present    Chaperone: N/A    Results for orders placed or performed in visit on 09/23/20 (from the past 24 hour(s))  POC Urinalysis Dipstick OB   Collection Time: 09/23/20  1:45 PM  Result Value Ref Range   Color, UA     Clarity, UA     Glucose, UA Negative Negative   Bilirubin, UA     Ketones, UA neg    Spec Grav, UA     Blood, UA neg    pH, UA     POC,PROTEIN,UA Negative Negative, Trace, Small (1+), Moderate (2+), Large (3+), 4+   Urobilinogen, UA     Nitrite, UA neg    Leukocytes, UA Negative Negative   Appearance     Odor      Assessment & Plan:  1) Low-risk pregnancy G1P0000 at [redacted]w[redacted]d with an Estimated Date of Delivery: 10/22/20   2) Elevated home bp's, normal here except yesterday nurse visit x1 (normal on repeat). Asymptomatic, no proteinuria. Stop checking w/ her cuff. Reviewed pre-e s/s, reasons to seek care.    Meds: No orders of the defined types were placed in this encounter.  Labs/procedures today: none  Plan:  Continue routine obstetrical care  Next visit: prefers will be in person for cultures    Reviewed: Preterm labor symptoms and general obstetric precautions including but not limited to vaginal bleeding, contractions, leaking of fluid and fetal movement were reviewed in detail with the patient.  All questions were answered.   Follow-up: Return for As scheduled.  Future Appointments  Date Time Provider Department Center  09/29/2020  3:50 PM Eure, Amaryllis Dyke, MD [facility]    Orders Placed This Encounter  Procedures  . POC Urinalysis Dipstick OB   Cheral Marker CNM, [facility] 09/23/2020 2:44 PM

## 2020-09-23 NOTE — Patient Instructions (Signed)
Sarah Rollins, I greatly value your feedback.  If you receive a survey following your visit with us today, we appreciate you taking the time to fill it out.  Thanks, Kim Abeera Flannery, CNM, WHNP-BC  [facility] [place] ([address] [place], [place] [address]) Entrance C, located off of [address] Free 24/7 valet parking   Go to [email or web address] to register for FREE online childbirth classes    Call the office ([phone]) or go to [facility] if:  You begin to have strong, frequent contractions  Your water breaks.  Sometimes it is a big gush of fluid, sometimes it is just a trickle that keeps getting your panties wet or running down your legs  You have vaginal bleeding.  It is normal to have a small amount of spotting if your cervix was checked.   You don't feel your baby moving like normal.  If you don't, get you something to eat and drink and lay down and focus on feeling your baby move.  You should feel at least 10 movements in 2 hours.  If you don't, you should call the office or go to [facility].   Call the office ([phone]) or go to [facility] for these signs of pre-eclampsia:  Severe headache that does not go away with Tylenol  Visual changes- seeing spots, double, blurred vision  Pain under your right breast or upper abdomen that does not go away with Tums or heartburn medicine  Nausea and/or vomiting  Severe swelling in your hands, feet, and face    Home Blood Pressure Monitoring for Patients   Your provider has recommended that you check your blood pressure (BP) at least once a week at home. If you do not have a blood pressure cuff at home, one will be provided for you. Contact your provider if you have not received your monitor within 1 week.   Helpful Tips for Accurate Home Blood Pressure Checks  . Don't smoke, exercise, or drink caffeine 30 minutes before checking your BP . Use the restroom before checking your BP (a full bladder  can raise your pressure) . Relax in a comfortable upright chair . Feet on the ground . Left arm resting comfortably on a flat surface at the level of your heart . Legs uncrossed . Back supported . Sit quietly and don't talk . Place the cuff on your bare arm . Adjust snuggly, so that only two fingertips can fit between your skin and the top of the cuff . Check 2 readings separated by at least one minute . Keep a log of your BP readings . For a visual, please reference this diagram: http://ccnc.care/bpdiagram  Provider Name: [facility]     Phone: [phone]  Zone 1: ALL CLEAR  Continue to monitor your symptoms:  . BP reading is less than 140 (top number) or less than 90 (bottom number)  . No right upper stomach pain . No headaches or seeing spots . No feeling nauseated or throwing up . No swelling in face and hands  Zone 2: CAUTION Call your doctor's office for any of the following:  . BP reading is greater than 140 (top number) or greater than 90 (bottom number)  . Stomach pain under your ribs in the middle or right side . Headaches or seeing spots . Feeling nauseated or throwing up . Swelling in face and hands  Zone 3: EMERGENCY  Seek immediate medical care if you have any of the   following:  . BP reading is greater than160 (top number) or greater than 110 (bottom number) . Severe headaches not improving with Tylenol . Serious difficulty catching your breath . Any worsening symptoms from Zone 2  Preterm Labor and Birth Information  The normal length of a pregnancy is 39-41 weeks. Preterm labor is when labor starts before 37 completed weeks of pregnancy. What are the risk factors for preterm labor? Preterm labor is more likely to occur in women who:  Have certain infections during pregnancy such as a bladder infection, sexually transmitted infection, or infection inside the uterus (chorioamnionitis).  Have a shorter-than-normal cervix.  Have gone into preterm  labor before.  Have had surgery on their cervix.  Are younger than age 69 or older than age 11.  Are African American.  Are pregnant with twins or multiple babies (multiple gestation).  Take street drugs or smoke while pregnant.  Do not gain enough weight while pregnant.  Became pregnant shortly after having been pregnant. What are the symptoms of preterm labor? Symptoms of preterm labor include:  Cramps similar to those that can happen during a menstrual period. The cramps may happen with diarrhea.  Pain in the abdomen or lower back.  Regular uterine contractions that may feel like tightening of the abdomen.  A feeling of increased pressure in the pelvis.  Increased watery or bloody mucus discharge from the vagina.  Water breaking (ruptured amniotic sac). Why is it important to recognize signs of preterm labor? It is important to recognize signs of preterm labor because babies who are born prematurely may not be fully developed. This can put them at an increased risk for:  Long-term (chronic) heart and lung problems.  Difficulty immediately after birth with regulating body systems, including blood sugar, body temperature, heart rate, and breathing rate.  Bleeding in the brain.  Cerebral palsy.  Learning difficulties.  Death. These risks are highest for babies who are born before 41 weeks of pregnancy. How is preterm labor treated? Treatment depends on the length of your pregnancy, your condition, and the health of your baby. It may involve: 1. Having a stitch (suture) placed in your cervix to prevent your cervix from opening too early (cerclage). 2. Taking or being given medicines, such as: ? Hormone medicines. These may be given early in pregnancy to help support the pregnancy. ? Medicine to stop contractions. ? Medicines to help mature the baby's lungs. These may be prescribed if the risk of delivery is high. ? Medicines to prevent your baby from developing  cerebral palsy. If the labor happens before 34 weeks of pregnancy, you may need to stay in the hospital. What should I do if I think I am in preterm labor? If you think that you are going into preterm labor, call your health care provider right away. How can I prevent preterm labor in future pregnancies? To increase your chance of having a full-term pregnancy:  Do not use any tobacco products, such as cigarettes, chewing tobacco, and e-cigarettes. If you need help quitting, ask your health care provider.  Do not use street drugs or medicines that have not been prescribed to you during your pregnancy.  Talk with your health care provider before taking any herbal supplements, even if you have been taking them regularly.  Make sure you gain a healthy amount of weight during your pregnancy.  Watch for infection. If you think that you might have an infection, get it checked right away.  Make sure to tell  your health care provider if you have gone into preterm labor before. This information is not intended to replace advice given to you by your health care provider. Make sure you discuss any questions you have with your health care provider. Document Revised: 09/15/2018 Document Reviewed: 10/15/2015 Elsevier Patient Education  [place].

## 2020-09-29 ENCOUNTER — Other Ambulatory Visit (HOSPITAL_COMMUNITY)
Admission: RE | Admit: 2020-09-29 | Discharge: 2020-09-29 | Disposition: A | Discharge status: Home / Self Care | Payer: Medicaid Other | Source: Ambulatory Visit | Attending: Obstetrics & Gynecology | Admitting: Obstetrics & Gynecology

## 2020-09-29 ENCOUNTER — Ambulatory Visit (INDEPENDENT_AMBULATORY_CARE_PROVIDER_SITE_OTHER): Payer: Medicaid Other | Admitting: Obstetrics & Gynecology

## 2020-09-29 ENCOUNTER — Encounter: Payer: Self-pay | Admitting: Obstetrics & Gynecology

## 2020-09-29 ENCOUNTER — Other Ambulatory Visit: Payer: Self-pay

## 2020-09-29 VITALS — BP 123/80 | HR 78 | Wt 182.5 lb

## 2020-09-29 DIAGNOSIS — Z3403 Encounter for supervision of normal first pregnancy, third trimester: Secondary | ICD-10-CM | POA: Diagnosis present

## 2020-09-29 DIAGNOSIS — K645 Perianal venous thrombosis: Secondary | ICD-10-CM | POA: Diagnosis not present

## 2020-09-29 DIAGNOSIS — Z3A36 36 weeks gestation of pregnancy: Secondary | ICD-10-CM

## 2020-09-29 NOTE — Progress Notes (Signed)
    LOW-RISK PREGNANCY VISIT Patient name: Sarah Rollins MRN [id number]  Date of birth: 2001-02-01 Chief Complaint:   Routine Prenatal Visit  History of Present Illness:   Sarah Rollins is a 20 y.o. G34P0000 female at [redacted]w[redacted]d with an Estimated Date of Delivery: 10/22/20 being seen today for ongoing management of a low-risk pregnancy.  Depression screen [facility] 2/9 07/25/2020 04/16/2020  Decreased Interest 1 0  Down, Depressed, Hopeless 1 0  PHQ - 2 Score 2 0  Altered sleeping 2 1  Tired, decreased energy 2 1  Change in appetite 1 3  Feeling bad or failure about yourself  1 0  Trouble concentrating 1 0  Moving slowly or fidgety/restless 0 0  Suicidal thoughts 0 0  PHQ-9 Score 9 5    Today she reports hemorrhoid. Contractions: Irritability. Vag. Bleeding: None.  Movement: Present. denies leaking of fluid. Review of Systems:   Pertinent items are noted in HPI Denies abnormal vaginal discharge w/ itching/odor/irritation, headaches, visual changes, shortness of breath, chest pain, abdominal pain, severe nausea/vomiting, or problems with urination or bowel movements unless otherwise stated above. Pertinent History Reviewed:  Reviewed past medical,surgical, social, obstetrical and family history.  Reviewed problem list, medications and allergies. Physical Assessment:   Vitals:   09/29/20 1608  BP: 123/80  Pulse: 78  Weight: 182 lb 8 oz (82.8 kg)  Body mass index is 36.86 kg/m.        Physical Examination:   General appearance: Well appearing, and in no distress  Mental status: Alert, oriented to person, place, and time  Skin: Warm & dry  Cardiovascular: Normal heart rate noted  Respiratory: Normal respiratory effort, no distress  Abdomen: Soft, gravid, nontender  Pelvic: Cervical exam deferred         Extremities: Edema: Mild pitting, slight indentation  Fetal Status:     Movement: Present    Chaperone: Clint Bolder, RN    No results found for this or any previous visit (from the past  24 hour(s)).  Assessment & Plan:  1) Low-risk pregnancy G1P0000 at [redacted]w[redacted]d with an Estimated Date of Delivery: 10/22/20   2) Thrombosed hemorrhoid, I&D with #11 blade and evacuated clot, completely, now bleeding a bit   Meds: No orders of the defined types were placed in this encounter.  Labs/procedures today: cultures done   Plan:  Continue routine obstetrical care  Next visit: prefers in person    Reviewed: Preterm labor symptoms and general obstetric precautions including but not limited to vaginal bleeding, contractions, leaking of fluid and fetal movement were reviewed in detail with the patient.  All questions were answered. Has home bp cuff. Rx faxed to . Check bp weekly, let us know if >140/90.   Follow-up: Return in about 1 week (around 10/06/2020) for LROB.  Orders Placed This Encounter  Procedures  . Culture, beta strep (group b only)    Lazaro Arms, MD 09/29/2020 4:49 PM

## 2020-10-01 LAB — CERVICOVAGINAL ANCILLARY ONLY
Chlamydia: NEGATIVE
Comment: NEGATIVE
Comment: NORMAL
Neisseria Gonorrhea: NEGATIVE

## 2020-10-04 LAB — CULTURE, BETA STREP (GROUP B ONLY): Strep Gp B Culture: NEGATIVE

## 2020-10-08 ENCOUNTER — Inpatient Hospital Stay (HOSPITAL_COMMUNITY)
Admission: AD | Admit: 2020-10-08 | Discharge: 2020-10-11 | DRG: 807 | Disposition: A | Discharge status: Home / Self Care | Payer: Medicaid Other | Attending: Family Medicine | Admitting: Family Medicine

## 2020-10-08 ENCOUNTER — Encounter (HOSPITAL_COMMUNITY): Payer: Self-pay | Admitting: Obstetrics & Gynecology

## 2020-10-08 ENCOUNTER — Encounter: Payer: Self-pay | Admitting: Obstetrics & Gynecology

## 2020-10-08 ENCOUNTER — Ambulatory Visit (INDEPENDENT_AMBULATORY_CARE_PROVIDER_SITE_OTHER): Payer: Medicaid Other | Admitting: Obstetrics & Gynecology

## 2020-10-08 ENCOUNTER — Other Ambulatory Visit: Payer: Self-pay

## 2020-10-08 VITALS — BP 138/92 | HR 108 | Wt 185.8 lb

## 2020-10-08 DIAGNOSIS — Z975 Presence of (intrauterine) contraceptive device: Secondary | ICD-10-CM

## 2020-10-08 DIAGNOSIS — R030 Elevated blood-pressure reading, without diagnosis of hypertension: Secondary | ICD-10-CM | POA: Diagnosis present

## 2020-10-08 DIAGNOSIS — O134 Gestational [pregnancy-induced] hypertension without significant proteinuria, complicating childbirth: Principal | ICD-10-CM | POA: Diagnosis present

## 2020-10-08 DIAGNOSIS — Z8616 Personal history of COVID-19: Secondary | ICD-10-CM | POA: Diagnosis not present

## 2020-10-08 DIAGNOSIS — A749 Chlamydial infection, unspecified: Secondary | ICD-10-CM | POA: Diagnosis present

## 2020-10-08 DIAGNOSIS — F1290 Cannabis use, unspecified, uncomplicated: Secondary | ICD-10-CM | POA: Diagnosis present

## 2020-10-08 DIAGNOSIS — O139 Gestational [pregnancy-induced] hypertension without significant proteinuria, unspecified trimester: Secondary | ICD-10-CM | POA: Diagnosis present

## 2020-10-08 DIAGNOSIS — O99324 Drug use complicating childbirth: Secondary | ICD-10-CM | POA: Diagnosis present

## 2020-10-08 DIAGNOSIS — R87610 Atypical squamous cells of undetermined significance on cytologic smear of cervix (ASC-US): Secondary | ICD-10-CM | POA: Diagnosis present

## 2020-10-08 DIAGNOSIS — Z20822 Contact with and (suspected) exposure to covid-19: Secondary | ICD-10-CM | POA: Diagnosis present

## 2020-10-08 DIAGNOSIS — Z8759 Personal history of other complications of pregnancy, childbirth and the puerperium: Secondary | ICD-10-CM | POA: Diagnosis present

## 2020-10-08 DIAGNOSIS — Z3403 Encounter for supervision of normal first pregnancy, third trimester: Secondary | ICD-10-CM

## 2020-10-08 DIAGNOSIS — O368130 Decreased fetal movements, third trimester, not applicable or unspecified: Secondary | ICD-10-CM | POA: Diagnosis present

## 2020-10-08 DIAGNOSIS — R87619 Unspecified abnormal cytological findings in specimens from cervix uteri: Secondary | ICD-10-CM | POA: Diagnosis present

## 2020-10-08 DIAGNOSIS — Z30430 Encounter for insertion of intrauterine contraceptive device: Secondary | ICD-10-CM | POA: Diagnosis not present

## 2020-10-08 DIAGNOSIS — Z3A38 38 weeks gestation of pregnancy: Secondary | ICD-10-CM | POA: Diagnosis not present

## 2020-10-08 DIAGNOSIS — Z3400 Encounter for supervision of normal first pregnancy, unspecified trimester: Secondary | ICD-10-CM

## 2020-10-08 DIAGNOSIS — Z3401 Encounter for supervision of normal first pregnancy, first trimester: Secondary | ICD-10-CM

## 2020-10-08 DIAGNOSIS — O133 Gestational [pregnancy-induced] hypertension without significant proteinuria, third trimester: Secondary | ICD-10-CM

## 2020-10-08 LAB — POCT URINALYSIS DIPSTICK OB
Blood, UA: NEGATIVE
Glucose, UA: NEGATIVE
Ketones, UA: NEGATIVE
Leukocytes, UA: NEGATIVE
Nitrite, UA: NEGATIVE
POC,PROTEIN,UA: NEGATIVE

## 2020-10-08 LAB — TYPE AND SCREEN
ABO/RH(D): O POS
Antibody Screen: NEGATIVE

## 2020-10-08 LAB — CBC
HCT: 38 % (ref 36.0–46.0)
Hemoglobin: 12.8 g/dL (ref 12.0–15.0)
MCH: 31.3 pg (ref 26.0–34.0)
MCHC: 33.7 g/dL (ref 30.0–36.0)
MCV: 92.9 fL (ref 80.0–100.0)
Platelets: 283 10*3/uL (ref 150–400)
RBC: 4.09 MIL/uL (ref 3.87–5.11)
RDW: 12.4 % (ref 11.5–15.5)
WBC: 12.2 10*3/uL — ABNORMAL HIGH (ref 4.0–10.5)
nRBC: 0 % (ref 0.0–0.2)

## 2020-10-08 LAB — COMPREHENSIVE METABOLIC PANEL
ALT: 9 U/L (ref 0–44)
AST: 16 U/L (ref 15–41)
Albumin: 2.8 g/dL — ABNORMAL LOW (ref 3.5–5.0)
Alkaline Phosphatase: 150 U/L — ABNORMAL HIGH (ref 38–126)
Anion gap: 8 (ref 5–15)
BUN: 20 mg/dL (ref 6–20)
CO2: 25 mmol/L (ref 22–32)
Calcium: 9.1 mg/dL (ref 8.9–10.3)
Chloride: 100 mmol/L (ref 98–111)
Creatinine, Ser: 0.77 mg/dL (ref 0.44–1.00)
GFR, Estimated: >60 mL/min (ref >60)
Glucose, Bld: 93 mg/dL (ref 70–99)
Potassium: 4.5 mmol/L (ref 3.5–5.1)
Sodium: 133 mmol/L — ABNORMAL LOW (ref 135–145)
Total Bilirubin: 0.6 mg/dL (ref 0.3–1.2)
Total Protein: 6.7 g/dL (ref 6.5–8.1)

## 2020-10-08 LAB — RESP PANEL BY RT-PCR (FLU A&B, COVID) ARPGX2
Influenza A by PCR: NEGATIVE
Influenza B by PCR: NEGATIVE
SARS Coronavirus 2 by RT PCR: NEGATIVE

## 2020-10-08 LAB — PROTEIN / CREATININE RATIO, URINE
Creatinine, Urine: 211.11 mg/dL
Protein Creatinine Ratio: 0.03 mg/mg{Cre} (ref 0.00–0.15)
Total Protein, Urine: 7 mg/dL

## 2020-10-08 MED ORDER — MISOPROSTOL 50MCG HALF TABLET
50.0000 ug | ORAL_TABLET | ORAL | Status: DC
  Administered 2020-10-08 (×2): 50 ug via BUCCAL
  Filled 2020-10-08 (×2): qty 1

## 2020-10-08 MED ORDER — ONDANSETRON HCL 4 MG/2ML IJ SOLN
4.0000 mg | Freq: Four times a day (QID) | INTRAMUSCULAR | Status: DC | PRN
  Administered 2020-10-09 (×3): 4 mg via INTRAVENOUS
  Filled 2020-10-08 (×3): qty 2

## 2020-10-08 MED ORDER — LIDOCAINE HCL (PF) 1 % IJ SOLN: 30.0000 mL | INTRAMUSCULAR | Status: DC | PRN

## 2020-10-08 MED ORDER — LEVONORGESTREL 20.1 MCG/DAY IU IUD
1.0000 | INTRAUTERINE_SYSTEM | Freq: Once | INTRAUTERINE | Status: AC
  Administered 2020-10-09: 1 via INTRAUTERINE
  Filled 2020-10-08: qty 1

## 2020-10-08 MED ORDER — LACTATED RINGERS IV SOLN: 500.0000 mL | INTRAVENOUS | Status: DC | PRN

## 2020-10-08 MED ORDER — OXYCODONE-ACETAMINOPHEN 5-325 MG PO TABS: 2.0000 | ORAL_TABLET | ORAL | Status: DC | PRN

## 2020-10-08 MED ORDER — OXYTOCIN BOLUS FROM INFUSION
333.0000 mL | Freq: Once | INTRAVENOUS | Status: AC
  Administered 2020-10-09: 333 mL via INTRAVENOUS

## 2020-10-08 MED ORDER — OXYTOCIN-SODIUM CHLORIDE 30-0.9 UT/500ML-% IV SOLN: 2.5000 [IU]/h | INTRAVENOUS | Status: DC

## 2020-10-08 MED ORDER — FENTANYL CITRATE (PF) 100 MCG/2ML IJ SOLN
100.0000 ug | INTRAMUSCULAR | Status: DC | PRN
  Administered 2020-10-08 – 2020-10-09 (×7): 100 ug via INTRAVENOUS
  Filled 2020-10-08 (×7): qty 2

## 2020-10-08 MED ORDER — TERBUTALINE SULFATE 1 MG/ML IJ SOLN: 0.2500 mg | Freq: Once | INTRAMUSCULAR | Status: DC | PRN

## 2020-10-08 MED ORDER — OXYCODONE-ACETAMINOPHEN 5-325 MG PO TABS: 1.0000 | ORAL_TABLET | ORAL | Status: DC | PRN

## 2020-10-08 MED ORDER — SOD CITRATE-CITRIC ACID 500-334 MG/5ML PO SOLN
30.0000 mL | ORAL | Status: DC | PRN
  Administered 2020-10-09: 30 mL via ORAL
  Filled 2020-10-08: qty 15

## 2020-10-08 MED ORDER — LACTATED RINGERS IV SOLN: INTRAVENOUS | Status: DC

## 2020-10-08 MED ORDER — ACETAMINOPHEN 325 MG PO TABS: 650.0000 mg | ORAL_TABLET | ORAL | Status: DC | PRN

## 2020-10-08 NOTE — Progress Notes (Signed)
LOW-RISK PREGNANCY VISIT Patient name: Mykal Kirchman MRN [id number]  Date of birth: 10-29-2000 Chief Complaint:   Routine Prenatal Visit  History of Present Illness:   Raigen Jagielski is a 20 y.o. G1P0000 female at [redacted]w[redacted]d with an Estimated Date of Delivery: 10/22/20 being seen today for ongoing management of a low-risk pregnancy.  Depression screen [facility] 2/9 07/25/2020 04/16/2020  Decreased Interest 1 0  Down, Depressed, Hopeless 1 0  PHQ - 2 Score 2 0  Altered sleeping 2 1  Tired, decreased energy 2 1  Change in appetite 1 3  Feeling bad or failure about yourself  1 0  Trouble concentrating 1 0  Moving slowly or fidgety/restless 0 0  Suicidal thoughts 0 0  PHQ-9 Score 9 5    Today she reports decreased fetal movement. Contractions: Irritability. Maybe contraction every 20-64min.   Vag. Bleeding: None.  Movement: (!) Decreased. Notes not really feeling movement today.  Denies leaking of fluid. Review of Systems:   Pertinent items are noted in HPI Denies abnormal vaginal discharge w/ itching/odor/irritation, headaches, visual changes, notes fatigue, denies shortness of breath, chest pain, abdominal pain, severe nausea/vomiting, or problems with urination or bowel movements unless otherwise stated above. Pertinent History Reviewed:  Reviewed past medical,surgical, social, obstetrical and family history.  Reviewed problem list, medications and allergies.  Physical Assessment:   Vitals:   10/08/20 1348 10/08/20 1351  BP: (!) 148/93 (!) 138/92  Pulse: (!) 102 (!) 108  Weight: 185 lb 12.8 oz (84.3 kg)   Body mass index is 37.53 kg/m.        Physical Examination:   General appearance: Well appearing, and in no distress  Mental status: Alert, oriented to person, place, and time  Skin: Warm & dry  Respiratory: Normal respiratory effort, no distress  Abdomen: Soft, gravid, nontender  Pelvic: Cervical exam performed  Dilation: Fingertip Effacement (%): Thick Station: -3  Extremities: Edema:  Trace  Psych:  mood and affect appropriate  Fetal Status: Fetal Heart Rate (bpm): 140 Fundal Height: 37 cm Movement: (!) Decreased    Chaperone: declined    Results for orders placed or performed in visit on 10/08/20 (from the past 24 hour(s))  POC Urinalysis Dipstick OB   Collection Time: 10/08/20  1:53 PM  Result Value Ref Range   Color, UA     Clarity, UA     Glucose, UA Negative Negative   Bilirubin, UA     Ketones, UA neg    Spec Grav, UA     Blood, UA neg    pH, UA     POC,PROTEIN,UA Negative Negative, Trace, Small (1+), Moderate (2+), Large (3+), 4+   Urobilinogen, UA     Nitrite, UA neg    Leukocytes, UA Negative Negative   Appearance     Odor       Assessment & Plan:  1) Low-risk pregnancy G1P0000 at [redacted]w[redacted]d with an Estimated Date of Delivery: 10/22/20   2) Elevated BP- Pt also noting decreased fetal movement.  Pt sent to [facility] for further evaluation- reviewed with patient that she may stay for IOL due to gestational HTN   Meds: No orders of the defined types were placed in this encounter.  Labs/procedures today: none  Plan:  Pt sent to [facility]U for further evaluation and possible IOL  Next visit: prefers in person    Reviewed: Term labor symptoms and general obstetric precautions including but not limited to vaginal bleeding, contractions, leaking of fluid and fetal movement were  reviewed in detail with the patient.  All questions were answered. Pt has home bp cuff. Check bp weekly, let us know if >140/90.   Follow-up: Return in about 1 week (around 10/15/2020) for LROB visit.  Orders Placed This Encounter  Procedures  . POC Urinalysis Dipstick OB    Myna Hidalgo, DO Attending Obstetrician & Gynecologist, [facility] for [employer], [facility]

## 2020-10-08 NOTE — Progress Notes (Signed)
Sarah Rollins is a 20 y.o. G1P0000 at [redacted]w[redacted]d by early ultrasound admitted for induction of labor due to [facility].  Subjective: Pt comfortable, no cramping/contractions or leaking fluid. She is feeling fetal movement.  S/O in room for support.  Objective: BP (!) 126/93   Pulse 100   Temp 98.6 F (37 C) (Oral)   Resp 18   Ht 5\' 1"  (1.549 m)   Wt 84.2 kg   LMP 11/27/2019 (Exact Date)   SpO2 97%   BMI 35.07 kg/m  No intake/output data recorded. No intake/output data recorded.  FHT:  FHR: 130 bpm, variability: moderate,  accelerations:  Present,  decelerations:  Absent UC:   Rare SVE:   Dilation: 1 Effacement (%): 50 Station: -1 Exam by:: [email or web address] Leftwich-Kirby CNM Foley bulb placed without difficult. Pt tolerated well.  Labs: Lab Results  Component Value Date   WBC 12.2 (H) 10/08/2020   HGB 12.8 10/08/2020   HCT 38.0 10/08/2020   MCV 92.9 10/08/2020   PLT 283 10/08/2020    Assessment / Plan: Induction of labor due to GHTN GBS negative S/P Foley bulb insertion  Labor: Progressing normally Preeclampsia:  labs stable Fetal Wellbeing:  Category I Pain Control:  none I/D:  GBS neg Anticipated MOD:  NSVD  12/08/2020 10/08/2020, 5:42 PM

## 2020-10-08 NOTE — MAU Provider Note (Addendum)
 History     CSN: [id number]  Arrival date and time: 10/08/20 1455   Event Date/Time   First Provider Initiated Contact with Patient 10/08/20 1541      Chief Complaint  Patient presents with  . Hypertension   [name] is a 20 y.o. year old G1P0000 female at [redacted]w[redacted]d weeks gestation who was sent to MAU from [facility] after having elevated blood pressures in the office today at 1350; 148/93 and at 1355 138/92.  She also reports increased swelling in the feet feeling tired and nauseated.  She also complains of decreased fetal movement x2 days.  She states that she did not think much of the decreased fetal movement since she was having an appointment coming up. She was offered a nonstress test in the office but opted to come to MAU in case she was admitted.  She states "I have not been feeling him move as much as usual."  She also reports that her mom has a history of preeclampsia without "spilling protein in her urine" so she is concerned that this may be happening to her at this time.  Chart review reveals blood pressures at 24 weeks and 1 day of 141/90, 35 weeks and 1 day 141/94 and 132/92, 35 weeks and 5 days 146/94.   OB History    Gravida  1   Para  0   Term  0   Preterm  0   AB  0   Living  0     SAB  0   IAB  0   Ectopic  0   Multiple  0   Live Births  0           Past Medical History:  Diagnosis Date  . ADHD     Past Surgical History:  Procedure Laterality Date  . WISDOM TOOTH EXTRACTION      Family History  Problem Relation Age of Onset  . Depression Sister   . Anxiety disorder Sister   . Hypertension Mother   . Heart Problems Mother   . Colon cancer Maternal Grandfather   . Breast cancer Paternal Grandmother     Social History   Tobacco Use  . Smoking status: Never Smoker  . Smokeless tobacco: Never Used  Vaping Use  . Vaping Use: Never used  Substance Use Topics  . Alcohol use: Not Currently  . Drug use: Yes    Types:  Marijuana    Allergies:  Allergies  Allergen Reactions  . Ciprofloxacin Rash    Medications Prior to Admission  Medication Sig Dispense Refill Last Dose  . pantoprazole (PROTONIX) 20 MG tablet Take 1 tablet (20 mg total) by mouth daily. 30 tablet 3 10/07/2020 at Unknown time  . Prenatal Vit-Fe Fumarate-FA (PRENATAL VITAMIN PO) Take by mouth.    10/07/2020 at Unknown time  . Blood Pressure Monitor MISC For regular home bp monitoring during pregnancy 1 each 0   . clotrimazole (CLOTRIMAZOLE ANTI-FUNGAL) 1 % cream Apply 1 application topically 2 (two) times daily. (Patient not taking: Reported on 10/08/2020) 30 g 1   . Doxylamine-Pyridoxine 10-10 MG TBEC Take 1 tablet by mouth 2 (two) times daily. Take 1 tablet by mouth in the morning, at noon, and at bedtime. Two tablets at bedtime on day 1 and 2; if symptoms persist, take 1 tablet in morning and 2 tablets at bedtime on day 3; if symptoms persist, may increase to 1 tablet in morning, 1 tablet mid-afternoon, and 2   tablets at bedtime on day 4 (maximum: doxylamine 40 mg/pyridoxine 40 mg (4 tablets) per day). Take on empty stomach with full glass of water. (Patient not taking: Reported on 10/08/2020) 60 tablet 6   . ondansetron (ZOFRAN ODT) 4 MG disintegrating tablet Take 1 tablet (4 mg total) by mouth every 6 (six) hours as needed for nausea. (Patient not taking: Reported on 10/08/2020) 30 tablet 2     Review of Systems  Constitutional: Positive for fatigue.  HENT: Negative.   Eyes: Negative.   Respiratory: Negative.   Cardiovascular: Positive for leg swelling.  Gastrointestinal: Positive for nausea.  Endocrine: Negative.   Genitourinary:       DFM x 2 days  Musculoskeletal: Negative.        Swelling of hands  Skin: Negative.   Allergic/Immunologic: Negative.   Neurological: Negative.   Hematological: Negative.   Psychiatric/Behavioral: Negative.    Physical Exam   Patient Vitals for the past 24 hrs:  BP Temp Temp src Pulse Resp SpO2 Height  Weight  10/08/20 1601 132/88 -- -- (!) 120 -- -- -- --  10/08/20 1546 (!) 143/99 -- -- (!) 123 -- -- -- --  10/08/20 1534 (!) 145/93 -- -- (!) 116 -- -- -- --  10/08/20 1519 134/83 98.6 F (37 C) Oral (!) 116 18 97 % 5' 1" (1.549 m) 84.2 kg    Physical Exam Vitals and nursing note reviewed.  Constitutional:      Appearance: Normal appearance. She is normal weight.  Cardiovascular:     Rate and Rhythm: Tachycardia present.     Pulses: Normal pulses.     Heart sounds: Normal heart sounds.  Pulmonary:     Effort: Pulmonary effort is normal.     Breath sounds: Normal breath sounds.  Abdominal:     Palpations: Abdomen is soft.  Musculoskeletal:        General: Normal range of motion.  Skin:    General: Skin is warm and dry.  Neurological:     Mental Status: She is alert and oriented to person, place, and time.  Psychiatric:        Mood and Affect: Mood normal.        Behavior: Behavior normal.        Thought Content: Thought content normal.        Judgment: Judgment normal.    REACTIVE NST - FHR: 140 bpm / moderate variability / accels present / decels absent / TOCO: Occ UI noted MAU Course  Procedures Patient informed that the ultrasound is considered a limited OB ultrasound and is not intended to be a complete ultrasound exam.  Patient also informed that the ultrasound is not being completed with the intent of assessing for fetal or placental anomalies or any pelvic abnormalities.  Explained that the purpose of today's ultrasound is to assess for presentation.  Baby was found to be in a cephalic direct OP presentation. Patient acknowledges the purpose of the exam and the limitations of the study.  MDM CCUA -- from [facility] CBC CMP P/C Ratio Serial BP's   Results for orders placed or performed during the hospital encounter of 10/08/20 (from the past 24 hour(s))  CBC     Status: Abnormal   Collection Time: 10/08/20  3:50 PM  Result Value Ref Range   WBC 12.2 (H) 4.0 -  10.5 K/uL   RBC 4.09 3.87 - 5.11 MIL/uL   Hemoglobin 12.8 12.0 - 15.0 g/dL   HCT 38.0 36.0 -   46.0 %   MCV 92.9 80.0 - 100.0 fL   MCH 31.3 26.0 - 34.0 pg   MCHC 33.7 30.0 - 36.0 g/dL   RDW 12.4 11.5 - 15.5 %   Platelets 283 150 - 400 K/uL   nRBC 0.0 0.0 - 0.2 %      Assessment and Plan  Gestational hypertension, third trimester - Admit to L&D - Routine orders - PEC labs pending - Report given and care assumed by Lisa Leftwich-Kirby, CNM  Charlesia Canaday, CNM 10/08/2020, 3:42 PM  

## 2020-10-08 NOTE — MAU Note (Signed)
BP elevated today in office.  Denies HA< visual changes, epigastric pain.  Reports increased swelling in hands and feet.  Feels tired and nauseated.

## 2020-10-08 NOTE — Progress Notes (Signed)
Patient ID: Sarah Rollins, female   DOB: [birth year], 20 y.o.   MRN: [id number]  In to introduce self to pt and family; cervical foley still in place; recently received 2nd dose of Fentanyl; s/p cytotec x 1  BPs 126/75, 110/73 FHR 120s, +accels, no decels Ctx irreg, mild Cx deferred  IUP@38 .0wks gHTN IOL process  Plan for next cytotec dosing, then Pitocin when the foley comes out Anticipate vag del  [name] [facility] 10/08/2020

## 2020-10-08 NOTE — H&P (Signed)
History     CSN: [id number]  Arrival date and time: 10/08/20 1455   Event Date/Time   First Provider Initiated Contact with Patient 10/08/20 1541      Chief Complaint  Patient presents with  . Hypertension   [name] is a 20 y.o. year old G32P0000 female at [redacted]w[redacted]d weeks gestation who was sent to MAU from [facility] after having elevated blood pressures in the office today at 1350; 148/93 and at 1355 138/92.  She also reports increased swelling in the feet feeling tired and nauseated.  She also complains of decreased fetal movement x2 days.  She states that she did not think much of the decreased fetal movement since she was having an appointment coming up. She was offered a nonstress test in the office but opted to come to MAU in case she was admitted.  She states "I have not been feeling him move as much as usual."  She also reports that her mom has a history of preeclampsia without "spilling protein in her urine" so she is concerned that this may be happening to her at this time.  Chart review reveals blood pressures at 24 weeks and 1 day of 141/90, 35 weeks and 1 day 141/94 and 132/92, 35 weeks and 5 days 146/94.   OB History    Gravida  1   Para  0   Term  0   Preterm  0   AB  0   Living  0     SAB  0   IAB  0   Ectopic  0   Multiple  0   Live Births  0           Past Medical History:  Diagnosis Date  . ADHD     Past Surgical History:  Procedure Laterality Date  . WISDOM TOOTH EXTRACTION      Family History  Problem Relation Age of Onset  . Depression Sister   . Anxiety disorder Sister   . Hypertension Mother   . Heart Problems Mother   . Colon cancer Maternal Grandfather   . Breast cancer Paternal Grandmother     Social History   Tobacco Use  . Smoking status: Never Smoker  . Smokeless tobacco: Never Used  Vaping Use  . Vaping Use: Never used  Substance Use Topics  . Alcohol use: Not Currently  . Drug use: Yes    Types:  Marijuana    Allergies:  Allergies  Allergen Reactions  . Ciprofloxacin Rash    Medications Prior to Admission  Medication Sig Dispense Refill Last Dose  . pantoprazole (PROTONIX) 20 MG tablet Take 1 tablet (20 mg total) by mouth daily. 30 tablet 3 10/07/2020 at Unknown time  . Prenatal Vit-Fe Fumarate-FA (PRENATAL VITAMIN PO) Take by mouth.    10/07/2020 at Unknown time  . Blood Pressure Monitor MISC For regular home bp monitoring during pregnancy 1 each 0   . clotrimazole (CLOTRIMAZOLE ANTI-FUNGAL) 1 % cream Apply 1 application topically 2 (two) times daily. (Patient not taking: Reported on 10/08/2020) 30 g 1   . Doxylamine-Pyridoxine 10-10 MG TBEC Take 1 tablet by mouth 2 (two) times daily. Take 1 tablet by mouth in the morning, at noon, and at bedtime. Two tablets at bedtime on day 1 and 2; if symptoms persist, take 1 tablet in morning and 2 tablets at bedtime on day 3; if symptoms persist, may increase to 1 tablet in morning, 1 tablet mid-afternoon, and 2  tablets at bedtime on day 4 (maximum: doxylamine 40 mg/pyridoxine 40 mg (4 tablets) per day). Take on empty stomach with full glass of water. (Patient not taking: Reported on 10/08/2020) 60 tablet 6   . ondansetron (ZOFRAN ODT) 4 MG disintegrating tablet Take 1 tablet (4 mg total) by mouth every 6 (six) hours as needed for nausea. (Patient not taking: Reported on 10/08/2020) 30 tablet 2     Review of Systems  Constitutional: Positive for fatigue.  HENT: Negative.   Eyes: Negative.   Respiratory: Negative.   Cardiovascular: Positive for leg swelling.  Gastrointestinal: Positive for nausea.  Endocrine: Negative.   Genitourinary:       DFM x 2 days  Musculoskeletal: Negative.        Swelling of hands  Skin: Negative.   Allergic/Immunologic: Negative.   Neurological: Negative.   Hematological: Negative.   Psychiatric/Behavioral: Negative.    Physical Exam   Patient Vitals for the past 24 hrs:  BP Temp Temp src Pulse Resp SpO2 Height  Weight  10/08/20 1601 132/88 -- -- (!) 120 -- -- -- --  10/08/20 1546 (!) 143/99 -- -- (!) 123 -- -- -- --  10/08/20 1534 (!) 145/93 -- -- (!) 116 -- -- -- --  10/08/20 1519 134/83 98.6 F (37 C) Oral (!) 116 18 97 % 5\' 1"  (1.549 m) 84.2 kg    Physical Exam Vitals and nursing note reviewed.  Constitutional:      Appearance: Normal appearance. She is normal weight.  Cardiovascular:     Rate and Rhythm: Tachycardia present.     Pulses: Normal pulses.     Heart sounds: Normal heart sounds.  Pulmonary:     Effort: Pulmonary effort is normal.     Breath sounds: Normal breath sounds.  Abdominal:     Palpations: Abdomen is soft.  Musculoskeletal:        General: Normal range of motion.  Skin:    General: Skin is warm and dry.  Neurological:     Mental Status: She is alert and oriented to person, place, and time.  Psychiatric:        Mood and Affect: Mood normal.        Behavior: Behavior normal.        Thought Content: Thought content normal.        Judgment: Judgment normal.    REACTIVE NST - FHR: 140 bpm / moderate variability / accels present / decels absent / TOCO: Occ UI noted MAU Course  Procedures Patient informed that the ultrasound is considered a limited OB ultrasound and is not intended to be a complete ultrasound exam.  Patient also informed that the ultrasound is not being completed with the intent of assessing for fetal or placental anomalies or any pelvic abnormalities.  Explained that the purpose of today's ultrasound is to assess for presentation.  Baby was found to be in a cephalic direct OP presentation. Patient acknowledges the purpose of the exam and the limitations of the study.  MDM CCUA -- from [facility] CBC CMP P/C Ratio Serial BP's   Results for orders placed or performed during the hospital encounter of 10/08/20 (from the past 24 hour(s))  CBC     Status: Abnormal   Collection Time: 10/08/20  3:50 PM  Result Value Ref Range   WBC 12.2 (H) 4.0 -  10.5 K/uL   RBC 4.09 3.87 - 5.11 MIL/uL   Hemoglobin 12.8 12.0 - 15.0 g/dL   HCT 12/08/20 78.2 -  46.0 %   MCV 92.9 80.0 - 100.0 fL   MCH 31.3 26.0 - 34.0 pg   MCHC 33.7 30.0 - 36.0 g/dL   RDW 32.9 92.4 - 26.8 %   Platelets 283 150 - 400 K/uL   nRBC 0.0 0.0 - 0.2 %      Assessment and Plan  Gestational hypertension, third trimester - Admit to L&D - Routine orders - PEC labs pending - Report given and care assumed by Sharen Counter, CNM  Raelyn Mora, CNM 10/08/2020, 3:42 PM

## 2020-10-09 ENCOUNTER — Encounter (HOSPITAL_COMMUNITY): Payer: Self-pay | Admitting: Family Medicine

## 2020-10-09 ENCOUNTER — Inpatient Hospital Stay (HOSPITAL_COMMUNITY): Payer: Medicaid Other | Admitting: Anesthesiology

## 2020-10-09 DIAGNOSIS — Z3A38 38 weeks gestation of pregnancy: Secondary | ICD-10-CM

## 2020-10-09 DIAGNOSIS — O133 Gestational [pregnancy-induced] hypertension without significant proteinuria, third trimester: Secondary | ICD-10-CM

## 2020-10-09 LAB — CBC
HCT: 36.8 % (ref 36.0–46.0)
Hemoglobin: 12.2 g/dL (ref 12.0–15.0)
MCH: 31.4 pg (ref 26.0–34.0)
MCHC: 33.2 g/dL (ref 30.0–36.0)
MCV: 94.8 fL (ref 80.0–100.0)
Platelets: 260 10*3/uL (ref 150–400)
RBC: 3.88 MIL/uL (ref 3.87–5.11)
RDW: 12.7 % (ref 11.5–15.5)
WBC: 20 10*3/uL — ABNORMAL HIGH (ref 4.0–10.5)
nRBC: 0 % (ref 0.0–0.2)

## 2020-10-09 LAB — RPR: RPR Ser Ql: NONREACTIVE

## 2020-10-09 MED ORDER — PHENYLEPHRINE 40 MCG/ML (10ML) SYRINGE FOR IV PUSH (FOR BLOOD PRESSURE SUPPORT): 80.0000 ug | PREFILLED_SYRINGE | INTRAVENOUS | Status: DC | PRN

## 2020-10-09 MED ORDER — FENTANYL-BUPIVACAINE-NACL 0.5-0.125-0.9 MG/250ML-% EP SOLN
12.0000 mL/h | EPIDURAL | Status: DC | PRN
  Administered 2020-10-09: 12 mL/h via EPIDURAL
  Filled 2020-10-09: qty 250

## 2020-10-09 MED ORDER — PHENYLEPHRINE 40 MCG/ML (10ML) SYRINGE FOR IV PUSH (FOR BLOOD PRESSURE SUPPORT)
80.0000 ug | PREFILLED_SYRINGE | INTRAVENOUS | Status: DC | PRN
  Filled 2020-10-09: qty 10

## 2020-10-09 MED ORDER — EPHEDRINE 5 MG/ML INJ: 10.0000 mg | INTRAVENOUS | Status: DC | PRN

## 2020-10-09 MED ORDER — OXYTOCIN-SODIUM CHLORIDE 30-0.9 UT/500ML-% IV SOLN
1.0000 m[IU]/min | INTRAVENOUS | Status: DC
  Administered 2020-10-09: 2 m[IU]/min via INTRAVENOUS
  Filled 2020-10-09 (×2): qty 500

## 2020-10-09 MED ORDER — LACTATED RINGERS IV SOLN
500.0000 mL | Freq: Once | INTRAVENOUS | Status: AC
  Administered 2020-10-09: 500 mL via INTRAVENOUS

## 2020-10-09 MED ORDER — DIPHENHYDRAMINE HCL 50 MG/ML IJ SOLN: 12.5000 mg | INTRAMUSCULAR | Status: DC | PRN

## 2020-10-09 MED ORDER — TERBUTALINE SULFATE 1 MG/ML IJ SOLN: 0.2500 mg | Freq: Once | INTRAMUSCULAR | Status: DC | PRN

## 2020-10-09 MED ORDER — LIDOCAINE HCL (PF) 1 % IJ SOLN
INTRAMUSCULAR | Status: DC | PRN
  Administered 2020-10-09 (×2): 5 mL via EPIDURAL

## 2020-10-09 NOTE — Anesthesia Procedure Notes (Signed)
Epidural Patient location during procedure: OB Start time: 10/09/2020 7:45 AM End time: 10/09/2020 7:55 AM  Staffing Anesthesiologist: Mellody Dance, MD Performed: anesthesiologist   Preanesthetic Checklist Completed: patient identified, IV checked, site marked, risks and benefits discussed, monitors and equipment checked, pre-op evaluation and timeout performed  Epidural Patient position: sitting Prep: DuraPrep Patient monitoring: heart rate, cardiac monitor, continuous pulse ox and blood pressure Approach: midline Location: L2-L3 Injection technique: LOR saline  Needle:  Needle type: Tuohy  Needle gauge: 17 G Needle length: 9 cm Needle insertion depth: 6 cm Catheter type: closed end flexible Catheter size: 20 Guage Catheter at skin depth: 11 cm Test dose: negative and Other  Assessment Events: blood not aspirated, injection not painful, no injection resistance and negative IV test  Additional Notes Informed consent obtained prior to proceeding including risk of failure, 1% risk of PDPH, risk of minor discomfort and bruising.  Discussed rare but serious complications including epidural abscess, permanent nerve injury, epidural hematoma.  Discussed alternatives to epidural analgesia and patient desires to proceed.  Timeout performed pre-procedure verifying patient name, procedure, and platelet count.  Patient tolerated procedure well.

## 2020-10-09 NOTE — Discharge Summary (Signed)
   Postpartum Discharge Summary  Date of Service updated 10/11/20     Patient Name: Sarah Rollins DOB: 10/31/2000 MRN: [id number]  Date of admission: 10/08/2020 Delivery date:10/09/2020  Delivering provider: PAYNE, SHANTONETTE M  Date of discharge: 10/11/2020  Admitting diagnosis: Gestational hypertension without significant proteinuria [O13.9] Intrauterine pregnancy: [redacted]w[redacted]d     Secondary diagnosis:  Principal Problem:   Postpartum care following vaginal delivery 5/5 Active Problems:   Supervision of normal first pregnancy   Marijuana use   Chlamydia   Atypical squamous cell changes of undetermined significance (ASCUS) on cervical cytology with negative high risk human papilloma virus (HPV) test result   Gestational hypertension without significant proteinuria   Vaginal delivery   IUD (intrauterine device) in place  Additional problems: none    Discharge diagnosis: Term Pregnancy Delivered                                              Post partum procedures:IUD Augmentation: AROM, Pitocin and Cytotec Complications: None  Hospital course: Induction of Labor With Vaginal Delivery   20 y.o. yo G1P0000 at [redacted]w[redacted]d was admitted to the hospital 10/08/2020 for induction of labor.  Indication for induction: Gestational hypertension.  Patient had an uncomplicated labor course as follows: Membrane Rupture Time/Date: 12:42 PM ,10/09/2020   Delivery Method:Vaginal, Spontaneous  Episiotomy: None  Lacerations:  1st degree;Labial  Details of delivery can be found in separate delivery note.  Patient had a routine postpartum course. Patient is discharged home 10/11/20.  Newborn Data: Birth date:10/09/2020  Birth time:9:07 PM  Gender:Female  Living status:Living  Apgars:9 ,9  Weight:3033 g   Magnesium Sulfate received: No BMZ received: No Rhophylac:N/A MMR:N/A T-DaP:Given prenatally Flu: Yes Transfusion:No  Physical exam  Vitals:   10/10/20 1407 10/10/20 1704 10/10/20 2047 10/11/20 0612  BP:  105/67 (!) 123/93 115/80 105/68  Pulse: 60 (!) 52 (!) 51 (!) 58  Resp: 16  17 17  Temp: 98 F (36.7 C)  97.7 F (36.5 C) 98.7 F (37.1 C)  TempSrc: Oral  Oral Oral  SpO2: 99%  99% 100%  Weight:      Height:       General: alert, cooperative and no distress Lochia: appropriate Uterine Fundus: firm Incision: N/A DVT Evaluation: No evidence of DVT seen on physical exam. Labs: Lab Results  Component Value Date   WBC 20.0 (H) 10/09/2020   HGB 12.2 10/09/2020   HCT 36.8 10/09/2020   MCV 94.8 10/09/2020   PLT 260 10/09/2020   CMP Latest Ref Rng & Units 10/08/2020  Glucose 70 - 99 mg/dL 93  BUN 6 - 20 mg/dL 20  Creatinine 0.44 - 1.00 mg/dL 0.77  Sodium 135 - 145 mmol/L 133(L)  Potassium 3.5 - 5.1 mmol/L 4.5  Chloride 98 - 111 mmol/L 100  CO2 22 - 32 mmol/L 25  Calcium 8.9 - 10.3 mg/dL 9.1  Total Protein 6.5 - 8.1 g/dL 6.7  Total Bilirubin 0.3 - 1.2 mg/dL 0.6  Alkaline Phos 38 - 126 U/L 150(H)  AST 15 - 41 U/L 16  ALT 0 - 44 U/L 9   Edinburgh Score: Edinburgh Postnatal Depression Scale Screening Tool 10/10/2020  I have been able to laugh and see the funny side of things. (No Data)     After visit meds:  Allergies as of 10/11/2020      Reactions     Ciprofloxacin Rash      Medication List    STOP taking these medications   clotrimazole 1 % cream Commonly known as: Clotrimazole Anti-Fungal   Doxylamine-Pyridoxine 10-10 MG Tbec   ondansetron 4 MG disintegrating tablet Commonly known as: Zofran ODT   pantoprazole 20 MG tablet Commonly known as: Protonix     TAKE these medications   acetaminophen 500 MG tablet Commonly known as: TYLENOL Take 2 tablets (1,000 mg total) by mouth every 6 (six) hours as needed (for pain scale < 4).   Blood Pressure Monitor Misc For regular home bp monitoring during pregnancy   coconut oil Oil Apply 1 application topically as needed.   ibuprofen 600 MG tablet Commonly known as: ADVIL Take 1 tablet (600 mg total) by mouth every 6  (six) hours as needed.   PRENATAL VITAMIN PO Take by mouth.        Discharge home in stable condition Infant Feeding: Breast Infant Disposition:rooming in Discharge instruction: per After Visit Summary and Postpartum booklet. Activity: Advance as tolerated. Pelvic rest for 6 weeks.  Diet: routine diet Future Appointments: Future Appointments  Date Time Provider [place]  10/15/2020  3:10 PM Myrtis Ser, CNM [facility]   Follow up Visit:   Please schedule this patient for a In person postpartum visit in 4 weeks with the following provider: Any provider. Additional Postpartum F/U:BP check 1 week  Low risk pregnancy complicated by: HTN Delivery mode:  Vaginal, Spontaneous  Anticipated Birth Control:  PP IUD placed   06/12/9676 Arrie Senate, MD

## 2020-10-09 NOTE — Lactation Note (Signed)
This note was copied from a baby's chart. Lactation Consultation Note  Patient Name: Sarah Rollins [id number] Date: 10/09/2020 Reason for consult: L&D Initial assessment;Primapara;1st time breastfeeding;Early term 37-38.6wks Age:22 hours  L&D consult with 23 minutes old infant and P1 mother. Parents and grandmother present at time of consult. Congratulated them on their newborn. Infant is skin to skin prone on mother's chest. Discussed STS as ideal transition for infants after birth helping with temperature, blood sugar and comfort. Talked about primal reflexes such as rooting, hands to mouth, searching for the breast among others.  Demonstrated HE and placed infant close to nipple. Infant is uninterested, no cues elicited. Mother states she has been pumping and has ~2oz of EBM at home.  Explained LC services availability during postpartum stay. Thanked family for their time.    Maternal Data Has patient been taught Hand Expression?: Yes Does the patient have breastfeeding experience prior to this delivery?: No  Feeding Mother's Current Feeding Choice: Breast Milk  LATCH Score Latch: Too sleepy or reluctant, no latch achieved, no sucking elicited.  Audible Swallowing: None  Type of Nipple: Everted at rest and after stimulation (short shafted)  Comfort (Breast/Nipple): Soft / non-tender  Hold (Positioning): Assistance needed to correctly position infant at breast and maintain latch.  LATCH Score: 5  Interventions Interventions: Breast feeding basics reviewed;Assisted with latch;Skin to skin;Breast massage;Hand express;Expressed milk;Education  Discharge Pump: Personal  Consult Status Consult Status: Follow-up Date: 10/09/20 Follow-up type: In-patient    Lyndall Bellot A Higuera Ancidey 10/09/2020, 9:51 PM

## 2020-10-09 NOTE — Progress Notes (Signed)
Patient ID: Sarah Rollins, female   DOB: Oct 24, 2000, 20 y.o.   MRN: [id number]  Just had epidural placed  BPs 119/77, 133/90 FHR 130s, +accels, no decels Ctx q2-3 mins with Pit at 60mu/min Cx deferred (last exam 3+/70/vtx -1 @ Pit start 0230)  IUP@38 .1wks gHTN IOL process  After comfortable, plan cervical check to assess for progress   [name] CNM 10/09/2020

## 2020-10-09 NOTE — Progress Notes (Signed)
Patient ID: Sarah Rollins, female   DOB: 20-Apr-2001, 20 y.o.   MRN: [id number]  Resting in bed; Pit started ~ 0200; not feeling uncomfortable yet  BPs 123/66, 113/61, P 67 FHR 120-130s, +accels, no decels Ctx irreg, mild with Pit at 76mu/min Cx at start of Pit: 3-4/70/vtx -1  IUP@38 .1wks gHTN Cx favorable  Continue to titrate Pit to achieve active labor Anticipate vag del  [name] [facility] 10/09/2020 4:49 AM

## 2020-10-09 NOTE — Anesthesia Preprocedure Evaluation (Signed)
Anesthesia Evaluation  Patient identified by MRN, date of birth, ID band Patient awake    Reviewed: Allergy & Precautions, NPO status , Patient's Chart, lab work & pertinent test results  Airway Mallampati: II  TM Distance: >3 FB Neck ROM: Full    Dental no notable dental hx.    Pulmonary neg pulmonary ROS,    Pulmonary exam normal breath sounds clear to auscultation       Cardiovascular Exercise Tolerance: Good hypertension, Normal cardiovascular exam Rhythm:Regular Rate:Normal     Neuro/Psych PSYCHIATRIC DISORDERS negative neurological ROS     GI/Hepatic negative GI ROS, (+)     substance abuse  marijuana use,   Endo/Other  negative endocrine ROS  Renal/GU negative Renal ROS  negative genitourinary   Musculoskeletal negative musculoskeletal ROS (+)   Abdominal   Peds negative pediatric ROS (+)  Hematology negative hematology ROS (+)   Anesthesia Other Findings   Reproductive/Obstetrics (+) Pregnancy                             Anesthesia Physical Anesthesia Plan  ASA: III  Anesthesia Plan: Epidural   Post-op Pain Management:    Induction:   PONV Risk Score and Plan: 2 and Treatment may vary due to age or medical condition  Airway Management Planned: Natural Airway  Additional Equipment:   Intra-op Plan:   Post-operative Plan:   Informed Consent: I have reviewed the patients History and Physical, chart, labs and discussed the procedure including the risks, benefits and alternatives for the proposed anesthesia with the patient or authorized representative who has indicated his/her understanding and acceptance.       Plan Discussed with: Anesthesiologist  Anesthesia Plan Comments:         Anesthesia Quick Evaluation

## 2020-10-10 DIAGNOSIS — Z30430 Encounter for insertion of intrauterine contraceptive device: Secondary | ICD-10-CM

## 2020-10-10 DIAGNOSIS — O139 Gestational [pregnancy-induced] hypertension without significant proteinuria, unspecified trimester: Secondary | ICD-10-CM

## 2020-10-10 MED ORDER — MEASLES, MUMPS & RUBELLA VAC IJ SOLR: 0.5000 mL | Freq: Once | INTRAMUSCULAR | Status: DC

## 2020-10-10 MED ORDER — ACETAMINOPHEN 325 MG PO TABS
650.0000 mg | ORAL_TABLET | ORAL | Status: DC | PRN
  Administered 2020-10-10 – 2020-10-11 (×3): 650 mg via ORAL
  Filled 2020-10-10 (×3): qty 2

## 2020-10-10 MED ORDER — SIMETHICONE 80 MG PO CHEW: 80.0000 mg | CHEWABLE_TABLET | ORAL | Status: DC | PRN

## 2020-10-10 MED ORDER — ZOLPIDEM TARTRATE 5 MG PO TABS: 5.0000 mg | ORAL_TABLET | Freq: Every evening | ORAL | Status: DC | PRN

## 2020-10-10 MED ORDER — IBUPROFEN 600 MG PO TABS
600.0000 mg | ORAL_TABLET | Freq: Four times a day (QID) | ORAL | Status: DC
  Administered 2020-10-10 – 2020-10-11 (×6): 600 mg via ORAL
  Filled 2020-10-10 (×6): qty 1

## 2020-10-10 MED ORDER — SENNOSIDES-DOCUSATE SODIUM 8.6-50 MG PO TABS
2.0000 | ORAL_TABLET | Freq: Every day | ORAL | Status: DC
  Administered 2020-10-10 – 2020-10-11 (×2): 2 via ORAL
  Filled 2020-10-10 (×3): qty 2

## 2020-10-10 MED ORDER — BENZOCAINE-MENTHOL 20-0.5 % EX AERO
1.0000 "application " | INHALATION_SPRAY | CUTANEOUS | Status: DC | PRN
  Administered 2020-10-11: 1 via TOPICAL
  Filled 2020-10-10 (×2): qty 56

## 2020-10-10 MED ORDER — PRENATAL MULTIVITAMIN CH
1.0000 | ORAL_TABLET | Freq: Every day | ORAL | Status: DC
  Administered 2020-10-11: 1 via ORAL
  Filled 2020-10-10 (×2): qty 1

## 2020-10-10 MED ORDER — DIPHENHYDRAMINE HCL 25 MG PO CAPS
25.0000 mg | ORAL_CAPSULE | Freq: Four times a day (QID) | ORAL | Status: DC | PRN
Start: 2020-10-10 — End: 2020-10-11

## 2020-10-10 MED ORDER — FERROUS SULFATE 325 (65 FE) MG PO TABS
325.0000 mg | ORAL_TABLET | ORAL | Status: DC
  Filled 2020-10-10: qty 1

## 2020-10-10 MED ORDER — WITCH HAZEL-GLYCERIN EX PADS
1.0000 | MEDICATED_PAD | CUTANEOUS | Status: DC | PRN
Start: 2020-10-09 — End: 2020-10-11
  Administered 2020-10-11: 1 via TOPICAL

## 2020-10-10 MED ORDER — DIBUCAINE (PERIANAL) 1 % EX OINT: 1.0000 "application " | TOPICAL_OINTMENT | CUTANEOUS | Status: DC | PRN

## 2020-10-10 MED ORDER — ONDANSETRON HCL 4 MG PO TABS
4.0000 mg | ORAL_TABLET | ORAL | Status: DC | PRN
  Administered 2020-10-10 (×3): 4 mg via ORAL
  Filled 2020-10-10 (×3): qty 1

## 2020-10-10 MED ORDER — MEDROXYPROGESTERONE ACETATE 150 MG/ML IM SUSP: 150.0000 mg | INTRAMUSCULAR | Status: DC | PRN

## 2020-10-10 MED ORDER — TETANUS-DIPHTH-ACELL PERTUSSIS 5-2.5-18.5 LF-MCG/0.5 IM SUSY: 0.5000 mL | PREFILLED_SYRINGE | Freq: Once | INTRAMUSCULAR | Status: DC

## 2020-10-10 MED ORDER — COCONUT OIL OIL: 1.0000 "application " | TOPICAL_OIL | Status: DC | PRN

## 2020-10-10 MED ORDER — ONDANSETRON HCL 4 MG/2ML IJ SOLN: 4.0000 mg | INTRAMUSCULAR | Status: DC | PRN

## 2020-10-10 NOTE — Clinical Social Work Maternal (Addendum)
CLINICAL SOCIAL WORK MATERNAL/CHILD NOTE  Patient Details  Name: Sarah Rollins MRN: [id number] Date of Birth: Apr 12, 2001  Date:  10/10/2020  Clinical Social Worker Initiating Note:  Sarah Rollins, MSW, LCSWA Date/Time: Initiated:  10/10/20/1134     Child's Name:  Sarah Rollins   Biological Parents:  Mother,Father Sarah Rollins, 04/04/86, [phone])   Need for Interpreter:  None   Reason for Referral:  [place] Substance Use/Substance Use During Pregnancy    Address:  [place] [address] (MOB's mother address)  MOB's Address: [address] [place], [place] [address]    Phone number:  [phone]    Additional phone number: [phone]  Household Members/Support Persons (HM/SP):   Household Member/Support Person 1   HM/SP Name Relationship DOB or Age  HM/SP -1 Sarah Rollins FOB 04/04/86  HM/SP -2        HM/SP -3        HM/SP -4        HM/SP -5        HM/SP -6        HM/SP -7        HM/SP -8          Natural Supports (not living in the home):  Spouse/significant other,Immediate Family   Professional Supports:     Employment: Animator   Type of Work: Music therapist, [place]   Education:  [place] arranged:    Museum/gallery curator Resources:  [employer]    Other Resources:  [facility]   Cultural/Religious Considerations Which May Impact Care:    Strengths:  Understanding of illness,Home prepared for child ,Ability to meet basic needs ,Pediatrician chosen   Psychotropic Medications:         Pediatrician:    [employer]  Pediatrician List:   [facility] ([place])  [facility]      Pediatrician Fax Number:    Risk Factors/Current Problems:  Substance Use ,Mental Health Concerns    Cognitive State:  Able to Concentrate ,Alert ,Insightful ,Linear Thinking    Mood/Affect:   Comfortable ,Interested ,Relaxed ,Bright ,Calm ,Happy    CSW Assessment: CSW met with MOB to complete assessment for hx of anxiety, depression, and THC use during pregnancy. CSW observed MOB resting in bed, while FOB was holding infant. MOB gave CSW verbal consent to complete assessment while FOB was present. CSW explained role and reason for consult. MOB was pleasant, polite and engaged with CSW. MOB reported, experiencing both anxiety and depression back in 2016. MOB reported, her family has a hx of both diagnoses. MOB reported, she has been able to manage symptoms with no medication.   CSW informed MOB of infant's positive UDS for THC. MOB reported, he last use of THC was approximately a month ago. MOB reported, her reason for [facility] use was, because she was not able to eat during pregnancy. MOB denied any substance or CPS involvement.   CSW informed MOB of Drug Screen Policy and MOB was understanding of protocol. CSW made a report to [place] via [place]. CSW will make CPS aware of CDS results.    CSW provided education regarding the baby blues period vs. perinatal mood disorders, discussed treatment and gave resources for mental health follow up if concerns arise. CSW recommends self- evaluation during the postpartum time period using the New Mom  Checklist from Postpartum Progress and encouraged MOB to contact a medical professional if symptoms are noted at any time.   When CSW asked MOB about her emotions since delivery. MOB reported, she is feeling "anxious". MOB identified, FOB as her support. MOB denied SI, and HI when CSW assessed for safety.   MOB reported, she receive WIC. CSW informed MOB of options to complete FS application. MOB reported, infant's pediatrician will be at [place] and there are no barriers to follow up care. MOB reported, she has all essentials needed to care for infant. MOB reported, infant has a new  car seat, crib, and bassinet. MOB denied any additional barriers.     CSW provided education on Sudden Infant Death Syndrome (SIDS).    CSW will continue to follow the CDS and will make CPS aware of CDS results.     CSW Plan/Description:  Sudden Infant Death Syndrome (SIDS) Education,Perinatal Mood and Anxiety Disorder (PMADs) Education,Hospital Drug Screen Policy Information,CSW Will Continue to Monitor Umbilical Cord Tissue Drug Screen Results and Make Report if Wilhemena Durie 10/10/2020, 11:43 AM

## 2020-10-10 NOTE — Progress Notes (Signed)
Meyer Cory notified of BP of 123/93 and updated on patient's status from today. No further orders. We are to just watch and monitor patient for now. Patient states she is now nauseated again however she refused Zofran at this time. Patient told to tell RN if she starts to experience worsening symptoms related to "seeing spots". Patient getting ready to eat.

## 2020-10-10 NOTE — Progress Notes (Signed)
Pt vomiting and states that she has had intermittent nausea and vomiting throughout her pregnancy.  Green bile noted.  Pt pain level higher due to vomiting.Medication given.  Ambulated to bathroom. Back to bed without incident

## 2020-10-10 NOTE — Procedures (Signed)
  Post-Placental IUD Insertion Procedure Note  Patient identified, informed consent signed prior to delivery, signed copy in chart, time out was performed.    Vaginal, labial and perineal areas thoroughly inspected for lacerations. 0 degree laceration identified - Liletta  - - IUD inserted with inserter per manufacturer's instructions.    Strings trimmed to the level of the introitus. Patient tolerated procedure well.  Patient given post procedure instructions and IUD care card with expiration date.  Patient is asked to keep IUD strings tucked in her vagina until her postpartum follow up visit in 4-6 weeks. Patient advised to abstain from sexual intercourse and pulling on strings before her follow-up visit. Patient verbalized an understanding of the plan of care and agrees.   

## 2020-10-10 NOTE — Progress Notes (Signed)
Patient states her nausea has gone away but still states she sees spots occasionally not like earlier today. She states she got up to the bathroom without any dizzyness or spots all. No epigastric pain noted.

## 2020-10-10 NOTE — Plan of Care (Signed)
Pt transferred to unit accompanied by partner and infant.  Oriented to room.  Safe baby instructions given.  Admission package explained.  Pt resting with partner at bedside for support

## 2020-10-10 NOTE — Lactation Note (Addendum)
This note was copied from a baby's chart. Lactation Consultation Note  Patient Name: Boy Haelyn Forgey [id number] Date: 10/10/2020 Reason for consult: Mother's request Age:22 hours  LC in to room per mother's request. Infant is latched to left breast cradle position. LC observed ideal alignment and good neck/back support. Infant holds nipple in mouth but not suckling noted. Mother states infant has been breastfeeding for ~45 minutes. Infant has latched to both breasts. Per mother, it feels similar to tugging from DEBP. Mother reports audible swallows.  Mother has ~106mL of EBM. Discussed using EBM to top up infant after feedings. LC poured ~10 mL of EBM into Dr. Theora Gianotti bottle (brought from home). Mother attempts bottlefeeding but infant seems uninterested.  Talked about clusterfeeding as infant reaches 24HOL. Encouraged mother to pump as needed and reviewed pump storage.   Feeding Mother's Current Feeding Choice: Breast Milk  LATCH Score Latch: Too sleepy or reluctant, no latch achieved, no sucking elicited.  Audible Swallowing: None  Type of Nipple: Everted at rest and after stimulation  Comfort (Breast/Nipple): Soft / non-tender  Hold (Positioning): Assistance needed to correctly position infant at breast and maintain latch.  LATCH Score: 5   Lactation Tools Discussed/Used Tools: Pump;Flanges Flange Size: 24 Breast pump type: Double-Electric Breast Pump;Manual Pump Education: Setup, frequency, and cleaning;Milk Storage Reason for Pumping: stimulation and supplementation Pumping frequency: Q3 Pumped volume: 10 mL (still pumping)  Interventions Interventions: Education;DEBP;Expressed milk;Breast feeding basics reviewed  Discharge Pump: Personal  Consult Status Consult Status: Follow-up Date: 10/11/20 Follow-up type: In-patient    Joal Eakle A Higuera Ancidey 10/10/2020, 8:26 PM

## 2020-10-10 NOTE — Progress Notes (Signed)
Patient stated she was seeing spots. Patients BP at 1407 was 105/67. Patient told to not get without help if she still is seeing spots. Patients fundus even firm no clots. I told patient to make sure she rests and eats. Patient informed to call me if she needs help getting up. Right now she is lying down with infant. Sats 99 percent. Patients nausea has seemed to subside.

## 2020-10-10 NOTE — Progress Notes (Addendum)
PPD # 1 S/P SVD  Live born female  Birth Weight: 6 lb 11 oz (3033 g) APGAR: 9, 9  Newborn Delivery   Birth date/time: 10/09/2022 21:07:00 Delivery type: Vaginal, Spontaneous     Baby name: Sarah Rollins Post Delivering provider: Deloris Ping  Episiotomy:None   Lacerations:1st degree;Labial   circumcision desired. Plan for after peds clearance.   Feeding: breast  Pain control at delivery: Epidural   S:  Reports feeling well. Feeling tired this morning but well overall.              Tolerating po/ No nausea or vomiting             Bleeding is minimal without clots              Pain controlled with  PO Ibuprofen.              Up ad lib / ambulatory / voiding without difficulties   O:  A & O x 3, in no apparent distress              VS:  Vitals:   10/09/20 2241 10/09/20 2245 10/09/20 2301 10/09/20 2325  BP: 122/79 123/85 (!) 130/92 133/90  Pulse: 67 66 93 90  Resp: 16   18  Temp:    98.7 F (37.1 C)  TempSrc:    Oral  SpO2:    99%  Weight:      Height:        LABS:  Recent Labs    10/08/20 1550 10/09/20 2159  WBC 12.2* 20.0*  HGB 12.8 12.2  HCT 38.0 36.8  PLT 283 260    Blood type: --/--/O POS (05/04 1625)  Rubella: 2.50 (11/10 1021)   Vaccines: TDaP          UTD                    COVID-19 x1.   Gen: AAO x 3, NAD  Abdomen: soft, non-tender, non-distended             Fundus: firm, tender to palpation below umbilicus  Perineum: intact with minimal swelling.  Lochia: rubra, minimal without clots.  Extremities: mild non-pitting edema, no calf pain or tenderness    A/P: PPD # 1 20 y.o., G1P1001   Principal Problem:   Postpartum care following vaginal delivery 5/5  -Continue with routine PP orders. Encouraged ambulation for gut motility. Recommended PO ibuprofen and tylenol for pain management PRN. Discussed ice pads throughout the day for perineal swelling. Encouraged rest when baby rest. Active Problems:   Marijuana use   Gestational hypertension without  significant proteinuria  -Bps stable PP, continue to monitor closely.   Anticipate discharge tomorrow pending stable Bps throughout the day.    Shantonette Isaias Sakai) Rollene Rotunda, BSN, RNC-OB  Student Nurse-Midwife   10/10/2020  8:07 AM   I personally saw and evaluated the patient, performing the key elements of the service. I developed and verified the management plan that is described in the resident's/student's note, and I agree with the content with my edits above. VSS, HRR&R, Resp unlabored, Legs neg.  Nigel Berthold, CNM 10/10/2020 8:20 AM

## 2020-10-10 NOTE — Lactation Note (Addendum)
This note was copied from a baby's chart. Lactation Consultation Note  Patient Name: Sarah Rollins [id number] Date: 10/10/2020 Reason for consult: Initial assessment;Primapara;Early term 37-38.6wks Age:22 hours  Initial visit with 20 hours old infant of P1 mother..Mother states infant has not fed for a while. Mother attempts to latch infant but he pops off breast or cries.  Mother has been pumping for stimulation at home prior to delivery and colostrum is easily expressed. Mother able to collect ~22mL with hand pump.  Assisted mother with football latch to left breast. Provided support with pillows. Mother has a brest-friend pillow. Mother gets infant undressed. Several attempts to latch, infant cries with nipple in mouth. LC assisted with spoon-feeding. Infant spits of EBM, probably swallows 6 mL of ~80mL provided. Infant does not suck at finger. LC swaddled and rocked until he was comfortable and grandmother took him. Demonstrated how to use DEBP to stimulate breasts. Reviewed milk storage and proper care of parts. Mother collects 21ml <34minutes into initiation setting.  Plan: 1-Skin to skin 2-Aim for a deep, comfortable latch 3-Breastfeeding on demand or 8-12 times in 24h period. 4-Keep infant awake during breastfeeding session: massaging breast, infant's hand/shoulder/feet 5-Pump using initiation setting 6-Spoon or fingerfeed EBM  7-Monitor voids and stools as signs good intake.  8-Encouraged maternal rest, hydration and food intake.  9-Contact LC as needed for feeds/support/concerns/questions All questions answered at this time. Provided LC services brochure and promoted [employer] booklet.    Feeding Mother's Current Feeding Choice: Breast Milk  LATCH Score Latch: Too sleepy or reluctant, no latch achieved, no sucking elicited.  Audible Swallowing: None  Type of Nipple: Everted at rest and after stimulation  Comfort (Breast/Nipple): Soft / non-tender  Hold (Positioning): Assistance  needed to correctly position infant at breast and maintain latch.  LATCH Score: 5   Lactation Tools Discussed/Used Tools: Pump;Flanges Flange Size: 24 Breast pump type: Double-Electric Breast Pump;Manual Pump Education: Setup, frequency, and cleaning;Milk Storage Reason for Pumping: stimulation and supplementation Pumping frequency: Q3 Pumped volume: 10 mL (still pumping)  Interventions Interventions: Breast feeding basics reviewed;Assisted with latch;Skin to skin;Breast massage;Hand express;Adjust position;Support pillows;Position options;Expressed milk;Hand pump;DEBP;Education  Discharge Pump: Personal  Consult Status Consult Status: Follow-up Date: 10/10/20 Follow-up type: In-patient    Loralie Malta A Higuera Ancidey 10/10/2020, 5:07 PM

## 2020-10-10 NOTE — Anesthesia Postprocedure Evaluation (Signed)
Anesthesia Post Note  Patient: Jaylaa Gallion  Procedure(s) Performed: AN AD HOC LABOR EPIDURAL     Patient location during evaluation: Mother Baby Anesthesia Type: Epidural Level of consciousness: awake and alert Pain management: pain level controlled Vital Signs Assessment: post-procedure vital signs reviewed and stable Respiratory status: spontaneous breathing, nonlabored ventilation and respiratory function stable Cardiovascular status: stable Postop Assessment: no headache, no backache and epidural receding Anesthetic complications: no   No complications documented.  Last Vitals:  Vitals:   10/10/20 0400 10/10/20 0700  BP: 138/89 132/90  Pulse: 88 66  Resp: 16 18  Temp: 36.9 C 37.1 C  SpO2:  100%    Last Pain:  Vitals:   10/10/20 0800  TempSrc:   PainSc: 2    Pain Goal:                   Ethaniel Garfield

## 2020-10-11 DIAGNOSIS — Z975 Presence of (intrauterine) contraceptive device: Secondary | ICD-10-CM

## 2020-10-11 MED ORDER — COCONUT OIL OIL: 1.0000 "application " | TOPICAL_OIL | 0 refills | Status: DC | PRN

## 2020-10-11 MED ORDER — IBUPROFEN 600 MG PO TABS: 600.0000 mg | ORAL_TABLET | Freq: Four times a day (QID) | ORAL | 0 refills | Status: DC | PRN

## 2020-10-11 MED ORDER — ACETAMINOPHEN 500 MG PO TABS: 1000.0000 mg | ORAL_TABLET | Freq: Four times a day (QID) | ORAL | Status: DC | PRN

## 2020-10-11 NOTE — Lactation Note (Signed)
This note was copied from a baby's chart. Lactation Consultation Note  Patient Name: Sarah Rollins Date: 10/11/2020   Age:22 hours  Long consult. Mom's breasts are soft. When I entered room, infant was at bare breast. Mom commented that infant kept falling asleep at breast. Infant began cueing, an attempt was made at the bare breast, infant unable to "find" nipple. Nipple shield initiated (with supplement of DBM subsequently added,) but infant too frantic to latch.   DBM was offered in a bottle with the Nfant Slow flow nipple (in side-lying inclined position), but infant had difficulty with bottle-feeding and the swallows that were heard sounded too loud. I called and spoke Cathi Roan, SLP. She provided an Ultra-Preemie nipple. Infant did much better with the Ultra-Preemie nipple.   Mom was tearful and tired. I discussed pumping with Mom (size 21 flanges provided), but I emphasized that she needed to take a long nap prior to beginning pumping.   Newell Coral, RN was given an update.   Lurline Hare [facility] 10/11/2020, 10:36 AM

## 2020-10-11 NOTE — Discharge Instructions (Signed)
-take tylenol 1000 mg every 6 hours as needed for pain, alternate with ibuprofen 600 mg every 6 hours -drink plenty of water to help with breastfeeding -continue prenatal vitamins while you are breastfeeding -Blood pressure check in 1 week -IUD string check and postpartum visit in 6 weeks   Vaginal Delivery  Vaginal delivery means that you give birth by pushing your baby out of your birth canal (vagina). A team of health care providers will help you before, during, and after vaginal delivery. Birth experiences are unique for every woman and every pregnancy, and birth experiences vary depending on where you choose to give birth. What happens when I arrive at the birth center or hospital? Once you are in labor and have been admitted into the hospital or birth center, your health care provider may:  Review your pregnancy history and any concerns that you have.  Insert an IV into one of your veins. This may be used to give you fluids and medicines.  Check your blood pressure, pulse, temperature, and heart rate (vital signs).  Check whether your bag of water (amniotic sac) has broken (ruptured).  Talk with you about your birth plan and discuss pain control options. Monitoring Your health care provider may monitor your contractions (uterine monitoring) and your baby's heart rate (fetal monitoring). You may need to be monitored:  Often, but not continuously (intermittently).  All the time or for long periods at a time (continuously). Continuous monitoring may be needed if: ? You are taking certain medicines, such as medicine to relieve pain or make your contractions stronger. ? You have pregnancy or labor complications. Monitoring may be done by:  Placing a special stethoscope or a handheld monitoring device on your abdomen to check your baby's heartbeat and to check for contractions.  Placing monitors on your abdomen (external monitors) to record your baby's heartbeat and the frequency  and length of contractions.  Placing monitors inside your uterus through your vagina (internal monitors) to record your baby's heartbeat and the frequency, length, and strength of your contractions. Depending on the type of monitor, it may remain in your uterus or on your baby's head until birth.  Telemetry. This is a type of continuous monitoring that can be done with external or internal monitors. Instead of having to stay in bed, you are able to move around during telemetry. Physical exam Your health care provider may perform frequent physical exams. This may include:  Checking how and where your baby is positioned in your uterus.  Checking your cervix to determine: ? Whether it is thinning out (effacing). ? Whether it is opening up (dilating). What happens during labor and delivery? Normal labor and delivery is divided into the following three stages: Stage 1  This is the longest stage of labor.  This stage can last for hours or days.  Throughout this stage, you will feel contractions. Contractions generally feel mild, infrequent, and irregular at first. They get stronger, more frequent (about every 2-3 minutes), and more regular as you move through this stage.  This stage ends when your cervix is completely dilated to 4 inches (10 cm) and completely effaced. Stage 2  This stage starts once your cervix is completely effaced and dilated and lasts until the delivery of your baby.  This stage may last from 20 minutes to 2 hours.  This is the stage where you will feel an urge to push your baby out of your vagina.  You may feel stretching and burning pain, especially  when the widest part of your baby's head passes through the vaginal opening (crowning).  Once your baby is delivered, the umbilical cord will be clamped and cut. This usually occurs after waiting a period of 1-2 minutes after delivery.  Your baby will be placed on your bare chest (skin-to-skin contact) in an upright  position and covered with a warm blanket. Watch your baby for feeding cues, like rooting or sucking, and help the baby to your breast for his or her first feeding. Stage 3  This stage starts immediately after the birth of your baby and ends after you deliver the placenta.  This stage may take anywhere from 5 to 30 minutes.  After your baby has been delivered, you will feel contractions as your body expels the placenta and your uterus contracts to control bleeding.   What can I expect after labor and delivery?  After labor is over, you and your baby will be monitored closely until you are ready to go home to ensure that you are both healthy. Your health care team will teach you how to care for yourself and your baby.  You and your baby will stay in the same room (rooming in) during your hospital stay. This will encourage early bonding and successful breastfeeding.  You may continue to receive fluids and medicines through an IV.  Your uterus will be checked and massaged regularly (fundal massage).  You will have some soreness and pain in your abdomen, vagina, and the area of skin between your vaginal opening and your anus (perineum).  If an incision was made near your vagina (episiotomy) or if you had some vaginal tearing during delivery, cold compresses may be placed on your episiotomy or your tear. This helps to reduce pain and swelling.  You may be given a squirt bottle to use instead of wiping when you go to the bathroom. To use the squirt bottle, follow these steps: ? Before you urinate, fill the squirt bottle with warm water. Do not use hot water. ? After you urinate, while you are sitting on the toilet, use the squirt bottle to rinse the area around your urethra and vaginal opening. This rinses away any urine and blood. ? Fill the squirt bottle with clean water every time you use the bathroom.  It is normal to have vaginal bleeding after delivery. Wear a sanitary pad for vaginal  bleeding and discharge. Summary  Vaginal delivery means that you will give birth by pushing your baby out of your birth canal (vagina).  Your health care provider may monitor your contractions (uterine monitoring) and your baby's heart rate (fetal monitoring).  Your health care provider may perform a physical exam.  Normal labor and delivery is divided into three stages.  After labor is over, you and your baby will be monitored closely until you are ready to go home. This information is not intended to replace advice given to you by your health care provider. Make sure you discuss any questions you have with your health care provider. Document Revised: 06/28/2017 Document Reviewed: 06/28/2017 Elsevier Patient Education  2021 [employer].

## 2020-10-12 ENCOUNTER — Ambulatory Visit: Payer: Self-pay

## 2020-10-12 NOTE — Lactation Note (Signed)
This note was copied from a baby's chart. Lactation Consultation Note  Patient Name: Boy Jesselyn Rask [id number] Date: 10/12/2020 Reason for consult: Follow-up assessment;Early term 37-38.6wks;Primapara Age:22 hours  I spoke with Dr. Manson Passey and shared my observation that infant may have some posterior tongue restriction based on what I observed with infant yesterday (cleft in tip of tongue, decreased elevation, etc.).  Dr. Manson Passey was fine with me sharing my observation with parents, which I did. Parents asked for someone they could see for a consultation post-discharge. I shared with them information about Dr. Winfield Rast, pediatric dentist.   RN was bottle-feeding infant while I was in room. Mom has set an alarm to pump q3hrs; she most recently obtained 20 mL+. Mom knows how to switch to maintenance phase of DEBP if she gets 20 mL+ two more times. Mom is comfortable with pumping.  Mom had no additional questions for me.  Lurline Hare [facility] 10/12/2020, 10:59 AM

## 2020-10-12 NOTE — Lactation Note (Signed)
This note was copied from a baby's chart. Lactation Consultation Note Baby 50 hrs old. Not taking in much each feeding. Noted decreased output today. Gave mom information on how much baby needs each feeding. Mom hasn't tried to put the baby to the breast for the last 2 feedings, she has just pumped and bottle fed. Mom is giving Donor milk and her EBM. Mom's milk is starting to come in. Noted transitional milk during pumping. Praised mom. Engorgement management, milk storage, and expected out put of baby discussed. Discussed ways to stimulate baby to take more at each feeding. Baby needs 30-60 ml at this time each feeding if not going to the breast. Mom states understanding.  Patient Name: Sarah Rollins VCBSW'H Date: 10/12/2020 Reason for consult: Follow-up assessment;Primapara;Early term 37-38.6wks Age:70 hours  Maternal Data    Feeding Nipple Type: Dr. Irving Burton level 1  LATCH Score                    Lactation Tools Discussed/Used Tools: Pump Breast pump type: Double-Electric Breast Pump Pump Education: Setup, frequency, and cleaning;Milk Storage Reason for Pumping: pumping and bottle feeding Pumping frequency: Q 3 hrs  Interventions    Discharge    Consult Status Consult Status: Follow-up Date: 10/12/20 Follow-up type: In-patient    Itzae Miralles, Diamond Nickel 10/12/2020, 12:03 AM

## 2020-10-13 ENCOUNTER — Other Ambulatory Visit (HOSPITAL_COMMUNITY): Payer: Self-pay | Admitting: *Deleted

## 2020-10-13 ENCOUNTER — Ambulatory Visit: Payer: Self-pay

## 2020-10-13 NOTE — Lactation Note (Signed)
This note was copied from a baby's chart. Lactation Consultation Note  Patient Name: Sarah Rollins Date: 10/13/2020 Reason for consult: Follow-up assessment Age:22 days   Mother pumping q 3 hours. She just pumped 60 ml. She has a refrigerator with 3 bottles with 18ml in each bottle.  Mother reports that she is latching infant to the breast for a few mins before bottle feeding.  Discussed treatment and prevention of engorgement. Discussed pre-pumping when engorged to soften breast and allow infant to breastfeed  Plan of Care : Breastfeed infant with feeding cues Supplement infant with ebm/according to supplemental guidelines. Pump using a DEBP after each feeding for 15-20 mins.   Mother to continue to cue base feed infant and feed at least 8-12 times or more in 24 hours and advised to allow for cluster feeding infant as needed.  Mother to continue to due STS. Mother is aware of available LC services at [facility], BFSG'S, OP Dept, and phone # for questions or concerns about breastfeeding.  Mother receptive to all teaching and plan of care.    Maternal Data    Feeding Mother's Current Feeding Choice: Breast Milk Nipple Type: Dr. Cline Crock  LATCH Score                    Lactation Tools Discussed/Used    Interventions    Discharge Discharge Education: Engorgement and breast care;Warning signs for feeding baby;Outpatient recommendation Pump: Personal;Manual;DEBP (Mother has Medela pump at home)  Consult Status Consult Status: Complete    Michel Bickers 10/13/2020, 12:59 PM

## 2020-10-15 ENCOUNTER — Encounter: Payer: Medicaid Other | Admitting: Advanced Practice Midwife

## 2020-10-16 ENCOUNTER — Other Ambulatory Visit: Payer: Self-pay

## 2020-10-16 ENCOUNTER — Ambulatory Visit (INDEPENDENT_AMBULATORY_CARE_PROVIDER_SITE_OTHER): Payer: Medicaid Other

## 2020-10-16 VITALS — BP 140/95 | HR 57 | Wt 174.4 lb

## 2020-10-16 DIAGNOSIS — O165 Unspecified maternal hypertension, complicating the puerperium: Secondary | ICD-10-CM

## 2020-10-16 MED ORDER — AMLODIPINE BESYLATE 5 MG PO TABS: 5.0000 mg | ORAL_TABLET | Freq: Every day | ORAL | 0 refills | Status: DC

## 2020-10-16 NOTE — Addendum Note (Signed)
Addended by: Shawna Clamp R on: 10/16/2020 01:00 PM   Modules accepted: Orders

## 2020-10-16 NOTE — Progress Notes (Addendum)
   NURSE VISIT- BLOOD PRESSURE CHECK  SUBJECTIVE:  Sarah Rollins is a 20 y.o. G70P1001 female here for BP check. She is postpartum, delivery date 10/09/20    HYPERTENSION ROS:  Pregnant/postpartum:  . Severe headaches that don't go away with tylenol/other medicines: No  . Visual changes (seeing spots/double/blurred vision) Yes  . Severe pain under right breast breast or in center of upper chest No  . Severe nausea/vomiting No  . Taking medicines as instructed not applicable  OBJECTIVE:  BP (!) 140/95 (BP Location: Right Arm, Patient Position: Sitting, Cuff Size: Normal)   Pulse (!) 57   Wt 174 lb 6.4 oz (79.1 kg)   Breastfeeding Yes   BMI 32.95 kg/m   Appearance alert, well appearing, and in no distress, oriented to person, place, and time and normal appearing weight.  ASSESSMENT: Postpartum  blood pressure check  PLAN: Discussed with Joellyn Haff, CNM, [facility]   Recommendations: new prescription will be sent   Follow-up: 10/20/20   Sohana Austell A Valeen Borys  10/16/2020 11:12 AM   Chart reviewed for nurse visit. Agree with plan of care. rx norvasc 5mg  10/16/2020 12:59 PM

## 2020-10-20 ENCOUNTER — Ambulatory Visit (INDEPENDENT_AMBULATORY_CARE_PROVIDER_SITE_OTHER): Payer: Medicaid Other | Admitting: *Deleted

## 2020-10-20 ENCOUNTER — Other Ambulatory Visit: Payer: Self-pay

## 2020-10-20 VITALS — BP 105/73 | HR 97 | Ht 61.0 in | Wt 171.0 lb

## 2020-10-20 DIAGNOSIS — Z0130 Encounter for examination of blood pressure without abnormal findings: Secondary | ICD-10-CM

## 2020-10-20 NOTE — Progress Notes (Addendum)
   NURSE VISIT- BLOOD PRESSURE CHECK  SUBJECTIVE:  Sarah Rollins is a 20 y.o. G24P1001 female here for BP check. She is postpartum, delivery date 10/09/20    HYPERTENSION ROS:  Pregnant/postpartum:  . Severe headaches that don't go away with tylenol/other medicines: No  . Visual changes (seeing spots/double/blurred vision) Yes  . Severe pain under right breast breast or in center of upper chest No  . Severe nausea/vomiting No  . Taking medicines as instructed yes  .   OBJECTIVE:  BP 105/73 (BP Location: Left Arm, Patient Position: Sitting, Cuff Size: Normal)   Pulse 97   Ht 5\' 1"  (1.549 m)   Wt 171 lb (77.6 kg)   Breastfeeding Yes   BMI 32.31 kg/m   Appearance alert, well appearing, and in no distress.  ASSESSMENT: Postpartum  blood pressure check  PLAN: Discussed with Dr.   Recommendations: stop Norvasc, record BP readings daily x 1 week and schedule nurse visit BP check in 1 week.    Follow-up: 1 week for BP check with nurse.   Despina Hidden  10/20/2020 3:41 PM   Attestation of Attending Supervision of Nursing Visit Encounter: Evaluation and management procedures were performed by the nursing staff under my supervision and collaboration.  I have reviewed the nurse's note and chart, and I agree with the management and plan.  10/22/2020 MD Attending Physician for the [facility] 10/23/2020 10:38 AM

## 2020-10-27 ENCOUNTER — Other Ambulatory Visit: Payer: Self-pay

## 2020-10-27 ENCOUNTER — Ambulatory Visit (INDEPENDENT_AMBULATORY_CARE_PROVIDER_SITE_OTHER): Payer: Medicaid Other | Admitting: *Deleted

## 2020-10-27 ENCOUNTER — Encounter: Payer: Self-pay | Admitting: *Deleted

## 2020-10-27 VITALS — BP 115/81 | HR 90 | Ht 61.0 in | Wt 172.0 lb

## 2020-10-27 DIAGNOSIS — Z0130 Encounter for examination of blood pressure without abnormal findings: Secondary | ICD-10-CM

## 2020-10-27 NOTE — Progress Notes (Addendum)
   NURSE VISIT- BLOOD PRESSURE CHECK  SUBJECTIVE:  Sarah Rollins is a 20 y.o. G27P1001 female here for BP check. She is postpartum, delivery date 10/09/20    HYPERTENSION ROS:  Pregnant/postpartum:  . Severe headaches that don't go away with tylenol/other medicines: No  . Visual changes (seeing spots/double/blurred vision) No  . Severe pain under right breast breast or in center of upper chest No  . Severe nausea/vomiting No  . Taking medicines as instructed not applicable .   OBJECTIVE:  BP 115/81 (BP Location: Left Arm, Patient Position: Sitting, Cuff Size: Normal)   Pulse 90   Ht 5\' 1"  (1.549 m)   Wt 172 lb (78 kg)   Breastfeeding Yes   BMI 32.50 kg/m   Appearance alert, well appearing, and in no distress.  ASSESSMENT: Postpartum  blood pressure check  PLAN: Discussed with , CNM, [facility]   Recommendations: no changes needed   Follow-up: as scheduled   [facility]  10/27/2020 10:27 AM   Chart reviewed for nurse visit. Agree with plan of care.  10/29/2020, Cheral Marker 10/27/2020 1:39 PM

## 2020-11-07 ENCOUNTER — Other Ambulatory Visit: Payer: Self-pay | Admitting: Women's Health

## 2020-11-13 ENCOUNTER — Ambulatory Visit (INDEPENDENT_AMBULATORY_CARE_PROVIDER_SITE_OTHER): Payer: Medicaid Other | Admitting: Women's Health

## 2020-11-13 ENCOUNTER — Encounter: Payer: Self-pay | Admitting: Women's Health

## 2020-11-13 ENCOUNTER — Other Ambulatory Visit: Payer: Self-pay

## 2020-11-13 DIAGNOSIS — Z975 Presence of (intrauterine) contraceptive device: Secondary | ICD-10-CM | POA: Diagnosis not present

## 2020-11-13 DIAGNOSIS — F53 Postpartum depression: Secondary | ICD-10-CM

## 2020-11-13 DIAGNOSIS — O99345 Other mental disorders complicating the puerperium: Secondary | ICD-10-CM | POA: Diagnosis not present

## 2020-11-13 DIAGNOSIS — F418 Other specified anxiety disorders: Secondary | ICD-10-CM

## 2020-11-13 DIAGNOSIS — Z8659 Personal history of other mental and behavioral disorders: Secondary | ICD-10-CM | POA: Insufficient documentation

## 2020-11-13 MED ORDER — SERTRALINE HCL 25 MG PO TABS: 25.0000 mg | ORAL_TABLET | Freq: Every day | ORAL | 3 refills | Status: DC

## 2020-11-13 NOTE — Progress Notes (Signed)
POSTPARTUM VISIT Patient name: Sarah Rollins MRN [id number]  Date of birth: 19-Oct-2000 Chief Complaint:   Postpartum Care  History of Present Illness:   Sarah Rollins is a 20 y.o. G39P1001 Caucasian female being seen today for a postpartum visit. She is 5 weeks postpartum following a spontaneous vaginal delivery at 38.1 gestational weeks. IOL: yes, for gestational hypertension . Anesthesia: epidural.  Laceration: labial.  Complications: none. Inpatient contraception: yes Liletta IUD Placed postplacentally .   Pregnancy complicated by GHTN . Tobacco use: no. Substance use disorder: no. Last pap smear: 05/07/20 and results were ASCUS w/ HRHPV negative. Next pap smear due: 2024 No LMP recorded. (Menstrual status: IUD).  Postpartum course has been uncomplicated. Bleeding scant staining. Bowel function is normal. Bladder function is normal. Urinary incontinence? no, fecal incontinence? no Patient is sexually active.  Desired contraception: PP IUD placed. Patient does want a pregnancy in the future.  Desired family size is uncertain #of children.   Upstream - 11/13/20 1146       Pregnancy Intention Screening   Does the patient want to become pregnant in the next year? No    Does the patient's partner want to become pregnant in the next year? No    Would the patient like to discuss contraceptive options today? No      Contraception Wrap Up   Current Method IUD or IUS    End Method IUD or IUS    Contraception Counseling Provided Yes            The pregnancy intention screening data noted above was reviewed. Potential methods of contraception were discussed. The patient elected to proceed with IUD or IUS.   Edinburgh Postpartum Depression Screening: positive: H/O mental health disorder: no. Currently on meds: no.  Currently in therapy: no.  Sleeping: ok.  Appetite: none.  Still finds joy in things she used to: Yes.  Support at home: great.  SI/HI/II: no.  Interested in medicine: Yes.  Interested  in therapy: Yes.  Edinburgh Postnatal Depression Scale - 11/13/20 1146       Edinburgh Postnatal Depression Scale:  In the Past 7 Days   I have been able to laugh and see the funny side of things. 0    I have looked forward with enjoyment to things. 0    I have blamed myself unnecessarily when things went wrong. 2    I have been anxious or worried for no good reason. 3    I have felt scared or panicky for no good reason. 3    Things have been getting on top of me. 1    I have been so unhappy that I have had difficulty sleeping. 0    I have felt sad or miserable. 1    I have been so unhappy that I have been crying. 1    The thought of harming myself has occurred to me. 0    Edinburgh Postnatal Depression Scale Total 11             GAD 7 : Generalized Anxiety Score 07/25/2020 04/16/2020  Nervous, Anxious, on Edge 2 0  Control/stop worrying 1 0  Worry too much - different things 2 0  Trouble relaxing 1 0  Restless 0 0  Easily annoyed or irritable 2 0  Afraid - awful might happen 2 0  Total GAD 7 Score 10 0     Baby's course has been uncomplicated. Baby is feeding by bottle. Infant has a pediatrician/family doctor?  Yes.  Childcare strategy if returning to work/school: family.  Pt has material needs met for her and baby: Yes.   Review of Systems:   Pertinent items are noted in HPI Denies Abnormal vaginal discharge w/ itching/odor/irritation, headaches, visual changes, shortness of breath, chest pain, abdominal pain, severe nausea/vomiting, or problems with urination or bowel movements. Pertinent History Reviewed:  Reviewed past medical,surgical, obstetrical and family history.  Reviewed problem list, medications and allergies. OB History  Gravida Para Term Preterm AB Living  '1 1 1 ' 0 0 1  SAB IAB Ectopic Multiple Live Births  0 0 0 0 1    # Outcome Date GA Lbr Len/2nd Weight Sex Delivery Anes PTL Lv  1 Term 10/09/20 [id number]:54 / 01:34 6 lb 11 oz (3.033 kg) M Vag-Spont EPI   LIV   Physical Assessment:   Vitals:   11/13/20 1147  BP: 107/73  Pulse: 83  Weight: 173 lb (78.5 kg)  Body mass index is 32.69 kg/m.       Physical Examination:   General appearance: alert, well appearing, and in no distress  Mental status: alert, oriented to person, place, and time  Skin: warm & dry   Cardiovascular: normal heart rate noted   Respiratory: normal respiratory effort, no distress   Breasts: deferred, no complaints   Abdomen: soft, non-tender   Pelvic: lac well healed. IUD strings barely visible. Thin prep pap obtained: No  Rectal: not examined  Extremities: Edema: none   Informal TA u/s: IUD in place  Chaperone: Peggy Dones         No results found for this or any previous visit (from the past 24 hour(s)).  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  Mood and well being:  If positive depression screen, discussed and plan developed.  If using tobacco we discussed reduction/cessation and risk of relapse If current substance abuse, we discussed and referral to local resources was offered.   2. Infant care and feeding:  If breastfeeding, discussed returning to work, pumping, breastfeeding-associated pain, guidance regarding return to fertility while lactating if not using another method. If needed, patient was provided with a letter to be allowed to pump q 2-3hrs to support lactation in a private location with access to a refrigerator to store breastmilk.   Recommended that all caregivers be immunized for flu, pertussis and other preventable communicable diseases If pt does not have material needs met for her/baby, referred to local resources for help obtaining these.  3. Sexuality, contraception and  birth spacing Provided guidance regarding sexuality, management of dyspareunia, and resumption of intercourse Discussed avoiding interpregnancy interval <80m72m and recommended birth spacing of 18 months  4. Sleep and fatigue Discussed coping options for fatigue and sleep disruption Encouraged family/partner/community support of 4 hrs of uninterrupted sleep to help with mood and fatigue  5. Physical recovery  If pt had a C/S, assessed incisional pain and providing guidance on normal vs prolonged recovery If pt had a laceration, perineal healing and pain reviewed.  If urinary or fecal incontinence, discussed management and referred to PT or uro/gyn if indicated  Patient is safe to resume physical activity. Discussed attainment of healthy weight.  6.  Chronic disease management Discussed pregnancy complications if any, and  their implications for future childbearing and long-term maternal health. Review recommendations for prevention of recurrent pregnancy complications, such as 17 hydroxyprogesterone caproate to reduce risk for recurrent PTB not applicable, or aspirin to reduce risk of preeclampsia yes. Pt had GDM: no. If yes, 2hr GTT scheduled: not applicable. Reviewed medications and non-pregnant dosing including consideration of whether pt is breastfeeding using a reliable resource such as LactMed: not applicable Referred for f/u w/ PCP or subspecialist providers as indicated: not applicable  7. Health maintenance Mammogram at 22yo or earlier if indicated Pap smears as indicated  Meds:  Meds ordered this encounter  Medications   sertraline (ZOLOFT) 25 MG tablet    Sig: Take 1 tablet (25 mg total) by mouth daily.    Dispense:  90 tablet    Refill:  3    Order Specific Question:   Supervising Provider    Answer:   Florian Buff [[id number]]    Follow-up: Return in about 4 weeks (around 12/11/2020) for med f/u, CNM.   Orders Placed This Encounter  Procedures   Amb ref to [place], [facility] 11/13/2020 12:36 PM

## 2020-12-11 ENCOUNTER — Other Ambulatory Visit: Payer: Self-pay

## 2020-12-11 ENCOUNTER — Ambulatory Visit (INDEPENDENT_AMBULATORY_CARE_PROVIDER_SITE_OTHER): Payer: Medicaid Other | Admitting: Women's Health

## 2020-12-11 ENCOUNTER — Encounter: Payer: Self-pay | Admitting: Women's Health

## 2020-12-11 VITALS — BP 112/78 | HR 87 | Ht 62.0 in | Wt 179.0 lb

## 2020-12-11 DIAGNOSIS — G2581 Restless legs syndrome: Secondary | ICD-10-CM | POA: Diagnosis not present

## 2020-12-11 DIAGNOSIS — F53 Postpartum depression: Secondary | ICD-10-CM

## 2020-12-11 DIAGNOSIS — F418 Other specified anxiety disorders: Secondary | ICD-10-CM | POA: Diagnosis not present

## 2020-12-11 DIAGNOSIS — O99345 Other mental disorders complicating the puerperium: Secondary | ICD-10-CM | POA: Diagnosis not present

## 2020-12-11 LAB — POCT HEMOGLOBIN: Hemoglobin: 13.8 g/dL (ref 11–14.6)

## 2020-12-11 MED ORDER — SERTRALINE HCL 50 MG PO TABS: 50.0000 mg | ORAL_TABLET | Freq: Every day | ORAL | 3 refills | Status: DC

## 2020-12-11 MED ORDER — HYDROXYZINE PAMOATE 25 MG PO CAPS: 25.0000 mg | ORAL_CAPSULE | Freq: Three times a day (TID) | ORAL | 3 refills | Status: DC | PRN

## 2020-12-11 NOTE — Patient Instructions (Signed)
[facility]  °[address] °[place], [place] [address] °Phone: [phone] °Hours: Mon-Fri 8AM-5PM ° °If you're a walk-in patient there is generally a wait time involved on a "first come, first served basis" otherwise contact us beforehand to setup an appointment. If you're in crisis please use our 24-Hour Crisis Hotline for immediate access to a clinician. 24 Hour Crisis Line: [phone] [phone]  ° °If this is your first time please bring the following with you: °Insurance/Medicaid Card °ID or Social Security Card °Any referral documentation ° °Payment Options °Individuals with Medicaid have no obligation for a copay. °Individuals with Medicare or private insurance will be obligated to meet their policy's requirement(s). °Individuals who are uninsured will be eligible for a sliding or discounted scaled as defined by the relevant Managed Care Organization/Local Management Entity ° °Services:  °Substance Abuse Outpatient Treatment °Mental Health Outpatient Treatment °Psychiatric Services/ Medical Services °Advanced Access/Walk-In Clinic °Mobile Crisis Management Services °ACTT (Assertive Community Treatment Team) °Dialectic Behavioral Therapy °Critical Time Intervention °Psychosocial Rehabilitation (PSR) °Medication Assisted Treatment  °

## 2020-12-11 NOTE — BH Specialist Note (Deleted)
Integrated Behavioral Health via Telemedicine Visit  12/11/2020 Charlesetta Milliron [id number]  Number of Integrated Behavioral Health visits: 1 Session Start time: 1:15***  Session End time: 2:15*** Total time: {IBH Total FTDD:22025427}  Referring Provider: Shawna Clamp, CNM Patient/Family location: Home*** [facility] Provider location: [facility]  All persons participating in visit: Patient *** and [facility] [place] ***  Types of Service: {CHL AMB TYPE OF SERVICE:[phone]}  I connected with Filomena Jungling and/or Lyndal Pulley {family members:20773} via  Telephone or Video Enabled Telemedicine Application  (Video is Caregility application) and verified that I am speaking with the correct person using two identifiers. Discussed confidentiality: {YES/NO:21197}  I discussed the limitations of telemedicine and the availability of in person appointments.  Discussed there is a possibility of technology failure and discussed alternative modes of communication if that failure occurs.  I discussed that engaging in this telemedicine visit, they consent to the provision of behavioral healthcare and the services will be billed under their insurance.  Patient and/or legal guardian expressed understanding and consented to Telemedicine visit: {YES/NO:21197}  Presenting Concerns: Patient and/or family reports the following symptoms/concerns: *** Duration of problem: ***; Severity of problem: {Mild/Moderate/Severe:20260}  Patient and/or Family's Strengths/Protective Factors: {CHL AMB BH PROTECTIVE [phone]}  Goals Addressed: Patient will:  Reduce symptoms of: {IBH Symptoms:21014056}   Increase knowledge and/or ability of: {IBH Patient Tools:21014057}   Demonstrate ability to: {IBH Goals:21014053}  Progress towards Goals: {CHL AMB BH PROGRESS TOWARDS GOALS:[phone]}  Interventions: Interventions utilized:  {IBH  Interventions:21014054} Standardized Assessments completed: {IBH Screening Tools:21014051}  Patient and/or Family Response: ***  Assessment: Patient currently experiencing ***.   Patient may benefit from ***.  Plan: Follow up with behavioral health clinician on : *** Behavioral recommendations: *** Referral(s): {IBH Referrals:21014055}  I discussed the assessment and treatment plan with the patient and/or parent/guardian. They were provided an opportunity to ask questions and all were answered. They agreed with the plan and demonstrated an understanding of the instructions.   They were advised to call back or seek an in-person evaluation if the symptoms worsen or if the condition fails to improve as anticipated.  Valetta Close Nekeisha Aure, LCSW

## 2020-12-11 NOTE — Addendum Note (Signed)
Addended by: Moss Mc on: 12/11/2020 12:16 PM   Modules accepted: Orders

## 2020-12-11 NOTE — Progress Notes (Signed)
GYN VISIT Patient name: Sarah Rollins MRN [id number]  Date of birth: 06-24-2000 Chief Complaint:   medication check (Zoloft)  History of Present Illness:   Meghanne Pletz is a 20 y.o. G13P1001 Caucasian female s/p SVB being seen today for f/u on zoloft 25mg  rx'd 6/9 for PPD/anxiety.  Has had a lot going on since last visit, sister had covid, grandmother in bad wreck and broke multiple bones and she has been the one having to take care of her. Many panic attacks, restless legs at night, unable to sleep. No SI/HI/II or thoughts of harming self, but several days weekly she has thoughts of being better off dead. Hasn't heard from [facility] yet.  EPDS today 10, was 11 last visit No LMP recorded. (Menstrual status: IUD). The current method of family planning is IUD.  Last pap 05/07/20. Results were: ASCUS w/ HRHPV negative  Depression screen [facility] 2/9 12/11/2020 07/25/2020 04/16/2020  Decreased Interest 1 1 0  Down, Depressed, Hopeless 1 1 0  PHQ - 2 Score 2 2 0  Altered sleeping 3 2 1   Tired, decreased energy 2 2 1   Change in appetite 3 1 3   Feeling bad or failure about yourself  3 1 0  Trouble concentrating 1 1 0  Moving slowly or fidgety/restless 3 0 0  Suicidal thoughts 1 0 0  PHQ-9 Score 18 9 5   Difficult doing work/chores Somewhat difficult - -     GAD 7 : Generalized Anxiety Score 12/11/2020 07/25/2020 04/16/2020  Nervous, Anxious, on Edge 3 2 0  Control/stop worrying 1 1 0  Worry too much - different things 2 2 0  Trouble relaxing 3 1 0  Restless 3 0 0  Easily annoyed or irritable 1 2 0  Afraid - awful might happen 1 2 0  Total GAD 7 Score 14 10 0  Anxiety Difficulty Not difficult at all - -     Review of Systems:   Pertinent items are noted in HPI Denies fever/chills, dizziness, headaches, visual disturbances, fatigue, shortness of breath, chest pain, abdominal pain, vomiting, abnormal vaginal discharge/itching/odor/irritation, problems with periods, bowel movements, urination, or  intercourse unless otherwise stated above.  Pertinent History Reviewed:  Reviewed past medical,surgical, social, obstetrical and family history.  Reviewed problem list, medications and allergies. Physical Assessment:   Vitals:   12/11/20 1028  BP: 112/78  Pulse: 87  Weight: 179 lb (81.2 kg)  Height: 5\' 2"  (1.575 m)  Body mass index is 32.74 kg/m.       Physical Examination:   General appearance: alert, well appearing, and in no distress  Mental status: alert, oriented to person, place, and time, tearful  Skin: warm & dry   Cardiovascular: normal heart rate noted  Respiratory: normal respiratory effort, no distress  Abdomen: soft, non-tender   Pelvic: exam declined by the patient  Extremities: no edema   Chaperone: N/A    Fingerstick Hgb: 13+  No results found for this or any previous visit (from the past 24 hour(s)).  Assessment & Plan:  1) PPD/anx> w/ thoughts of feeling better off dead, no SI/HI/II or thoughts of self harm. Has had a lot of stress in life lately. Increase zoloft to 50mg  daily, add vistaril prn. Gave [facility] and 24hr crisis line info. Note routed to Jamie/IBH to check on referral/schedule appt. F/U 4wks  2) Restless legs> hgb normal, likely from anxiety  Meds:  Meds ordered this encounter  Medications   sertraline (ZOLOFT) 50 MG tablet  Sig: Take 1 tablet (50 mg total) by mouth daily.    Dispense:  30 tablet    Refill:  3    Order Specific Question:   Supervising Provider    Answer:   Duane Lope H [[id number]]   hydrOXYzine (VISTARIL) 25 MG capsule    Sig: Take 1 capsule (25 mg total) by mouth 3 (three) times daily as needed for anxiety.    Dispense:  60 capsule    Refill:  3    Order Specific Question:   Supervising Provider    Answer:   Despina Hidden, LUTHER H [[id number]]    No orders of the defined types were placed in this encounter.   Return in about 4 weeks (around 01/08/2021) for med f/u, in person, with me.  Cheral Marker CNM,  [facility] 12/11/2020 11:12 AM
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
## 2020-12-22 ENCOUNTER — Ambulatory Visit: Payer: Medicaid Other | Admitting: Clinical

## 2020-12-22 DIAGNOSIS — T7431XA Adult psychological abuse, confirmed, initial encounter: Secondary | ICD-10-CM

## 2020-12-22 DIAGNOSIS — F430 Acute stress reaction: Secondary | ICD-10-CM

## 2020-12-22 NOTE — BH Specialist Note (Signed)
Pt did not arrive to video visit and did not answer the phone; Unable to leave voice message as voicemail is not set up; left MyChart message for patient.   

## 2020-12-22 NOTE — Patient Instructions (Signed)
[facility] [address] [place], [place] [address] [phone] (main office) [phone] (Yavier Snider's office)  New mom online support:  www.postpartum.net   /[employer] and Websites Here are a few free apps meant to help you to help yourself.  To find, try searching on the internet to see if the app is offered on Apple/Android devices. If your first choice doesn't come up on your device, the good news is that there are many choices! Play around with different apps to see which ones are helpful to you.    Calm This is an app meant to help increase calm feelings. Includes info, strategies, and tools for tracking your feelings.      Calm Harm  This app is meant to help with self-harm. Provides many 5-minute or 15-min coping strategies for doing instead of hurting yourself.       Healthy Minds Health Minds is a problem-solving tool to help deal with emotions and cope with stress you encounter wherever you are.      MindShift This app can help people cope with anxiety. Rather than trying to avoid anxiety, you can make an important shift and face it.      MY3  MY3 features a support system, safety plan and resources with the goal of offering a tool to use in a time of need.       My Life My Voice  This mood journal offers a simple solution for tracking your thoughts, feelings and moods. Animated emoticons can help identify your mood.       Relax Melodies Designed to help with sleep, on this app you can mix sounds and meditations for relaxation.      Smiling Mind Smiling Mind is meditation made easy: it's a simple tool that helps put a smile on your mind.        Stop, Breathe & Think  A friendly, simple guide for people through meditations for mindfulness and compassion.  Stop, Breathe and Think Kids Enter your current feelings and choose a "mission" to help you cope. Offers videos for certain moods  instead of just sound recordings.       Team Orange The goal of this tool is to help teens change how they think, act, and react. This app helps you focus on your own good feelings and experiences.      The [employer] Box The [employer] (V[employer]) contains simple tools to help patients with coping, relaxation, distraction, and positive thinking.

## 2020-12-29 ENCOUNTER — Ambulatory Visit: Payer: Medicaid Other | Admitting: Clinical

## 2020-12-29 DIAGNOSIS — Z5329 Procedure and treatment not carried out because of patient's decision for other reasons: Secondary | ICD-10-CM

## 2020-12-29 DIAGNOSIS — Z91199 Patient's noncompliance with other medical treatment and regimen due to unspecified reason: Secondary | ICD-10-CM

## 2021-01-02 ENCOUNTER — Other Ambulatory Visit: Payer: Self-pay | Admitting: Women's Health

## 2021-01-08 ENCOUNTER — Ambulatory Visit: Payer: Medicaid Other | Admitting: Women's Health

## 2021-04-22 ENCOUNTER — Other Ambulatory Visit: Payer: Self-pay | Admitting: Women's Health

## 2021-08-04 ENCOUNTER — Ambulatory Visit: Payer: Medicaid Other | Admitting: Family Medicine

## 2021-08-04 ENCOUNTER — Encounter: Payer: Self-pay | Admitting: Family Medicine

## 2021-08-04 VITALS — BP 96/67 | HR 90 | Temp 97.8°F | Resp 20 | Ht 62.0 in | Wt 162.0 lb

## 2021-08-04 DIAGNOSIS — O9081 Anemia of the puerperium: Secondary | ICD-10-CM | POA: Insufficient documentation

## 2021-08-04 DIAGNOSIS — F53 Postpartum depression: Secondary | ICD-10-CM

## 2021-08-04 DIAGNOSIS — F418 Other specified anxiety disorders: Secondary | ICD-10-CM

## 2021-08-04 DIAGNOSIS — O99345 Other mental disorders complicating the puerperium: Secondary | ICD-10-CM | POA: Diagnosis not present

## 2021-08-04 DIAGNOSIS — Z6829 Body mass index (BMI) 29.0-29.9, adult: Secondary | ICD-10-CM | POA: Diagnosis not present

## 2021-08-04 DIAGNOSIS — R221 Localized swelling, mass and lump, neck: Secondary | ICD-10-CM

## 2021-08-04 MED ORDER — SERTRALINE HCL 100 MG PO TABS: 100.0000 mg | ORAL_TABLET | Freq: Every day | ORAL | 3 refills | Status: DC

## 2021-08-04 MED ORDER — HYDROXYZINE PAMOATE 50 MG PO CAPS: 50.0000 mg | ORAL_CAPSULE | Freq: Three times a day (TID) | ORAL | 3 refills | Status: DC | PRN

## 2021-08-04 NOTE — Progress Notes (Signed)
Subjective:  Patient ID: Sarah Rollins, female    DOB: [birth year], 21 y.o.   MRN: [id number]  Patient Care Team: Baruch Gouty, FNP as PCP - General (Family Medicine)   Chief Complaint:  Establish Care   HPI: Sarah Rollins is a 21 y.o. female presenting on 08/04/2021 for Establish Care   Pt presents today to establish care with new PCP. She has not been seen by a PCP in several years. She has been followed by her OB/GYN on a regular basis.  She has been started on sertraline for her postpartum anxiety and depression.  Feels this is no longer controlling her symptoms.  She states she was diagnosed with ADHD as a child but her mother was taking her medications instead of giving them to her as prescribed.  She has not been on any ADHD medications in several years.  She has recently moved out on her own and has a 67-monthold she is caring for her.  States that this is difficult at times due to her anxiety, depression, and lack of concentration.  She denies any SI or HI.  She feels that the as needed hydroxyzine is not adequately controlling her anxious spells.  She also reports an ongoing growth behind her right ear.  States this has been evaluated in the past but that was several years ago.  States the knot has become larger in size and is causing some discomfort at times.  No redness, drainage, fever, chills, headaches, weakness, or confusion.  She denies recent illnesses.  She reports a history of postpartum anemia and iron deficiency.  She is currently not on iron repletion therapy and states she has been bruising easier than normal.  [facility] 08/04/2021 12/22/2020 12/11/2020 07/25/2020 04/16/2020  Decreased Interest '2 3 1 1 ' 0  Down, Depressed, Hopeless '2 2 1 1 ' 0  PHQ - 2 Score '4 5 2 2 ' 0  Altered sleeping '3 3 3 2 1  ' Tired, decreased energy '3 3 2 2 1  ' Change in appetite '2 3 3 1 3  ' Feeling bad or failure about yourself  '1 3 3 1 ' 0  Trouble concentrating '1 3 1 1 ' 0  Moving slowly or  fidgety/restless 0 3 3 0 0  Suicidal thoughts 0 0 1 0 0  PHQ-9 Score '14 23 18 9 5  ' Difficult doing work/chores Very difficult - Somewhat difficult - -   GAD 7 : Generalized Anxiety Score 08/04/2021 12/22/2020 12/11/2020 07/25/2020  Nervous, Anxious, on Edge '3 3 3 2  ' Control/stop worrying '3 3 1 1  ' Worry too much - different things '3 3 2 2  ' Trouble relaxing 1 0 3 1  Restless '3 3 3 ' 0  Easily annoyed or irritable '3 3 1 2  ' Afraid - awful might happen '1 3 1 2  ' Total GAD 7 Score '17 18 14 10  ' Anxiety Difficulty - - Not difficult at all -       Relevant past medical, surgical, family, and social history reviewed and updated as indicated.  Allergies and medications reviewed and updated. Data reviewed: Chart in Epic.   Past Medical History:  Diagnosis Date   ADHD     Past Surgical History:  Procedure Laterality Date   WISDOM TOOTH EXTRACTION      Social History   Socioeconomic History   Marital status: Single    Spouse name: Not on file   Number of children: Not on file   Years of  education: Not on file   Highest education level: Not on file  Occupational History   Not on file  Tobacco Use   Smoking status: Never   Smokeless tobacco: Never  Vaping Use   Vaping Use: Never used  Substance and Sexual Activity   Alcohol use: Not Currently   Drug use: Not Currently    Types: Marijuana   Sexual activity: Yes    Birth control/protection: I.U.D.    Comment: intercourse age 44, more than 33 sexual partners  Other Topics Concern   Not on file  Social History Narrative   Not on file   Social Determinants of Health   Financial Resource Strain: Low Risk    Difficulty of Paying Living Expenses: Not very hard  Food Insecurity: No Food Insecurity   Worried About Charity fundraiser in the Last Year: Never true   Ran Out of Food in the Last Year: Never true  Transportation Needs: No Transportation Needs   Lack of Transportation (Medical): No   Lack of Transportation (Non-Medical):  No  Physical Activity: Insufficiently Active   Days of Exercise per Week: 3 days   Minutes of Exercise per Session: 30 min  Stress: Stress Concern Present   Feeling of Stress : Very much  Social Connections: Moderately Isolated   Frequency of Communication with Friends and Family: More than three times a week   Frequency of Social Gatherings with Friends and Family: More than three times a week   Attends Religious Services: Never   Marine scientist or Organizations: No   Attends Music therapist: Never   Marital Status: Living with partner  Intimate Partner Violence: Not At Risk   Fear of Current or Ex-Partner: No   Emotionally Abused: No   Physically Abused: No   Sexually Abused: No    Outpatient Encounter Medications as of 08/04/2021  Medication Sig   hydrOXYzine (VISTARIL) 50 MG capsule Take 1 capsule (50 mg total) by mouth 3 (three) times daily as needed.   levonorgestrel (LILETTA, 52 MG,) 20.1 MCG/DAY IUD 1 each by Intrauterine route once.   sertraline (ZOLOFT) 100 MG tablet Take 1 tablet (100 mg total) by mouth daily.   [DISCONTINUED] hydrOXYzine (VISTARIL) 25 MG capsule TAKE 1 CAPSULE BY MOUTH 3 TIMES DAILY AS NEEDED FOR ANXIETY.   [DISCONTINUED] sertraline (ZOLOFT) 50 MG tablet TAKE 1 TABLET BY MOUTH EVERY DAY   [DISCONTINUED] Blood Pressure Monitor MISC For regular home bp monitoring during pregnancy (Patient not taking: Reported on 11/13/2020)   [DISCONTINUED] coconut oil OIL Apply 1 application topically as needed. (Patient not taking: No sig reported)   [DISCONTINUED] Prenatal Vit-Fe Fumarate-FA (PRENATAL VITAMIN PO) Take by mouth.  (Patient not taking: No sig reported)   No facility-administered encounter medications on file as of 08/04/2021.    Allergies  Allergen Reactions   Ciprofloxacin Rash    Review of Systems  Constitutional:  Positive for activity change, appetite change and fatigue. Negative for chills, diaphoresis, fever and unexpected  weight change.  Eyes:  Negative for photophobia and visual disturbance.  Respiratory:  Negative for cough and shortness of breath.   Cardiovascular:  Negative for chest pain, palpitations and leg swelling.  Endocrine: Negative for cold intolerance, heat intolerance, polydipsia, polyphagia and polyuria.  Genitourinary:  Negative for decreased urine volume and difficulty urinating.  Musculoskeletal:        Swelling behind ear  Neurological:  Negative for dizziness, tremors, seizures, syncope, facial asymmetry, speech difficulty, weakness, light-headedness,  numbness and headaches.  Hematological:  Negative for adenopathy. Bruises/bleeds easily.  Psychiatric/Behavioral:  Positive for agitation, decreased concentration, dysphoric mood and sleep disturbance. Negative for behavioral problems, confusion, hallucinations, self-injury and suicidal ideas. The patient is nervous/anxious. The patient is not hyperactive.   All other systems reviewed and are negative.      Objective:  BP 96/67    Pulse 90    Temp 97.8 F (36.6 C) (Temporal)    Resp 20    Ht '5\' 2"'  (1.575 m)    Wt 162 lb (73.5 kg)    SpO2 95%    BMI 29.63 kg/m    Wt Readings from Last 3 Encounters:  08/04/21 162 lb (73.5 kg)  12/11/20 179 lb (81.2 kg)  11/13/20 173 lb (78.5 kg)    Physical Exam Vitals and nursing note reviewed.  Constitutional:      General: She is not in acute distress.    Appearance: Normal appearance. She is well-developed, well-groomed and overweight. She is not ill-appearing, toxic-appearing or diaphoretic.  HENT:     Head: Normocephalic and atraumatic.     Jaw: There is normal jaw occlusion.     Right Ear: Hearing, tympanic membrane, ear canal and external ear normal.     Left Ear: Hearing, tympanic membrane, ear canal and external ear normal.     Nose: Nose normal.     Mouth/Throat:     Lips: Pink.     Mouth: Mucous membranes are moist.     Pharynx: Oropharynx is clear. Uvula midline.  Eyes:      General: Lids are normal.     Extraocular Movements: Extraocular movements intact.     Conjunctiva/sclera: Conjunctivae normal.     Pupils: Pupils are equal, round, and reactive to light.  Neck:     Thyroid: No thyroid mass, thyromegaly or thyroid tenderness.     Vascular: No carotid bruit or JVD.     Trachea: Trachea and phonation normal.  Cardiovascular:     Rate and Rhythm: Normal rate and regular rhythm.     Chest Wall: PMI is not displaced.     Pulses: Normal pulses.     Heart sounds: Normal heart sounds. No murmur heard.   No friction rub. No gallop.  Pulmonary:     Effort: Pulmonary effort is normal. No respiratory distress.     Breath sounds: Normal breath sounds. No wheezing.  Abdominal:     General: Bowel sounds are normal. There is no distension or abdominal bruit.     Palpations: Abdomen is soft. There is no hepatomegaly or splenomegaly.     Tenderness: There is no abdominal tenderness. There is no right CVA tenderness or left CVA tenderness.     Hernia: No hernia is present.  Musculoskeletal:        General: Normal range of motion.     Cervical back: Normal range of motion and neck supple.     Right lower leg: No edema.     Left lower leg: No edema.  Lymphadenopathy:     Cervical: No cervical adenopathy.  Skin:    General: Skin is warm and dry.     Capillary Refill: Capillary refill takes less than 2 seconds.     Coloration: Skin is not cyanotic, jaundiced or pale.     Findings: Lesion present. No rash.       Neurological:     General: No focal deficit present.     Mental Status: She is alert and oriented  to person, place, and time.     Sensory: Sensation is intact.     Motor: Motor function is intact.     Coordination: Coordination is intact.     Gait: Gait is intact.     Deep Tendon Reflexes: Reflexes are normal and symmetric.  Psychiatric:        Attention and Perception: Attention and perception normal.        Mood and Affect: Mood and affect normal.         Speech: Speech normal.        Behavior: Behavior normal. Behavior is cooperative.        Thought Content: Thought content normal.        Cognition and Memory: Cognition and memory normal.        Judgment: Judgment normal.    Results for orders placed or performed in visit on 12/11/20  POCT hemoglobin  Result Value Ref Range   Hemoglobin 13.8 11 - 14.6 g/dL       Pertinent labs & imaging results that were available during my care of the patient were reviewed by me and considered in my medical decision making.  Assessment & Plan:  Shaquira was seen today for establish care.  Diagnoses and all orders for this visit:  Postpartum anxiety Postpartum depression Worsening symptoms over the last several months. Will increase dosing of sertraline and hydroxyzine. Pt also reports prior history of ADHD, was on medications as a child, has not been in a while. Will refer to psychiatry for evaluation.  -     CBC with Differential/Platelet -     CMP14+EGFR -     Lipid panel -     Thyroid Panel With TSH -     Ambulatory referral to Psychiatry -     sertraline (ZOLOFT) 100 MG tablet; Take 1 tablet (100 mg total) by mouth daily. -     hydrOXYzine (VISTARIL) 50 MG capsule; Take 1 capsule (50 mg total) by mouth 3 (three) times daily as needed.  Postpartum anemia Will recheck labs today. Iron rich diet discussed.  -     CBC with Differential/Platelet -     Iron, TIBC and Ferritin Panel  BMI 29.0-29.9,adult Diet and exercise encourage. Labs pending.  -     CBC with Differential/Platelet -     CMP14+EGFR -     Lipid panel -     Thyroid Panel With TSH  Mass in neck Mass noted to right mastoid, will obtain US for further evaluation.  -     US Soft Tissue Head/Neck (NON-THYROID); Future     Continue all other maintenance medications.  Follow up plan: Return in about 2 weeks (around 08/18/2021), or if symptoms worsen or fail to improve, for anxiety and depression .   Continue healthy  lifestyle choices, including diet (rich in fruits, vegetables, and lean proteins, and low in salt and simple carbohydrates) and exercise (at least 30 minutes of moderate physical activity daily).  Educational handout given for depression  The above assessment and management plan was discussed with the patient. The patient verbalized understanding of and has agreed to the management plan. Patient is aware to call the clinic if they develop any new symptoms or if symptoms persist or worsen. Patient is aware when to return to the clinic for a follow-up visit. Patient educated on when it is appropriate to go to the emergency department.   Monia Pouch, FNP-C [facility] [phone]

## 2021-08-04 NOTE — Patient Instructions (Signed)

## 2021-08-05 LAB — LIPID PANEL

## 2021-08-11 LAB — CMP14+EGFR
ALT: 7 IU/L (ref 0–32)
AST: 10 IU/L (ref 0–40)
Albumin/Globulin Ratio: 1.8 (ref 1.2–2.2)
Albumin: 4.6 g/dL (ref 3.9–5.0)
Alkaline Phosphatase: 73 IU/L (ref 44–121)
BUN/Creatinine Ratio: 9 (ref 9–23)
BUN: 7 mg/dL (ref 6–20)
Bilirubin Total: <0.2 mg/dL (ref 0.0–1.2)
CO2: 23 mmol/L (ref 20–29)
Calcium: 9.3 mg/dL (ref 8.7–10.2)
Chloride: 102 mmol/L (ref 96–106)
Creatinine, Ser: 0.74 mg/dL (ref 0.57–1.00)
Globulin, Total: 2.6 g/dL (ref 1.5–4.5)
Glucose: 86 mg/dL (ref 70–99)
Potassium: 4.5 mmol/L (ref 3.5–5.2)
Sodium: 138 mmol/L (ref 134–144)
Total Protein: 7.2 g/dL (ref 6.0–8.5)
eGFR: 117 mL/min/{1.73_m2} (ref >59)

## 2021-08-11 LAB — IRON,TIBC AND FERRITIN PANEL
Ferritin: 31 ng/mL (ref 15–150)
Iron Saturation: 36 % (ref 15–55)
Iron: 130 ug/dL (ref 27–159)
Total Iron Binding Capacity: 359 ug/dL (ref 250–450)
UIBC: 229 ug/dL (ref 131–425)

## 2021-08-11 LAB — CBC WITH DIFFERENTIAL/PLATELET
Basophils Absolute: 0 10*3/uL (ref 0.0–0.2)
Basos: 1 %
EOS (ABSOLUTE): 0 10*3/uL (ref 0.0–0.4)
Eos: 1 %
Hematocrit: 38.8 % (ref 34.0–46.6)
Hemoglobin: 13.4 g/dL (ref 11.1–15.9)
Immature Grans (Abs): 0 10*3/uL (ref 0.0–0.1)
Immature Granulocytes: 0 %
Lymphocytes Absolute: 1.4 10*3/uL (ref 0.7–3.1)
Lymphs: 28 %
MCH: 31.5 pg (ref 26.6–33.0)
MCHC: 34.5 g/dL (ref 31.5–35.7)
MCV: 91 fL (ref 79–97)
Monocytes Absolute: 0.5 10*3/uL (ref 0.1–0.9)
Monocytes: 10 %
Neutrophils Absolute: 3 10*3/uL (ref 1.4–7.0)
Neutrophils: 60 %
Platelets: 256 10*3/uL (ref 150–450)
RBC: 4.25 x10E6/uL (ref 3.77–5.28)
RDW: 11.9 % (ref 11.7–15.4)
WBC: 4.9 10*3/uL (ref 3.4–10.8)

## 2021-08-11 LAB — LIPID PANEL
Chol/HDL Ratio: 3.4 ratio (ref 0.0–4.4)
Cholesterol, Total: 179 mg/dL (ref 100–199)
HDL: 53 mg/dL (ref >39)
LDL Chol Calc (NIH): 113 mg/dL — ABNORMAL HIGH (ref 0–99)
Triglycerides: 66 mg/dL (ref 0–149)
VLDL Cholesterol Cal: 13 mg/dL (ref 5–40)

## 2021-08-11 LAB — THYROID PANEL WITH TSH
Free Thyroxine Index: 1.8 (ref 1.2–4.9)
T3 Uptake Ratio: 25 % (ref 24–39)
T4, Total: 7 ug/dL (ref 4.5–12.0)
TSH: 1.44 u[IU]/mL (ref 0.450–4.500)

## 2021-08-13 ENCOUNTER — Other Ambulatory Visit: Payer: Self-pay

## 2021-08-13 ENCOUNTER — Ambulatory Visit (HOSPITAL_COMMUNITY)
Admission: RE | Admit: 2021-08-13 | Discharge: 2021-08-13 | Disposition: A | Discharge status: Home / Self Care | Payer: Medicaid Other | Source: Ambulatory Visit | Attending: Family Medicine | Admitting: Family Medicine

## 2021-08-13 DIAGNOSIS — R221 Localized swelling, mass and lump, neck: Secondary | ICD-10-CM | POA: Diagnosis not present

## 2021-08-14 ENCOUNTER — Ambulatory Visit (HOSPITAL_COMMUNITY): Admission: RE | Admit: 2021-08-14 | Payer: Medicaid Other | Source: Ambulatory Visit

## 2021-08-18 ENCOUNTER — Ambulatory Visit (INDEPENDENT_AMBULATORY_CARE_PROVIDER_SITE_OTHER): Payer: Medicaid Other | Admitting: Family Medicine

## 2021-08-18 ENCOUNTER — Encounter: Payer: Self-pay | Admitting: Family Medicine

## 2021-08-18 DIAGNOSIS — O99345 Other mental disorders complicating the puerperium: Secondary | ICD-10-CM | POA: Diagnosis not present

## 2021-08-18 DIAGNOSIS — F418 Other specified anxiety disorders: Secondary | ICD-10-CM

## 2021-08-18 DIAGNOSIS — F53 Postpartum depression: Secondary | ICD-10-CM | POA: Diagnosis not present

## 2021-08-18 MED ORDER — SERTRALINE HCL 50 MG PO TABS: 50.0000 mg | ORAL_TABLET | Freq: Every day | ORAL | 1 refills | Status: DC

## 2021-08-18 MED ORDER — HYDROXYZINE PAMOATE 50 MG PO CAPS: 100.0000 mg | ORAL_CAPSULE | Freq: Three times a day (TID) | ORAL | 3 refills | Status: DC | PRN

## 2021-08-18 NOTE — Patient Instructions (Signed)

## 2021-08-18 NOTE — Progress Notes (Signed)
Subjective:  Patient ID: Sarah Rollins, female    DOB: August 01, 2000, 21 y.o.   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 female presenting on 08/18/2021 for 2 week follow up post partum depression   Pt presents today for follow up visit. At last visit her sertraline and vistaril doses were increased and she was referred to psychiatry. She has not been to psychiatry to date. States she is starting to feel slightly better but not at 100%. States she still gets pretty anxious at times. She is taking medications as prescribed without adverse side effects. She denies sedation or fatigue with vistaril. No SI or HI. States she has been able to clean her house and wash her clothes, which she did not do prior to last visit.   GAD 7 : Generalized Anxiety Score 08/18/2021 08/04/2021 12/22/2020 12/11/2020  Nervous, Anxious, on Edge 3 3 3 3   Control/stop worrying 2 3 3 1   Worry too much - different things 2 3 3 2   Trouble relaxing 1 1 0 3  Restless 1 3 3 3   Easily annoyed or irritable 2 3 3 1   Afraid - awful might happen 3 1 3 1   Total GAD 7 Score 14 17 18 14   Anxiety Difficulty Extremely difficult - - Not difficult at all    Depression screen [facility] 2/9 08/18/2021 08/04/2021 12/22/2020 12/11/2020 07/25/2020  Decreased Interest 2 2 3 1 1   Down, Depressed, Hopeless 1 2 2 1 1   PHQ - 2 Score 3 4 5 2 2   Altered sleeping 1 3 3 3 2   Tired, decreased energy 2 3 3 2 2   Change in appetite 2 2 3 3 1   Feeling bad or failure about yourself  2 1 3 3 1   Trouble concentrating 3 1 3 1 1   Moving slowly or fidgety/restless 2 0 3 3 0  Suicidal thoughts 0 0 0 1 0  PHQ-9 Score 15 14 23 18 9   Difficult doing work/chores - Very difficult - Somewhat difficult -        Relevant past medical, surgical, family, and social history reviewed and updated as indicated.  Allergies and  medications reviewed and updated. Data reviewed: Chart in Epic.   Past Medical History:  Diagnosis Date   ADHD     Past Surgical History:  Procedure Laterality Date   WISDOM TOOTH EXTRACTION      Social History   Socioeconomic History   Marital status: Single    Spouse name: Not on file   Number of children: Not on file   Years of education: Not on file   Highest education level: Not on file  Occupational History   Not on file  Tobacco Use   Smoking status: Never   Smokeless tobacco: Never  Vaping Use   Vaping Use: Never used  Substance and Sexual Activity   Alcohol use: Not Currently   Drug use: Not Currently    Types: Marijuana   Sexual activity: Yes    Birth control/protection: I.U.D.    Comment: intercourse age 22, more than 5 sexual partners  Other Topics Concern   Not on file  Social History Narrative   Not on file   Social Determinants of Health   Financial Resource Strain: Low Risk    Difficulty of Paying Living Expenses:  Not very hard  Food Insecurity: No Food Insecurity   Worried About Programme researcher, broadcasting/film/video in the Last Year: Never true   Ran Out of Food in the Last Year: Never true  Transportation Needs: No Transportation Needs   Lack of Transportation (Medical): No   Lack of Transportation (Non-Medical): No  Physical Activity: Insufficiently Active   Days of Exercise per Week: 3 days   Minutes of Exercise per Session: 30 min  Stress: Stress Concern Present   Feeling of Stress : Very much  Social Connections: Moderately Isolated   Frequency of Communication with Friends and Family: More than three times a week   Frequency of Social Gatherings with Friends and Family: More than three times a week   Attends Religious Services: Never   Database administrator or Organizations: No   Attends Engineer, structural: Never   Marital Status: Living with partner  Intimate Partner Violence: Not At Risk   Fear of Current or Ex-Partner: No    Emotionally Abused: No   Physically Abused: No   Sexually Abused: No    Outpatient Encounter Medications as of 08/18/2021  Medication Sig   fluticasone (FLONASE) 50 MCG/ACT nasal spray SPRAY 1 SPRAY INTO EACH NOSTRIL EVERY DAY   levonorgestrel (LILETTA, 52 MG,) 20.1 MCG/DAY IUD 1 each by Intrauterine route once.   sertraline (ZOLOFT) 100 MG tablet Take 1 tablet (100 mg total) by mouth daily.   sertraline (ZOLOFT) 50 MG tablet Take 1 tablet (50 mg total) by mouth daily.   [DISCONTINUED] hydrOXYzine (VISTARIL) 50 MG capsule Take 1 capsule (50 mg total) by mouth 3 (three) times daily as needed.   hydrOXYzine (VISTARIL) 50 MG capsule Take 2 capsules (100 mg total) by mouth 3 (three) times daily as needed.   No facility-administered encounter medications on file as of 08/18/2021.    Allergies  Allergen Reactions   Ciprofloxacin Rash    Review of Systems  Constitutional:  Positive for activity change, appetite change and fatigue. Negative for chills, diaphoresis, fever and unexpected weight change.  Respiratory:  Negative for cough and shortness of breath.   Cardiovascular:  Negative for chest pain, palpitations and leg swelling.  Gastrointestinal:  Negative for abdominal pain, diarrhea, nausea and vomiting.  Neurological:  Negative for headaches.  Psychiatric/Behavioral:  Positive for agitation, decreased concentration, dysphoric mood and sleep disturbance. Negative for behavioral problems, confusion, hallucinations, self-injury and suicidal ideas. The patient is nervous/anxious. The patient is not hyperactive.   All other systems reviewed and are negative.      Objective:  BP 111/70   Pulse 78   Temp 98.7 F (37.1 C)   Ht 5\' 2"  (1.575 m)   Wt 167 lb (75.8 kg)   SpO2 95%   BMI 30.54 kg/m    Wt Readings from Last 3 Encounters:  08/18/21 167 lb (75.8 kg)  08/04/21 162 lb (73.5 kg)  12/11/20 179 lb (81.2 kg)    Physical Exam Vitals and nursing note reviewed.  Constitutional:       Appearance: Normal appearance. She is obese.  HENT:     Head: Normocephalic and atraumatic.  Eyes:     Pupils: Pupils are equal, round, and reactive to light.  Cardiovascular:     Rate and Rhythm: Normal rate and regular rhythm.     Heart sounds: Normal heart sounds. No murmur heard.   No friction rub. No gallop.  Pulmonary:     Effort: Pulmonary effort is normal.  Breath sounds: Normal breath sounds.  Skin:    General: Skin is warm and dry.     Capillary Refill: Capillary refill takes less than 2 seconds.  Neurological:     General: No focal deficit present.     Mental Status: She is alert and oriented to person, place, and time.  Psychiatric:        Mood and Affect: Mood normal.        Behavior: Behavior normal.        Thought Content: Thought content normal.        Judgment: Judgment normal.    Results for orders placed or performed in visit on 08/04/21  CBC with Differential/Platelet  Result Value Ref Range   WBC 4.9 3.4 - 10.8 x10E3/uL   RBC 4.25 3.77 - 5.28 x10E6/uL   Hemoglobin 13.4 11.1 - 15.9 g/dL   Hematocrit 08.6 57.8 - 46.6 %   MCV 91 79 - 97 fL   MCH 31.5 26.6 - 33.0 pg   MCHC 34.5 31.5 - 35.7 g/dL   RDW 46.9 62.9 - 52.8 %   Platelets 256 150 - 450 x10E3/uL   Neutrophils 60 Not Estab. %   Lymphs 28 Not Estab. %   Monocytes 10 Not Estab. %   Eos 1 Not Estab. %   Basos 1 Not Estab. %   Neutrophils Absolute 3.0 1.4 - 7.0 x10E3/uL   Lymphocytes Absolute 1.4 0.7 - 3.1 x10E3/uL   Monocytes Absolute 0.5 0.1 - 0.9 x10E3/uL   EOS (ABSOLUTE) 0.0 0.0 - 0.4 x10E3/uL   Basophils Absolute 0.0 0.0 - 0.2 x10E3/uL   Immature Granulocytes 0 Not Estab. %   Immature Grans (Abs) 0.0 0.0 - 0.1 x10E3/uL  CMP14+EGFR  Result Value Ref Range   Glucose 86 70 - 99 mg/dL   BUN 7 6 - 20 mg/dL   Creatinine, Ser 4.13 0.57 - 1.00 mg/dL   eGFR 244 >01 UU/VOZ/3.66   BUN/Creatinine Ratio 9 9 - 23   Sodium 138 134 - 144 mmol/L   Potassium 4.5 3.5 - 5.2 mmol/L   Chloride 102  96 - 106 mmol/L   CO2 23 20 - 29 mmol/L   Calcium 9.3 8.7 - 10.2 mg/dL   Total Protein 7.2 6.0 - 8.5 g/dL   Albumin 4.6 3.9 - 5.0 g/dL   Globulin, Total 2.6 1.5 - 4.5 g/dL   Albumin/Globulin Ratio 1.8 1.2 - 2.2   Bilirubin Total <0.2 0.0 - 1.2 mg/dL   Alkaline Phosphatase 73 44 - 121 IU/L   AST 10 0 - 40 IU/L   ALT 7 0 - 32 IU/L  Lipid panel  Result Value Ref Range   Cholesterol, Total 179 100 - 199 mg/dL   Triglycerides 66 0 - 149 mg/dL   HDL 53 >44 mg/dL   VLDL Cholesterol Cal 13 5 - 40 mg/dL   LDL Chol Calc (NIH) 034 (H) 0 - 99 mg/dL   Chol/HDL Ratio 3.4 0.0 - 4.4 ratio  Thyroid Panel With TSH  Result Value Ref Range   TSH 1.440 0.450 - 4.500 uIU/mL   T4, Total 7.0 4.5 - 12.0 ug/dL   T3 Uptake Ratio 25 24 - 39 %   Free Thyroxine Index 1.8 1.2 - 4.9  Iron, TIBC and Ferritin Panel  Result Value Ref Range   Total Iron Binding Capacity 359 250 - 450 ug/dL   UIBC 742 595 - 638 ug/dL   Iron 756 27 - 433 ug/dL   Iron Saturation 36  15 - 55 %   Ferritin 31 15 - 150 ng/mL       Pertinent labs & imaging results that were available during my care of the patient were reviewed by me and considered in my medical decision making.  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  Diagnoses and all orders for this visit:  Postpartum anxiety Postpartum depression Doing well but not back to baseline. Will increase zoloft to 150 mg daily and vistaril to 100 mg three times daily as needed for significant anxiety. Follow up in 3 months or sooner if warranted.  -     hydrOXYzine (VISTARIL) 50 MG capsule; Take 2 capsules (100 mg total) by mouth 3 (three) times daily as needed. -     sertraline (ZOLOFT) 50 MG tablet; Take 1 tablet (50 mg total) by mouth daily.     Continue all other maintenance medications.  Follow up plan: Return in about 3 months (around 11/18/2021), or if symptoms worsen or fail to improve.   Continue healthy lifestyle choices,  including diet (rich in fruits, vegetables, and lean proteins, and low in salt and simple carbohydrates) and exercise (at least 30 minutes of moderate physical activity daily).  Educational handout given for depression, crisis hotline numbers  The above assessment and management plan was discussed with the patient. The patient verbalized understanding of and has agreed to the management plan. Patient is aware to call the clinic if they develop any new symptoms or if symptoms persist or worsen. Patient is aware when to return to the clinic for a follow-up visit. Patient educated on when it is appropriate to go to the emergency department.   Kari Baars, FNP-C [facility] [phone]

## 2021-08-29 ENCOUNTER — Other Ambulatory Visit: Payer: Self-pay | Admitting: Family Medicine

## 2021-08-29 DIAGNOSIS — F53 Postpartum depression: Secondary | ICD-10-CM

## 2021-08-29 DIAGNOSIS — F418 Other specified anxiety disorders: Secondary | ICD-10-CM

## 2021-09-01 ENCOUNTER — Other Ambulatory Visit: Payer: Self-pay | Admitting: Family Medicine

## 2021-09-01 DIAGNOSIS — F53 Postpartum depression: Secondary | ICD-10-CM

## 2021-09-01 DIAGNOSIS — O99345 Other mental disorders complicating the puerperium: Secondary | ICD-10-CM

## 2021-09-01 DIAGNOSIS — F418 Other specified anxiety disorders: Secondary | ICD-10-CM

## 2021-09-18 ENCOUNTER — Ambulatory Visit (INDEPENDENT_AMBULATORY_CARE_PROVIDER_SITE_OTHER): Payer: Medicaid Other | Admitting: Family Medicine

## 2021-09-18 ENCOUNTER — Encounter: Payer: Self-pay | Admitting: Family Medicine

## 2021-09-18 VITALS — BP 120/73 | HR 106 | Temp 98.4°F | Resp 20 | Ht 62.0 in | Wt 163.0 lb

## 2021-09-18 DIAGNOSIS — O99345 Other mental disorders complicating the puerperium: Secondary | ICD-10-CM | POA: Diagnosis not present

## 2021-09-18 DIAGNOSIS — F418 Other specified anxiety disorders: Secondary | ICD-10-CM | POA: Diagnosis not present

## 2021-09-18 DIAGNOSIS — F53 Postpartum depression: Secondary | ICD-10-CM

## 2021-09-18 NOTE — Progress Notes (Signed)
?  ? ?Subjective:  ?Patient ID: Sarah Rollins, female    DOB: 09/22/2000, 21 y.o.   MRN: [id number] ? ?Patient Care Team: ?Sonny Masters, FNP as PCP - General (Family Medicine)  ? ?Chief Complaint:  Discuss meds (Needs letter for her dog for her apartment/) ? ? ?HPI: ?Sarah Rollins is a 21 y.o. female presenting on 09/18/2021 for Discuss meds (Needs letter for her dog for her apartment/) ? ? ?1. Postpartum anxiety ?2. Postpartum depression ?Pt presents today to obtain a letter for an emotional support dog in her dwelling. She has significant anxiety and depression which is worse when her son is staying with his father. She had had a dog in the past with great relief of anxiety and would like to get another one to help with her anxiety.  ?At last visit she was to increase her sertraline to 150 mg but she has not done this. She did increase the vistaril but feels the dosing makes her sleepy.  ? ? ?  09/18/2021  ?  2:24 PM 08/18/2021  ? 12:12 PM 08/04/2021  ?  9:33 AM 12/22/2020  ? 10:37 AM 12/11/2020  ? 10:34 AM  ?Depression screen PHQ 2/9  ?Decreased Interest 2 2 2 3 1   ?Down, Depressed, Hopeless 2 1 2 2 1   ?PHQ - 2 Score 4 3 4 5 2   ?Altered sleeping 2 1 3 3 3   ?Tired, decreased energy 2 2 3 3 2   ?Change in appetite 1 2 2 3 3   ?Feeling bad or failure about yourself  3 2 1 3 3   ?Trouble concentrating 3 3 1 3 1   ?Moving slowly or fidgety/restless 2 2 0 3 3  ?Suicidal thoughts 0 0 0 0 1  ?PHQ-9 Score 17 15 14 23 18   ?Difficult doing work/chores Very difficult  Very difficult  Somewhat difficult  ? ? ?  09/18/2021  ?  2:24 PM 08/18/2021  ? 12:12 PM 08/04/2021  ?  9:34 AM 12/22/2020  ? 10:42 AM  ?GAD 7 : Generalized Anxiety Score  ?Nervous, Anxious, on Edge 3 3 3 3   ?Control/stop worrying 2 2 3 3   ?Worry too much - different things 2 2 3 3   ?Trouble relaxing 2 1 1  0  ?Restless 3 1 3 3   ?Easily annoyed or irritable 1 2 3 3   ?Afraid - awful might happen 2 3 1 3   ?Total GAD 7 Score 15 14 17 18   ?Anxiety Difficulty Not difficult at all  Extremely difficult    ? ? ? ?  ? ? ?Relevant past medical, surgical, family, and social history reviewed and updated as indicated.  ?Allergies and medications reviewed and updated. Data reviewed: Chart in Epic. ? ? ?Past Medical History:  ?Diagnosis Date  ? ADHD   ? ? ?Past Surgical History:  ?Procedure Laterality Date  ? WISDOM TOOTH EXTRACTION    ? ? ?Social History  ? ?Socioeconomic History  ? Marital status: Single  ?  Spouse name: Not on file  ? Number of children: Not on file  ? Years of education: Not on file  ? Highest education level: Not on file  ?Occupational History  ? Not on file  ?Tobacco Use  ? Smoking status: Never  ? Smokeless tobacco: Never  ?Vaping Use  ? Vaping Use: Never used  ?Substance and Sexual Activity  ? Alcohol use: Not Currently  ? Drug use: Not Currently  ?  Types: Marijuana  ? Sexual activity: Yes  ?  Birth control/protection: I.U.D.  ?  Comment: intercourse age 21, more than 5 sexual partners  ?Other Topics Concern  ? Not on file  ?Social History Narrative  ? Not on file  ? ?Social Determinants of Health  ? ?Financial Resource Strain: Low Risk   ? Difficulty of Paying Living Expenses: Not very hard  ?Food Insecurity: No Food Insecurity  ? Worried About Programme researcher, broadcasting/film/videounning Out of Food in the Last Year: Never true  ? Ran Out of Food in the Last Year: Never true  ?Transportation Needs: No Transportation Needs  ? Lack of Transportation (Medical): No  ? Lack of Transportation (Non-Medical): No  ?Physical Activity: Insufficiently Active  ? Days of Exercise per Week: 3 days  ? Minutes of Exercise per Session: 30 min  ?Stress: Stress Concern Present  ? Feeling of Stress : Very much  ?Social Connections: Moderately Isolated  ? Frequency of Communication with Friends and Family: More than three times a week  ? Frequency of Social Gatherings with Friends and Family: More than three times a week  ? Attends Religious Services: Never  ? Active Member of Clubs or Organizations: No  ? Attends Tax inspectorClub or  Organization Meetings: Never  ? Marital Status: Living with partner  ?Intimate Partner Violence: Not At Risk  ? Fear of Current or Ex-Partner: No  ? Emotionally Abused: No  ? Physically Abused: No  ? Sexually Abused: No  ? ? ?Outpatient Encounter Medications as of 09/18/2021  ?Medication Sig  ? hydrOXYzine (VISTARIL) 50 MG capsule TAKE 2 CAPSULES (100 MG TOTAL) BY MOUTH 3 (THREE) TIMES DAILY AS NEEDED.  ? levonorgestrel (LILETTA, 52 MG,) 20.1 MCG/DAY IUD 1 each by Intrauterine route once.  ? sertraline (ZOLOFT) 100 MG tablet Take 1 tablet (100 mg total) by mouth daily.  ? fluticasone (FLONASE) 50 MCG/ACT nasal spray SPRAY 1 SPRAY INTO EACH NOSTRIL EVERY DAY (Patient not taking: Reported on 09/18/2021)  ? sertraline (ZOLOFT) 50 MG tablet Take 1 tablet (50 mg total) by mouth daily. (Patient not taking: Reported on 09/18/2021)  ? ?No facility-administered encounter medications on file as of 09/18/2021.  ? ? ?Allergies  ?Allergen Reactions  ? Ciprofloxacin Rash  ? ? ?Review of Systems  ?Constitutional:  Positive for activity change, appetite change and fatigue. Negative for chills, diaphoresis, fever and unexpected weight change.  ?HENT: Negative.    ?Eyes: Negative.   ?Respiratory:  Negative for cough, chest tightness and shortness of breath.   ?Cardiovascular:  Negative for chest pain, palpitations and leg swelling.  ?Gastrointestinal:  Negative for abdominal pain, blood in stool, constipation, diarrhea, nausea and vomiting.  ?Endocrine: Negative.   ?Genitourinary:  Negative for decreased urine volume, difficulty urinating, dysuria, frequency and urgency.  ?Musculoskeletal:  Negative for arthralgias and myalgias.  ?Skin: Negative.   ?Allergic/Immunologic: Negative.   ?Neurological:  Negative for dizziness and headaches.  ?Hematological: Negative.   ?Psychiatric/Behavioral:  Positive for agitation, decreased concentration, dysphoric mood and sleep disturbance. Negative for behavioral problems, confusion, hallucinations,  self-injury and suicidal ideas. The patient is nervous/anxious. The patient is not hyperactive.   ?All other systems reviewed and are negative. ? ?   ? ?Objective:  ?BP 120/73   Pulse (!) 106   Temp 98.4 ?F (36.9 ?C) (Temporal)   Resp 20   Ht 5\' 2"  (1.575 m)   Wt 163 lb (73.9 kg)   SpO2 98%   BMI 29.81 kg/m?   ? ?Wt Readings from Last 3 Encounters:  ?09/18/21 163 lb (73.9 kg)  ?08/18/21  167 lb (75.8 kg)  ?08/04/21 162 lb (73.5 kg)  ? ? ?Physical Exam ?Vitals and nursing note reviewed.  ?Constitutional:   ?   General: She is not in acute distress. ?   Appearance: Normal appearance. She is well-developed, well-groomed and overweight. She is not ill-appearing, toxic-appearing or diaphoretic.  ?HENT:  ?   Head: Normocephalic and atraumatic.  ?   Jaw: There is normal jaw occlusion.  ?   Right Ear: Hearing normal.  ?   Left Ear: Hearing normal.  ?   Nose: Nose normal.  ?   Mouth/Throat:  ?   Lips: Pink.  ?   Mouth: Mucous membranes are moist.  ?   Pharynx: Oropharynx is clear. Uvula midline.  ?Eyes:  ?   General: Lids are normal.  ?   Conjunctiva/sclera: Conjunctivae normal.  ?   Pupils: Pupils are equal, round, and reactive to light.  ?Neck:  ?   Thyroid: No thyroid mass, thyromegaly or thyroid tenderness.  ?   Vascular: No carotid bruit or JVD.  ?   Trachea: Trachea and phonation normal.  ?Cardiovascular:  ?   Rate and Rhythm: Normal rate and regular rhythm.  ?   Chest Wall: PMI is not displaced.  ?   Heart sounds: Normal heart sounds.  ?Pulmonary:  ?   Effort: Pulmonary effort is normal.  ?Abdominal:  ?   General: There is no abdominal bruit.  ?   Palpations: Abdomen is soft. There is no hepatomegaly or splenomegaly.  ?Musculoskeletal:  ?   Cervical back: Normal range of motion and neck supple.  ?Lymphadenopathy:  ?   Cervical: No cervical adenopathy.  ?Skin: ?   General: Skin is warm and dry.  ?   Capillary Refill: Capillary refill takes less than 2 seconds.  ?   Coloration: Skin is not cyanotic, jaundiced or  pale.  ?   Findings: No rash.  ?Neurological:  ?   General: No focal deficit present.  ?   Mental Status: She is alert and oriented to person, place, and time.  ?   Sensory: Sensation is intact.  ?   Motor: Motor function is

## 2021-11-18 ENCOUNTER — Ambulatory Visit: Payer: Medicaid Other | Admitting: Family Medicine

## 2021-11-25 ENCOUNTER — Encounter: Payer: Self-pay | Admitting: Family Medicine

## 2021-11-25 ENCOUNTER — Ambulatory Visit: Payer: Medicaid Other | Admitting: Family Medicine

## 2022-01-04 ENCOUNTER — Telehealth (INDEPENDENT_AMBULATORY_CARE_PROVIDER_SITE_OTHER): Payer: Medicaid Other | Admitting: Family Medicine

## 2022-01-04 ENCOUNTER — Encounter: Payer: Self-pay | Admitting: Family Medicine

## 2022-01-04 DIAGNOSIS — H1033 Unspecified acute conjunctivitis, bilateral: Secondary | ICD-10-CM | POA: Diagnosis not present

## 2022-01-04 DIAGNOSIS — H66001 Acute suppurative otitis media without spontaneous rupture of ear drum, right ear: Secondary | ICD-10-CM | POA: Diagnosis not present

## 2022-01-04 MED ORDER — TOBRAMYCIN-DEXAMETHASONE 0.3-0.1 % OP SUSP: OPHTHALMIC | 0 refills | Status: DC

## 2022-01-04 MED ORDER — AMOXICILLIN-POT CLAVULANATE 875-125 MG PO TABS: 1.0000 | ORAL_TABLET | Freq: Two times a day (BID) | ORAL | 0 refills | Status: DC

## 2022-01-04 NOTE — Progress Notes (Signed)
Subjective:  Patient ID: Sarah Rollins, female    DOB: [birth year]  Age: 21 y.o. MRN: [id number]  CC: No chief complaint on file.   HPI Sarah Rollins presents for redness in the eyes with itching and pain.  This started last evening.  Increased today.  She had some mattering in the left eye that made it difficult to open.  Since then she has actually had more mattering and discharge from the right eye.  Also her right ear is very painful.  Her hearing is intact.  She has had no fever chills sweats.  She has no other upper respiratory symptoms.     09/18/2021    2:24 PM 08/18/2021   12:12 PM 08/04/2021    9:33 AM  Depression screen PHQ 2/9  Decreased Interest 2 2 2   Down, Depressed, Hopeless 2 1 2   PHQ - 2 Score 4 3 4   Altered sleeping 2 1 3   Tired, decreased energy 2 2 3   Change in appetite 1 2 2   Feeling bad or failure about yourself  3 2 1   Trouble concentrating 3 3 1   Moving slowly or fidgety/restless 2 2 0  Suicidal thoughts 0 0 0  PHQ-9 Score 17 15 14   Difficult doing work/chores Very difficult  Very difficult    History Sarah Rollins has a past medical history of ADHD.   She has a past surgical history that includes Wisdom tooth extraction.   Her family history includes Anxiety disorder in her sister; Breast cancer in her paternal grandmother; Colon cancer in her maternal grandfather; Depression in her sister; Heart Problems in her mother; Hypertension in her mother.She reports that she has never smoked. She has never used smokeless tobacco. She reports that she does not currently use alcohol. She reports that she does not currently use drugs after having used the following drugs: Marijuana.    ROS Review of Systems  Constitutional: Negative.   HENT: Negative.    Eyes:  Negative for visual disturbance.  Respiratory:  Negative for shortness of breath.   Cardiovascular:  Negative for chest pain.  Gastrointestinal:  Negative for abdominal pain.  Musculoskeletal:  Negative for  arthralgias.    Objective:  There were no vitals taken for this visit.  BP Readings from Last 3 Encounters:  09/18/21 120/73  08/18/21 111/70  08/04/21 96/67    Wt Readings from Last 3 Encounters:  09/18/21 163 lb (73.9 kg)  08/18/21 167 lb (75.8 kg)  08/04/21 162 lb (73.5 kg)   Exam via video:  Physical Exam Constitutional:      General: She is not in acute distress.    Appearance: Normal appearance. She is well-developed.  HENT:     Head: Normocephalic.     Right Ear: External ear normal.     Left Ear: External ear normal.  Eyes:     General:        Right eye: Discharge present.        Left eye: Discharge present.    Extraocular Movements: Extraocular movements intact.     Pupils: Pupils are equal, round, and reactive to light.  Musculoskeletal:        General: Normal range of motion.  Neurological:     Mental Status: She is alert and oriented to person, place, and time.       Assessment & Plan:   Diagnoses and all orders for this visit:  Acute suppurative otitis media of right ear without spontaneous rupture of tympanic membrane, recurrence not  specified  Acute bacterial conjunctivitis of both eyes  Other orders -     tobramycin-dexamethasone (TOBRADEX) ophthalmic solution; Apply 1 drop in affected eye(s) every 2 hours for two days. Then every 4 hours for 5 days. -     amoxicillin-clavulanate (AUGMENTIN) 875-125 MG tablet; Take 1 tablet by mouth 2 (two) times daily. Take all of this medication       I am having Sarah Rollins start on tobramycin-dexamethasone and amoxicillin-clavulanate. I am also having her maintain her Liletta (52 MG), sertraline, fluticasone, and sertraline.  Allergies as of 01/04/2022       Reactions   Ciprofloxacin Rash        Medication List        Accurate as of January 04, 2022  5:08 PM. If you have any questions, ask your nurse or doctor.          amoxicillin-clavulanate 875-125 MG tablet Commonly known as:  AUGMENTIN Take 1 tablet by mouth 2 (two) times daily. Take all of this medication Started by: Mechele Claude, MD   fluticasone 50 MCG/ACT nasal spray Commonly known as: FLONASE SPRAY 1 SPRAY INTO EACH NOSTRIL EVERY DAY   Liletta (52 MG) 20.1 MCG/DAY Iud IUD Generic drug: levonorgestrel 1 each by Intrauterine route once.   sertraline 100 MG tablet Commonly known as: ZOLOFT Take 1 tablet (100 mg total) by mouth daily.   sertraline 50 MG tablet Commonly known as: Zoloft Take 1 tablet (50 mg total) by mouth daily.   tobramycin-dexamethasone ophthalmic solution Commonly known as: TobraDex Apply 1 drop in affected eye(s) every 2 hours for two days. Then every 4 hours for 5 days. Started by: Mechele Claude, MD       Video Visit I discussed the limitations, risks, security and privacy concerns of performing an evaluation and management service by tvideo and the availability of in person appointments. I also discussed with the patient that there may be a patient responsible charge related to this service. The patient expressed understanding and agreed to proceed. Pt. Is at home. Dr. Darlyn Read is in his office.  Follow Up Instructions:   I discussed the assessment and treatment plan with the patient. The patient was provided an opportunity to ask questions and all were answered. The patient agreed with the plan and demonstrated an understanding of the instructions.   The patient was advised to call back or seek an in-person evaluation if the symptoms worsen or if the condition fails to improve as anticipated.  Total minutes including chart review and phone contact time: 12   Follow-up: Return if symptoms worsen or fail to improve.  Mechele Claude, M.D.

## 2022-01-14 ENCOUNTER — Other Ambulatory Visit: Payer: Self-pay | Admitting: Family Medicine

## 2022-01-14 DIAGNOSIS — O99345 Other mental disorders complicating the puerperium: Secondary | ICD-10-CM

## 2022-01-14 DIAGNOSIS — F53 Postpartum depression: Secondary | ICD-10-CM

## 2022-01-21 ENCOUNTER — Ambulatory Visit (INDEPENDENT_AMBULATORY_CARE_PROVIDER_SITE_OTHER): Payer: Medicaid Other | Admitting: Adult Health

## 2022-01-21 ENCOUNTER — Encounter: Payer: Self-pay | Admitting: Adult Health

## 2022-01-21 VITALS — BP 119/77 | HR 94 | Ht 61.0 in | Wt 158.5 lb

## 2022-01-21 DIAGNOSIS — Z3202 Encounter for pregnancy test, result negative: Secondary | ICD-10-CM

## 2022-01-21 DIAGNOSIS — Z30432 Encounter for removal of intrauterine contraceptive device: Secondary | ICD-10-CM | POA: Diagnosis not present

## 2022-01-21 DIAGNOSIS — Z30015 Encounter for initial prescription of vaginal ring hormonal contraceptive: Secondary | ICD-10-CM | POA: Diagnosis not present

## 2022-01-21 LAB — POCT URINE PREGNANCY: Preg Test, Ur: NEGATIVE

## 2022-01-21 MED ORDER — ETONOGESTREL-ETHINYL ESTRADIOL 0.12-0.015 MG/24HR VA RING: VAGINAL_RING | VAGINAL | 12 refills | Status: DC

## 2022-01-21 NOTE — Progress Notes (Signed)
  Subjective:     Patient ID: Maricsa Sammons, female   DOB: 2000/08/27, 21 y.o.   MRN: [id number]  HPI Ahsley is a 21 year old white female, with SO, G1P1 in requesting IUD be removed, can't feel strings and has bleeding after sex and pain.  Liletta was placed post placental delivery.She wants nuva ring has used in the past. Nevena was had [place], on day the [place] MD was killed. She sees therapist already.  PCP is Gilford Silvius NP. Lab Results  Component Value Date   DIAGPAP (A) 05/07/2020    - Atypical squamous cells of undetermined significance (ASC-US)   HPVHIGH Negative 05/07/2020    Review of Systems Can't feel IUD strings Bleeds after sex and has pain, wants IUD removed. Reviewed past medical,surgical, social and family history. Reviewed medications and allergies.     Objective:   Physical Exam BP 119/77 (BP Location: Left Arm, Patient Position: Sitting, Cuff Size: Normal)   Pulse 94   Ht 5\' 1"  (1.549 m)   Wt 158 lb 8 oz (71.9 kg)   LMP 01/05/2022   Breastfeeding No   BMI 29.95 kg/m  UPT is negative. Consent signed for IUD removal. Skin warm and dry.Pelvic: external genitalia is normal in appearance no lesions, vagina:pink,urethra has no lesions or masses noted, cervix:smooth and bulbous,no strings seen, used forceps to blind grasp strings and asked her to cough and IUD was easily removed, and strings were long, but curled in cervix, uterus: normal size, shape and contour, non tender, no masses felt, adnexa: no masses or tenderness noted. Bladder is non tender and no masses felt.   Fall risk is low  Upstream - 01/21/22 0856       Pregnancy Intention Screening   Does the patient want to become pregnant in the next year? Ok Either Way    Does the patient's partner want to become pregnant in the next year? Ok Either Way    Would the patient like to discuss contraceptive options today? Yes      Contraception Wrap Up   Current Method IUD or IUS    End Method Vaginal Ring             Examination chaperoned by 01/23/22 LPN  Assessment:     1. Pregnancy examination or test, negative result  2. Encounter for IUD removal  3. Encounter for initial prescription of vaginal ring hormonal contraceptive She wants nuva ring, has used in the past Can place today and use condoms for 1 month Meds ordered this encounter  Medications   etonogestrel-ethinyl estradiol (NUVARING) 0.12-0.015 MG/24HR vaginal ring    Sig: Insert vaginally and leave in place for 3 consecutive weeks, then remove for 1 week.    Dispense:  1 each    Refill:  12    Order Specific Question:   Supervising Provider    Answer:   07-03-1975 [[id number]]       Plan:     Follow up in 3 months for ROS

## 2022-02-05 ENCOUNTER — Other Ambulatory Visit: Payer: Self-pay | Admitting: Family Medicine

## 2022-02-05 DIAGNOSIS — F418 Other specified anxiety disorders: Secondary | ICD-10-CM

## 2022-02-05 DIAGNOSIS — F53 Postpartum depression: Secondary | ICD-10-CM

## 2022-02-10 ENCOUNTER — Other Ambulatory Visit: Payer: Self-pay | Admitting: Family Medicine

## 2022-03-11 ENCOUNTER — Other Ambulatory Visit: Payer: Self-pay | Admitting: Family Medicine

## 2022-03-29 ENCOUNTER — Telehealth: Payer: Self-pay

## 2022-03-29 NOTE — Telephone Encounter (Signed)
Transition Care Management Follow-up Telephone Call Date of discharge and from where: 03/29/22 [facility]  How have you been since you were released from the hospital? Patient states that she Is in a great deal of pain - flank pain 10/10 Any questions or concerns? No  Items Reviewed: Did the pt receive and understand the discharge instructions provided? Yes  Medications obtained and verified? Yes  Other? Yes  Any new allergies since your discharge? No  Dietary orders reviewed? Yes Do you have support at home? Yes   Home Care and Equipment/Supplies: Were home health services ordered? not applicable If so, what is the name of the agency? na  Has the agency set up a time to come to the patient's home? not applicable Were any new equipment or medical supplies ordered?  No What is the name of the medical supply agency? na Were you able to get the supplies/equipment? not applicable Do you have any questions related to the use of the equipment or supplies? No  Functional Questionnaire: (I = Independent and D = Dependent) ADLs: I  Bathing/Dressing- I  Meal Prep- I  Eating- I  Maintaining continence- I  Transferring/Ambulation- I  Managing Meds- I  Patient states do the above but it is very difficult.   Follow up appointments reviewed:  PCP Hospital f/u appt confirmed? Yes  Scheduled to see Rakes on 03/30/2022 @ 305pm. [facility] f/u appt confirmed?  NA Are transportation arrangements needed? No  If their condition worsens, is the pt aware to call PCP or go to the Emergency Dept.? Yes Was the patient provided with contact information for the PCP's office or ED? Yes Was to pt encouraged to call back with questions or concerns? Yes

## 2022-03-30 ENCOUNTER — Ambulatory Visit (INDEPENDENT_AMBULATORY_CARE_PROVIDER_SITE_OTHER): Payer: Medicaid Other | Admitting: Family Medicine

## 2022-03-30 ENCOUNTER — Encounter: Payer: Self-pay | Admitting: Family Medicine

## 2022-03-30 VITALS — BP 121/68 | HR 98 | Temp 97.7°F | Ht 61.0 in | Wt 157.0 lb

## 2022-03-30 DIAGNOSIS — N10 Acute tubulo-interstitial nephritis: Secondary | ICD-10-CM | POA: Diagnosis not present

## 2022-03-30 MED ORDER — CEFTRIAXONE SODIUM 1 G IJ SOLR
1.0000 g | Freq: Once | INTRAMUSCULAR | Status: AC
  Administered 2022-03-30: 1 g via INTRAMUSCULAR

## 2022-03-30 MED ORDER — SULFAMETHOXAZOLE-TRIMETHOPRIM 800-160 MG PO TABS: 1.0000 | ORAL_TABLET | Freq: Two times a day (BID) | ORAL | 0 refills | Status: AC

## 2022-03-30 MED ORDER — CEFTRIAXONE SODIUM 1 G IJ SOLR: 1.0000 g | Freq: Once | INTRAMUSCULAR | Status: DC

## 2022-03-30 NOTE — Patient Instructions (Signed)
Return tomorrow for 1 gm Rocephin IM Stop Keflex and start Bactrim

## 2022-03-30 NOTE — Progress Notes (Signed)
Subjective:  Patient ID: Sarah Rollins, female    DOB: Oct 16, 2000, 21 y.o.   MRN: [id number]  Patient Care Team: Sonny Masters, FNP as PCP - General (Family Medicine)   Chief Complaint:  ER follow up   HPI: Elen Acero is a 21 y.o. female presenting on 03/30/2022 for ER follow up   Pt was seen at [facility] ED yesterday and treated for acute pyelonephritis. She received 1 gm Rocephin in the ED and was discharged home on Keflex. States she continues to have flank pain and feels weak and tired. She is not febrile. No confusion, decreased urine output, or syncope. She has been staying hydrated. States aching to bilateral flanks with lower abdominal cramping. Motrin is slightly beneficial but has not resolved the pain.      Relevant past medical, surgical, family, and social history reviewed and updated as indicated.  Allergies and medications reviewed and updated. Data reviewed: Chart in Epic.   Past Medical History:  Diagnosis Date   ADHD     Past Surgical History:  Procedure Laterality Date   WISDOM TOOTH EXTRACTION      Social History   Socioeconomic History   Marital status: Significant Other    Spouse name: Not on file   Number of children: Not on file   Years of education: Not on file   Highest education level: Not on file  Occupational History   Not on file  Tobacco Use   Smoking status: Never   Smokeless tobacco: Never  Vaping Use   Vaping Use: Never used  Substance and Sexual Activity   Alcohol use: Not Currently   Drug use: Yes    Types: Marijuana    Comment: daily   Sexual activity: Yes    Birth control/protection: Inserts    Comment: intercourse age 57, more than 5 sexual partners  Other Topics Concern   Not on file  Social History Narrative   Not on file   Social Determinants of Health   Financial Resource Strain: Low Risk  (11/13/2020)   Overall Financial Resource Strain (CARDIA)    Difficulty of Paying Living Expenses: Not very hard   Food Insecurity: No Food Insecurity (11/13/2020)   Hunger Vital Sign    Worried About Running Out of Food in the Last Year: Never true    Ran Out of Food in the Last Year: Never true  Transportation Needs: No Transportation Needs (11/13/2020)   PRAPARE - Administrator, Civil Service (Medical): No    Lack of Transportation (Non-Medical): No  Physical Activity: Insufficiently Active (11/13/2020)   Exercise Vital Sign    Days of Exercise per Week: 3 days    Minutes of Exercise per Session: 30 min  Stress: Stress Concern Present (11/13/2020)   [employer] of Occupational Health - Occupational Stress Questionnaire    Feeling of Stress : Very much  Social Connections: Moderately Isolated (11/13/2020)   Social Connection and Isolation Panel [NHANES]    Frequency of Communication with Friends and Family: More than three times a week    Frequency of Social Gatherings with Friends and Family: More than three times a week    Attends Religious Services: Never    Database administrator or Organizations: No    Attends Banker Meetings: Never    Marital Status: Living with partner  Intimate Partner Violence: Not At Risk (11/13/2020)   Humiliation, Afraid, Rape, and Kick questionnaire    Fear of  Current or Ex-Partner: No    Emotionally Abused: No    Physically Abused: No    Sexually Abused: No    Outpatient Encounter Medications as of 03/30/2022  Medication Sig   busPIRone (BUSPAR) 7.5 MG tablet Take 7.5 mg by mouth 2 (two) times daily.   etonogestrel-ethinyl estradiol (NUVARING) 0.12-0.015 MG/24HR vaginal ring Insert vaginally and leave in place for 3 consecutive weeks, then remove for 1 week.   hydrOXYzine (VISTARIL) 50 MG capsule Take 2 capsules (100 mg total) by mouth 3 (three) times daily as needed.   sertraline (ZOLOFT) 100 MG tablet TAKE 1 TABLET (100 MG TOTAL) BY MOUTH DAILY. (NEEDS TO BE SEEN BEFORE NEXT REFILL)   sertraline (ZOLOFT) 50 MG tablet Take 1 tablet (50  mg total) by mouth daily.   sulfamethoxazole-trimethoprim (BACTRIM DS) 800-160 MG tablet Take 1 tablet by mouth 2 (two) times daily for 7 days.   Facility-Administered Encounter Medications as of 03/30/2022  Medication   [START ON 03/31/2022] cefTRIAXone (ROCEPHIN) injection 1 g   [COMPLETED] cefTRIAXone (ROCEPHIN) injection 1 g    Allergies  Allergen Reactions   Ciprofloxacin Rash    Review of Systems  Constitutional:  Positive for activity change, appetite change and fatigue. Negative for chills, diaphoresis, fever and unexpected weight change.  HENT: Negative.    Eyes: Negative.   Respiratory:  Negative for cough, chest tightness and shortness of breath.   Cardiovascular:  Negative for chest pain, palpitations and leg swelling.  Gastrointestinal:  Positive for abdominal pain. Negative for blood in stool, constipation, diarrhea, nausea and vomiting.  Endocrine: Negative.   Genitourinary:  Positive for flank pain. Negative for decreased urine volume, difficulty urinating, dyspareunia, dysuria, enuresis, frequency, genital sores, hematuria, menstrual problem, pelvic pain, urgency, vaginal bleeding, vaginal discharge and vaginal pain.  Musculoskeletal:  Positive for back pain. Negative for arthralgias and myalgias.  Skin: Negative.   Allergic/Immunologic: Negative.   Neurological:  Positive for weakness. Negative for dizziness, tremors, seizures, syncope, facial asymmetry, speech difficulty, light-headedness, numbness and headaches.  Hematological: Negative.   Psychiatric/Behavioral:  Negative for confusion, hallucinations, sleep disturbance and suicidal ideas.   All other systems reviewed and are negative.       Objective:  BP 121/68   Pulse 98   Temp 97.7 F (36.5 C)   Ht 5\' 1"  (1.549 m)   Wt 157 lb (71.2 kg)   SpO2 98%   BMI 29.66 kg/m    Wt Readings from Last 3 Encounters:  03/30/22 157 lb (71.2 kg)  01/21/22 158 lb 8 oz (71.9 kg)  09/18/21 163 lb (73.9 kg)     Physical Exam Vitals and nursing note reviewed.  Constitutional:      General: She is not in acute distress.    Appearance: Normal appearance. She is well-developed and well-groomed. She is ill-appearing. She is not toxic-appearing or diaphoretic.  HENT:     Head: Normocephalic and atraumatic.     Jaw: There is normal jaw occlusion.     Right Ear: Hearing normal.     Left Ear: Hearing normal.     Nose: Nose normal.     Mouth/Throat:     Lips: Pink.     Mouth: Mucous membranes are moist.     Pharynx: Oropharynx is clear. Uvula midline.  Eyes:     General: Lids are normal.     Extraocular Movements: Extraocular movements intact.     Conjunctiva/sclera: Conjunctivae normal.     Pupils: Pupils are equal, round, and reactive  to light.  Neck:     Thyroid: No thyroid mass, thyromegaly or thyroid tenderness.     Vascular: No carotid bruit or JVD.     Trachea: Trachea and phonation normal.  Cardiovascular:     Rate and Rhythm: Normal rate and regular rhythm.     Chest Wall: PMI is not displaced.     Pulses: Normal pulses.     Heart sounds: Normal heart sounds. No murmur heard.    No friction rub. No gallop.  Pulmonary:     Effort: Pulmonary effort is normal. No respiratory distress.     Breath sounds: Normal breath sounds. No wheezing.  Abdominal:     General: Bowel sounds are normal. There is no distension or abdominal bruit.     Palpations: Abdomen is soft. There is no hepatomegaly, splenomegaly or mass.     Tenderness: There is no abdominal tenderness. There is right CVA tenderness and left CVA tenderness. There is no guarding or rebound.     Hernia: No hernia is present.  Musculoskeletal:        General: Normal range of motion.     Cervical back: Normal range of motion and neck supple.     Right lower leg: No edema.     Left lower leg: No edema.  Lymphadenopathy:     Cervical: No cervical adenopathy.  Skin:    General: Skin is warm and dry.     Capillary Refill:  Capillary refill takes less than 2 seconds.     Coloration: Skin is not cyanotic, jaundiced or pale.     Findings: No rash.  Neurological:     General: No focal deficit present.     Mental Status: She is alert and oriented to person, place, and time.     Sensory: Sensation is intact.     Motor: Motor function is intact.     Coordination: Coordination is intact.     Gait: Gait is intact.     Deep Tendon Reflexes: Reflexes are normal and symmetric.  Psychiatric:        Attention and Perception: Attention and perception normal.        Mood and Affect: Mood and affect normal.        Speech: Speech normal.        Behavior: Behavior normal. Behavior is cooperative.        Thought Content: Thought content normal.        Cognition and Memory: Cognition and memory normal.        Judgment: Judgment normal.     Results for orders placed or performed in visit on 01/21/22  POCT urine pregnancy  Result Value Ref Range   Preg Test, Ur Negative Negative       Pertinent labs & imaging results that were available during my care of the patient were reviewed by me and considered in my medical decision making.  Assessment & Plan:  Nelle was seen today for er follow up.  Diagnoses and all orders for this visit:  Pyelonephritis, acute Well compensated and does not appear septic. Will dose with Rocephin 1 gm today and 1 gm Rocephin tomorrow. Change Keflex to bactrim to cover pyelonephritis. Pt aware of red flags which require emergent evaluation and possible admission to hospital.  -     sulfamethoxazole-trimethoprim (BACTRIM DS) 800-160 MG tablet; Take 1 tablet by mouth 2 (two) times daily for 7 days. -     cefTRIAXone (ROCEPHIN) injection 1 g -  cefTRIAXone (ROCEPHIN) injection 1 g     Continue all other maintenance medications.  Follow up plan: Return if symptoms worsen or fail to improve, for tomorrow with nurse for 1 gm rocephin IM.   Continue healthy lifestyle choices, including  diet (rich in fruits, vegetables, and lean proteins, and low in salt and simple carbohydrates) and exercise (at least 30 minutes of moderate physical activity daily).  Educational handout given for pyelonephritis   The above assessment and management plan was discussed with the patient. The patient verbalized understanding of and has agreed to the management plan. Patient is aware to call the clinic if they develop any new symptoms or if symptoms persist or worsen. Patient is aware when to return to the clinic for a follow-up visit. Patient educated on when it is appropriate to go to the emergency department.   Kari Baars, FNP-C [facility] [phone]

## 2022-03-31 ENCOUNTER — Ambulatory Visit (INDEPENDENT_AMBULATORY_CARE_PROVIDER_SITE_OTHER): Payer: Medicaid Other | Admitting: *Deleted

## 2022-03-31 DIAGNOSIS — N10 Acute tubulo-interstitial nephritis: Secondary | ICD-10-CM | POA: Diagnosis not present

## 2022-03-31 MED ORDER — CEFTRIAXONE SODIUM 1 G IJ SOLR
1.0000 g | Freq: Once | INTRAMUSCULAR | Status: AC
  Administered 2022-03-31: 1 g via INTRAMUSCULAR

## 2022-04-04 ENCOUNTER — Other Ambulatory Visit: Payer: Self-pay | Admitting: Family Medicine

## 2022-04-04 DIAGNOSIS — O99345 Other mental disorders complicating the puerperium: Secondary | ICD-10-CM

## 2022-04-05 ENCOUNTER — Other Ambulatory Visit: Payer: Self-pay | Admitting: Family Medicine

## 2022-04-05 DIAGNOSIS — F53 Postpartum depression: Secondary | ICD-10-CM

## 2022-04-05 DIAGNOSIS — O99345 Other mental disorders complicating the puerperium: Secondary | ICD-10-CM

## 2022-04-06 ENCOUNTER — Encounter: Payer: Self-pay | Admitting: Family Medicine

## 2022-04-06 NOTE — Telephone Encounter (Signed)
Letter sent.

## 2022-04-06 NOTE — Telephone Encounter (Signed)
Michelle NTBS 30 days given 02/05/22

## 2022-04-23 ENCOUNTER — Encounter: Payer: Self-pay | Admitting: Adult Health

## 2022-04-23 ENCOUNTER — Ambulatory Visit (INDEPENDENT_AMBULATORY_CARE_PROVIDER_SITE_OTHER): Payer: Medicaid Other | Admitting: Adult Health

## 2022-04-23 VITALS — BP 102/72 | HR 65 | Ht 61.0 in | Wt 161.0 lb

## 2022-04-23 DIAGNOSIS — F419 Anxiety disorder, unspecified: Secondary | ICD-10-CM

## 2022-04-23 DIAGNOSIS — Z3044 Encounter for surveillance of vaginal ring hormonal contraceptive device: Secondary | ICD-10-CM | POA: Insufficient documentation

## 2022-04-23 DIAGNOSIS — N898 Other specified noninflammatory disorders of vagina: Secondary | ICD-10-CM | POA: Insufficient documentation

## 2022-04-23 DIAGNOSIS — F32A Depression, unspecified: Secondary | ICD-10-CM | POA: Diagnosis not present

## 2022-04-23 MED ORDER — CLOTRIMAZOLE-BETAMETHASONE 1-0.05 % EX CREA: 1.0000 | TOPICAL_CREAM | Freq: Two times a day (BID) | CUTANEOUS | 1 refills | Status: DC

## 2022-04-23 MED ORDER — SERTRALINE HCL 100 MG PO TABS: ORAL_TABLET | ORAL | 6 refills | Status: DC

## 2022-04-23 MED ORDER — FLUCONAZOLE 150 MG PO TABS: ORAL_TABLET | ORAL | 1 refills | Status: DC

## 2022-04-23 NOTE — Progress Notes (Signed)
Subjective:     Patient ID: Sarah Rollins, female   DOB: 07-25-2000, 20 y.o.   MRN: [id number]  HPI Sarah Rollins is a 21 year old white female with SO, G1P1 in for follow up on nuvra ring and is happy with it, having itching in vaginal area and needs refill on Zoloft, thinks may need increasing.  Last pap was negative HPV ASCUS 05/07/20 PCP is Gilford Silvius NP  Review of Systems +itching vaginal area Happy with nuva ring Thinks Zoloft may need increasing Reviewed past medical,surgical, social and family history. Reviewed medications and allergies.     Objective:   Physical Exam BP 102/72 (BP Location: Left Arm, Patient Position: Sitting, Cuff Size: Normal)   Pulse 65   Ht 5\' 1"  (1.549 m)   Wt 161 lb (73 kg)   LMP 04/13/2022 (Exact Date)   Breastfeeding No   BMI 30.42 kg/m  Skin warm and dry.Pelvic: external genitalia is normal in appearance no lesions, has Desitin on, vagina:scant white discharge without odor,urethra has no lesions or masses noted, cervix:smooth and bulbous, uterus: normal size, shape and contour, non tender, no masses felt, adnexa: no masses or tenderness noted. Bladder is non tender and no masses felt. Fall risk is low    04/23/2022    9:57 AM 03/30/2022    3:29 PM 09/18/2021    2:24 PM  Depression screen PHQ 2/9  Decreased Interest 2 1 2   Down, Depressed, Hopeless 1 2 2   PHQ - 2 Score 3 3 4   Altered sleeping 3 1 2   Tired, decreased energy 2 2 2   Change in appetite 1 2 1   Feeling bad or failure about yourself  2 1 3   Trouble concentrating 2 0 3  Moving slowly or fidgety/restless 3 0 2  Suicidal thoughts 0 0 0  PHQ-9 Score 16 9 17   Difficult doing work/chores Somewhat difficult Somewhat difficult Very difficult           04/23/2022    9:59 AM 03/30/2022    3:30 PM 09/18/2021    2:24 PM 08/18/2021   12:12 PM  GAD 7 : Generalized Anxiety Score  Nervous, Anxious, on Edge 3 1 3 3   Control/stop worrying 3 1 2 2   Worry too much - different things 3 2 2 2   Trouble  relaxing 1 1 2 1   Restless 3 1 3 1   Easily annoyed or irritable 3 3 1 2   Afraid - awful might happen 2 1 2 3   Total GAD 7 Score 18 10 15 14   Anxiety Difficulty Somewhat difficult Somewhat difficult Not difficult at all Extremely difficult      Upstream - 04/23/22 0955       Pregnancy Intention Screening   Does the patient want to become pregnant in the next year? Ok Either Way    Does the patient's partner want to become pregnant in the next year? Ok Either Way    Would the patient like to discuss contraceptive options today? No      Contraception Wrap Up   Current Method Vaginal Ring    End Method Vaginal Ring    Contraception Counseling Provided No            Examination chaperoned by RN  Assessment:     1. Anxiety and depression Thinks Zoloft needs increasing, will increase to 200 mg daily  Has Buspar from psychologist  Meds ordered this encounter  Medications   sertraline (ZOLOFT) 100 MG tablet    Sig:  Take 2 daily    Dispense:  60 tablet    Refill:  6    Order Specific Question:   Supervising Provider    Answer:   Despina Hidden, LUTHER H [[id number]]   fluconazole (DIFLUCAN) 150 MG tablet    Sig: Take 1 now and 1 in 3 days    Dispense:  2 tablet    Refill:  1    Order Specific Question:   Supervising Provider    Answer:   Duane Lope H [[id number]]   clotrimazole-betamethasone (LOTRISONE) cream    Sig: Apply 1 Application topically 2 (two) times daily.    Dispense:  30 g    Refill:  1    Order Specific Question:   Supervising Provider    Answer:   Despina Hidden, LUTHER H [[id number]]     2. Itching in the vaginal area +itching Will rx diflucan  Will rx Lotrisone for groin area and vulva, do not put inside   3. Encounter for surveillance of vaginal ring hormonal contraceptive device Happy with nuva ring has refills     Plan:     Follow up in 3 months or sooner if needed

## 2022-04-26 IMAGING — US US SOFT TISSUE HEAD/NECK
1 series · 8 of 8 positions shown · non-contrast
Comparison: None.

CLINICAL DATA: Palpable lump near the mastoid.

EXAM:
ULTRASOUND OF HEAD/NECK SOFT TISSUES
TECHNIQUE: Ultrasound examination of the head and neck soft tissues was
performed in the area of clinical concern.

[Series 1: us soft tissue head & neck (non-thyroid) · 8 acquisitions, 8 frames shown]
[im 1/8]
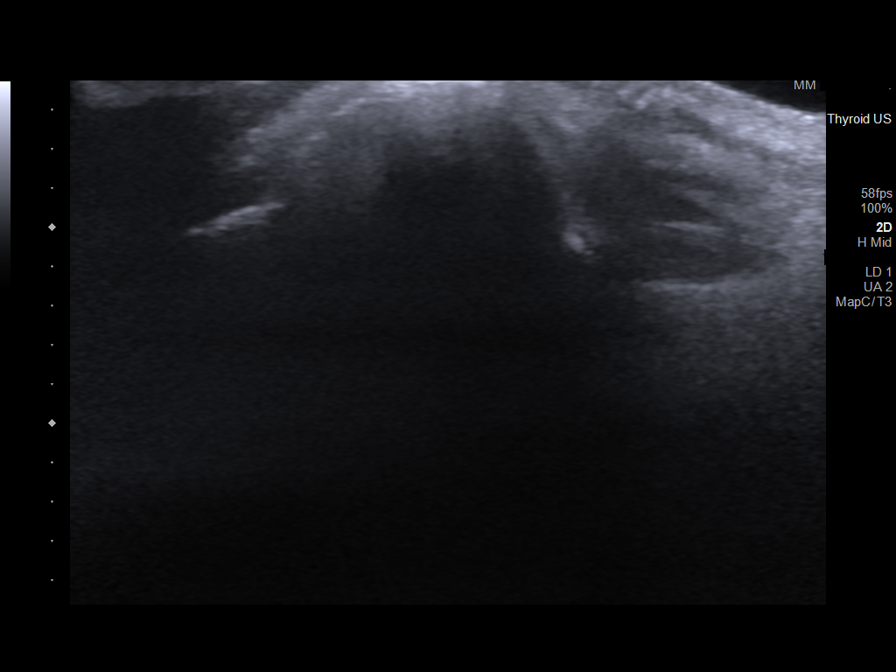
[im 2/8]
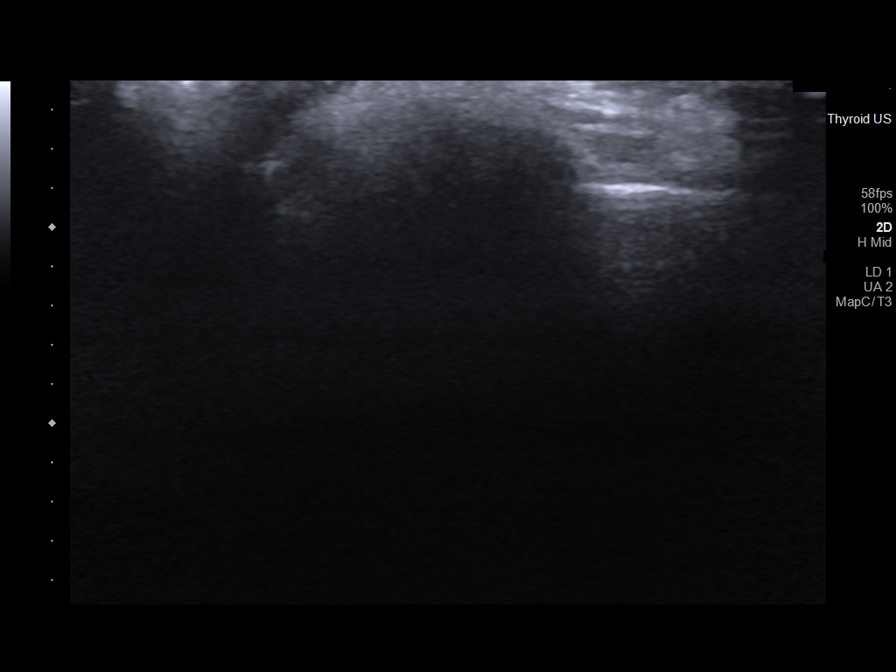
[im 3/8]
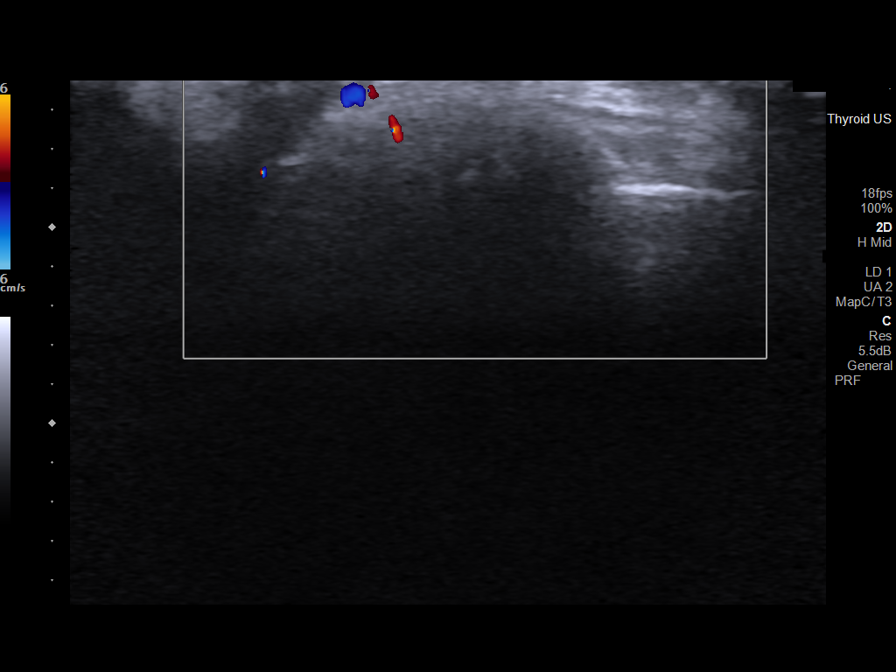
[im 4/8]
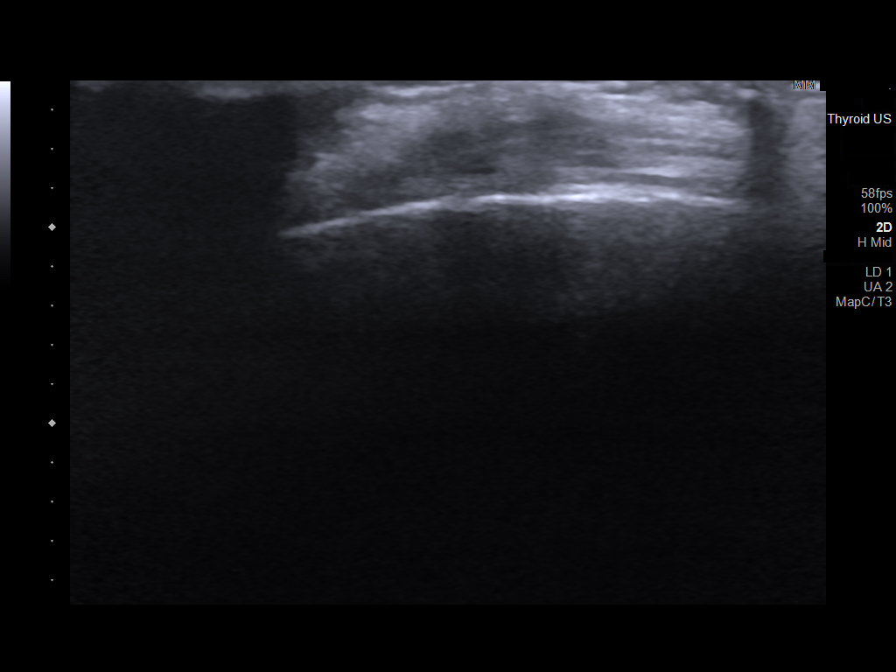
[im 5/8]
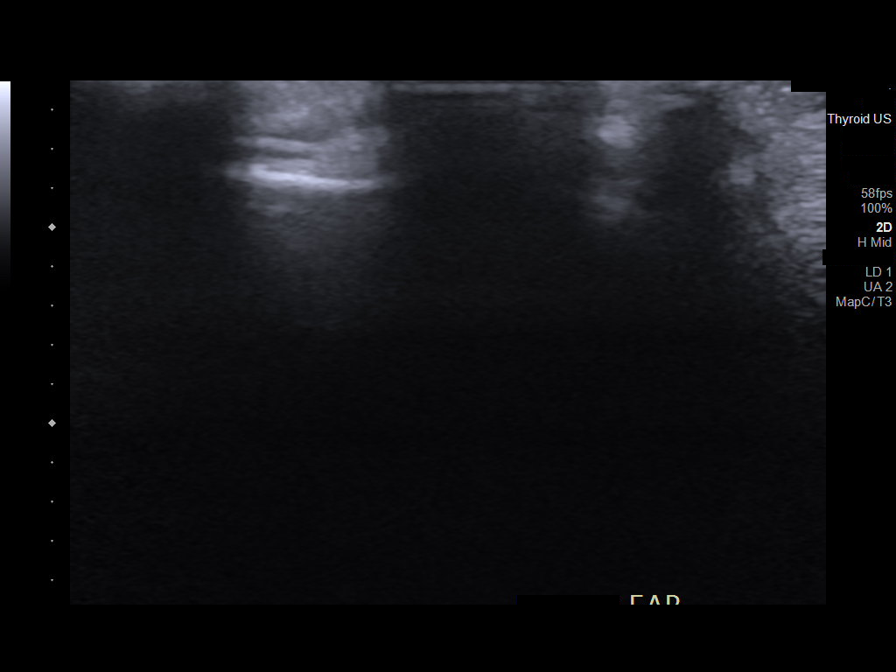
[im 6/8]
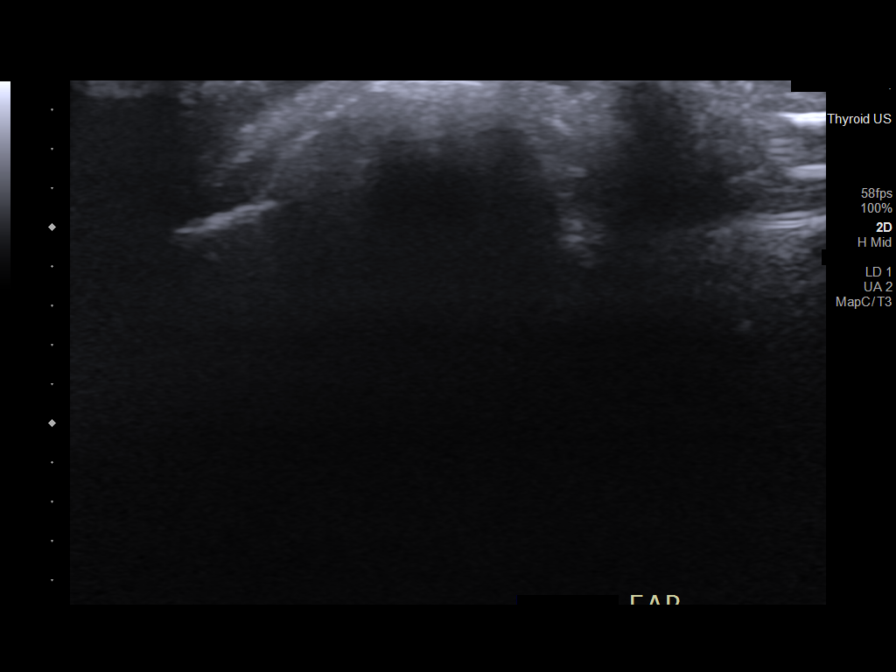
[im 7/8]
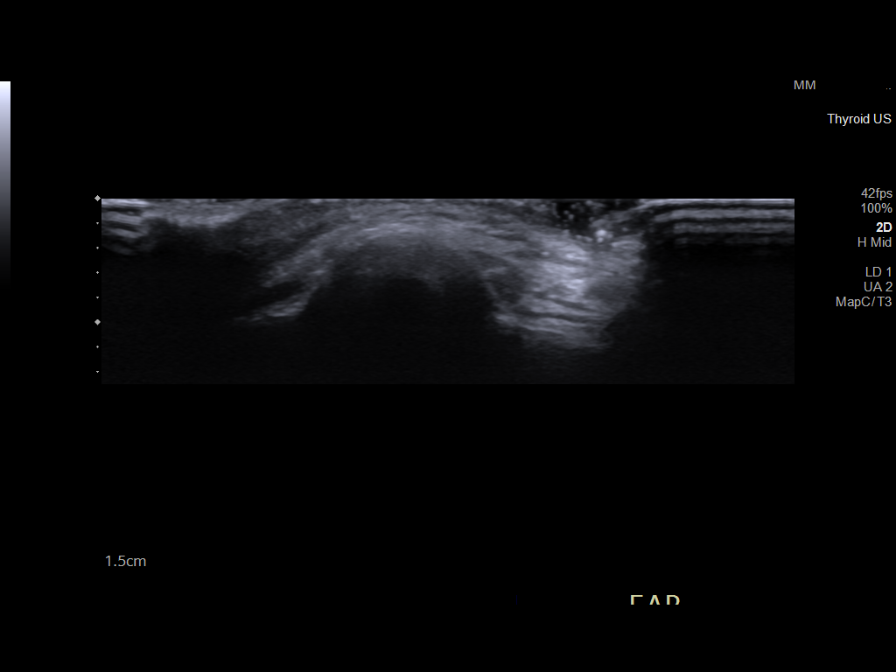
[im 8/8]
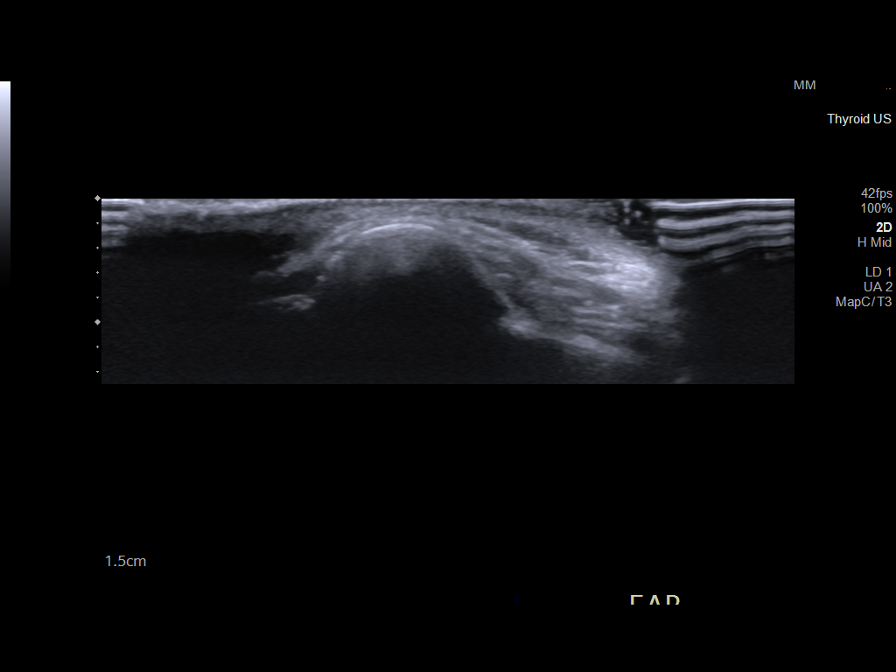

[8 of 8 positions shown; findings below may reference images not displayed]

FINDINGS: Focal shadowing adjacent to the right mastoid is consistent with the
known osteoma seen on prior head CT's. No other soft tissue
scratched at no other lesions are present.
IMPRESSION: 1. Osteoma associated with right mastoid. If there is concern for
growth, CT would be useful for comparison. The lesion appears benign
on previous imaging.

## 2022-05-14 ENCOUNTER — Ambulatory Visit: Payer: Medicaid Other | Admitting: Family Medicine

## 2022-05-17 ENCOUNTER — Encounter: Payer: Self-pay | Admitting: Family Medicine

## 2022-05-24 ENCOUNTER — Other Ambulatory Visit: Payer: Self-pay | Admitting: Adult Health

## 2022-07-23 ENCOUNTER — Ambulatory Visit (INDEPENDENT_AMBULATORY_CARE_PROVIDER_SITE_OTHER): Payer: Medicaid Other | Admitting: Adult Health

## 2022-07-23 ENCOUNTER — Encounter: Payer: Self-pay | Admitting: Adult Health

## 2022-07-23 ENCOUNTER — Other Ambulatory Visit (HOSPITAL_COMMUNITY)
Admission: RE | Admit: 2022-07-23 | Discharge: 2022-07-23 | Disposition: A | Discharge status: Home / Self Care | Payer: Medicaid Other | Source: Ambulatory Visit | Attending: Adult Health | Admitting: Adult Health

## 2022-07-23 VITALS — BP 117/83 | HR 69 | Ht 59.0 in | Wt 156.0 lb

## 2022-07-23 DIAGNOSIS — B369 Superficial mycosis, unspecified: Secondary | ICD-10-CM | POA: Diagnosis not present

## 2022-07-23 DIAGNOSIS — N6452 Nipple discharge: Secondary | ICD-10-CM | POA: Diagnosis present

## 2022-07-23 DIAGNOSIS — F419 Anxiety disorder, unspecified: Secondary | ICD-10-CM

## 2022-07-23 DIAGNOSIS — F32A Depression, unspecified: Secondary | ICD-10-CM | POA: Diagnosis not present

## 2022-07-23 MED ORDER — BUSPIRONE HCL 10 MG PO TABS: 10.0000 mg | ORAL_TABLET | Freq: Two times a day (BID) | ORAL | 3 refills | Status: DC

## 2022-07-23 MED ORDER — CLOTRIMAZOLE-BETAMETHASONE 1-0.05 % EX CREA: 1.0000 | TOPICAL_CREAM | Freq: Two times a day (BID) | CUTANEOUS | 1 refills | Status: DC

## 2022-07-23 MED ORDER — FLUCONAZOLE 150 MG PO TABS: ORAL_TABLET | ORAL | 1 refills | Status: DC

## 2022-07-23 NOTE — Progress Notes (Signed)
Subjective:     Patient ID: Sarah Rollins, female   DOB: [birth year], 22 y.o.   MRN: [id number]  HPI Learlean is a 22 year old white female,with SO, G1P1001, in for follow up on increasing Zoloft,and mentioned has bilateral breast discharge on and off, sees in bra. She is off Buspar, is changing to [employer] in [place] and is out of refills, is anxious. Still has some itching left groin area, sweats at night.  Last pap was negative HPV ASCUS 05/07/20.  PCP is Darla Lesches NP  Review of Systems +anxious +breast discharge +itching left groin +night sweats  Reviewed past medical,surgical, social and family history. Reviewed medications and allergies.     Objective:   Physical Exam BP 117/83 (BP Location: Left Arm, Patient Position: Sitting, Cuff Size: Normal)   Pulse 69   Ht 4' 11"$  (1.499 m)   Wt 156 lb (70.8 kg)   LMP 06/21/2022   Breastfeeding No   BMI 31.51 kg/m     Skin warm and dry.  Lungs: clear to ausculation bilaterally. Cardiovascular: regular rate and rhythm.    Breasts:no dominate palpable mass, retraction, has bilateral milky discahrge form both breasts, when pressed, slides obtained, had TSH and Prolactin drawn before exam. Has skin fungus left groin. Fall risk is low    07/23/2022    9:03 AM 04/23/2022    9:57 AM 03/30/2022    3:29 PM  Depression screen PHQ 2/9  Decreased Interest 0 2 1  Down, Depressed, Hopeless 1 1 2  $ PHQ - 2 Score 1 3 3  $ Altered sleeping 3 3 1  $ Tired, decreased energy 3 2 2  $ Change in appetite 1 1 2  $ Feeling bad or failure about yourself  2 2 1  $ Trouble concentrating 3 2 0  Moving slowly or fidgety/restless 3 3 0  Suicidal thoughts 0 0 0  PHQ-9 Score 16 16 9  $ Difficult doing work/chores Extremely dIfficult Somewhat difficult Somewhat difficult       07/23/2022    9:05 AM 04/23/2022    9:59 AM 03/30/2022    3:30 PM 09/18/2021    2:24 PM  GAD 7 : Generalized Anxiety Score  Nervous, Anxious, on Edge 3 3 1 3  $ Control/stop worrying 3 3 1 2   $ Worry too much - different things 3 3 2 2  $ Trouble relaxing 2 1 1 2  $ Restless 2 3 1 3  $ Easily annoyed or irritable 3 3 3 1  $ Afraid - awful might happen 2 2 1 2  $ Total GAD 7 Score 18 18 10 15  $ Anxiety Difficulty Very difficult Somewhat difficult Somewhat difficult Not difficult at all      Upstream - 07/23/22 0901       Pregnancy Intention Screening   Does the patient want to become pregnant in the next year? Ok Either Way    Does the patient's partner want to become pregnant in the next year? Ok Either Way    Would the patient like to discuss contraceptive options today? No      Contraception Wrap Up   Current Method No Method - Other Reason    End Method No Method - Other Reason             Assessment:     1. Breast discharge She sees discharge in bra on and off +milky discharge both breasts, slides obtained will send to cytology Prolactin and TSH drawn before exam - Prolactin - TSH  2. Anxiety and depression  Continue Zoloft 200 mg daily has refill  Will refill Buspar for her, till seen at [facility] next week  3. Superficial fungus infection of skin Sleep without panties Will rx diflucan and refill Lotrisone  Meds ordered this encounter  Medications   busPIRone (BUSPAR) 10 MG tablet    Sig: Take 1 tablet (10 mg total) by mouth 2 (two) times daily.    Dispense:  60 tablet    Refill:  3    Order Specific Question:   Supervising Provider    Answer:   Tania Ade H [[id number]]   clotrimazole-betamethasone (LOTRISONE) cream    Sig: Apply 1 Application topically 2 (two) times daily.    Dispense:  30 g    Refill:  1    Order Specific Question:   Supervising Provider    Answer:   Elonda Husky, LUTHER H [[id number]]   fluconazole (DIFLUCAN) 150 MG tablet    Sig: Take 1 now and 1 in 3 days    Dispense:  2 tablet    Refill:  1    Order Specific Question:   Supervising Provider    Answer:   Florian Buff [[id number]]       Plan:     Follow up in 4 weeks

## 2022-07-23 NOTE — Addendum Note (Signed)
Addended by: Linton Rump on: 07/23/2022 09:42 AM   Modules accepted: Orders

## 2022-07-24 LAB — TSH: TSH: 1.66 u[IU]/mL (ref 0.450–4.500)

## 2022-07-24 LAB — PROLACTIN: Prolactin: 4.7 ng/mL — ABNORMAL LOW (ref 4.8–33.4)

## 2022-07-28 LAB — CYTOLOGY - NON PAP

## 2022-08-16 ENCOUNTER — Other Ambulatory Visit: Payer: Self-pay | Admitting: Adult Health

## 2022-08-20 ENCOUNTER — Ambulatory Visit: Payer: Medicaid Other | Admitting: Adult Health

## 2023-01-03 ENCOUNTER — Ambulatory Visit
Admission: EM | Admit: 2023-01-03 | Discharge: 2023-01-03 | Disposition: A | Discharge status: Home / Self Care | Payer: Medicaid Other | Attending: Nurse Practitioner | Admitting: Nurse Practitioner

## 2023-01-03 DIAGNOSIS — R399 Unspecified symptoms and signs involving the genitourinary system: Secondary | ICD-10-CM | POA: Diagnosis present

## 2023-01-03 LAB — POCT URINALYSIS DIP (MANUAL ENTRY)
Bilirubin, UA: NEGATIVE
Glucose, UA: NEGATIVE mg/dL
Ketones, POC UA: NEGATIVE mg/dL
Nitrite, UA: NEGATIVE
Protein Ur, POC: 100 mg/dL — AB
Spec Grav, UA: >=1.03 — AB (ref 1.010–1.025)
Urobilinogen, UA: 1 E.U./dL
pH, UA: 7 (ref 5.0–8.0)

## 2023-01-03 MED ORDER — NITROFURANTOIN MONOHYD MACRO 100 MG PO CAPS: 100.0000 mg | ORAL_CAPSULE | Freq: Two times a day (BID) | ORAL | 0 refills | Status: DC

## 2023-01-03 NOTE — ED Provider Notes (Signed)
[facility]    CSN: [id number] Arrival date & time: 01/03/23  1606      History   Chief Complaint No chief complaint on file.   HPI Sarah Rollins is a 22 y.o. female.   The history is provided by the patient.   The patient presents for complaints of urinary frequency, burning with urination, and hematuria.  Patient also complains of low back pain.  And pain in her pelvic region.  Symptoms started today.  Patient denies fever, chills, chest pain, abdominal pain, nausea, vomiting, diarrhea, decreased urine stream, flank pain, vaginal discharge, vaginal odor, or vaginal itching.  Patient reports history of pyelonephritis.  Last UTI was more than 1 year ago. Past Medical History:  Diagnosis Date   ADHD     Patient Active Problem List   Diagnosis Date Noted   Breast discharge 07/23/2022   Superficial fungus infection of skin 07/23/2022   Itching in the vaginal area 04/23/2022   Anxiety and depression 04/23/2022   Encounter for surveillance of vaginal ring hormonal contraceptive device 04/23/2022   Encounter for initial prescription of vaginal ring hormonal contraceptive 01/21/2022   Encounter for IUD removal 01/21/2022   Pregnancy examination or test, negative result 01/21/2022   BMI 29.0-29.9,adult 08/04/2021   Postpartum anemia 08/04/2021   Postpartum anxiety 11/13/2020   Postpartum depression 11/13/2020   History of gestational hypertension 10/08/2020   Atypical squamous cell changes of undetermined significance (ASCUS) on cervical cytology with negative high risk human papilloma virus (HPV) test result 05/14/2020   Chlamydia 04/21/2020   Marijuana use 04/16/2020   Sinus tachycardia 05/29/2017   ADHD 06/12/2016    Past Surgical History:  Procedure Laterality Date   WISDOM TOOTH EXTRACTION      OB History     Gravida  1   Para  1   Term  1   Preterm  0   AB  0   Living  1      SAB  0   IAB  0   Ectopic  0   Multiple  0   Live Births   1            Home Medications    Prior to Admission medications   Medication Sig Start Date End Date Taking? Authorizing Provider  hydrOXYzine (VISTARIL) 25 MG capsule Take 25 mg by mouth 3 (three) times daily. 12/11/20  Yes [provider]  lamoTRIgine (LAMICTAL) 25 MG tablet Take 25 mg by mouth. 11/23/22  Yes [provider]  nitrofurantoin, macrocrystal-monohydrate, (MACROBID) 100 MG capsule Take 1 capsule (100 mg total) by mouth 2 (two) times daily. 01/03/23  Yes Lashunta Frieden-Warren, Sadie Haber, NP  prazosin (MINIPRESS) 1 MG capsule Take 1 mg by mouth 2 (two) times daily. 11/23/22  Yes [provider]  propranolol ER (INDERAL LA) 60 MG 24 hr capsule Take 60 mg by mouth at bedtime. 11/23/22  Yes [provider]  busPIRone (BUSPAR) 10 MG tablet TAKE 1 TABLET BY MOUTH TWICE A DAY 08/16/22   Adline Potter, NP  clotrimazole-betamethasone (LOTRISONE) cream Apply 1 Application topically 2 (two) times daily. 07/23/22   Adline Potter, NP  fluconazole (DIFLUCAN) 150 MG tablet Take 1 now and 1 in 3 days 07/23/22   Adline Potter, NP  sertraline (ZOLOFT) 100 MG tablet TAKE 2 EVERY DAY 05/24/22   Adline Potter, NP    Family History Family History  Problem Relation Age of Onset   Depression Sister  Anxiety disorder Sister    Hypertension Mother    Heart Problems Mother    Colon cancer Maternal Grandfather    Breast cancer Paternal Grandmother     Social History Social History   Tobacco Use   Smoking status: Never   Smokeless tobacco: Never  Vaping Use   Vaping status: Never Used  Substance Use Topics   Alcohol use: Not Currently   Drug use: Yes    Types: Marijuana    Comment: daily     Allergies   Ciprofloxacin   Review of Systems Review of Systems Per HPI  Physical Exam Triage Vital Signs ED Triage Vitals  Encounter Vitals Group     BP 01/03/23 1627 101/72     Systolic BP Percentile --      Diastolic BP Percentile --       Pulse Rate 01/03/23 1627 97     Resp 01/03/23 1627 12     Temp 01/03/23 1627 98.5 F (36.9 C)     Temp Source 01/03/23 1627 Oral     SpO2 01/03/23 1627 96 %     Weight --      Height --      Head Circumference --      Peak Flow --      Pain Score 01/03/23 1632 8     Pain Loc --      Pain Education --      Exclude from Growth Chart --    No data found.  Updated Vital Signs BP 101/72 (BP Location: Right Arm)   Pulse 97   Temp 98.5 F (36.9 C) (Oral)   Resp 12   LMP 12/07/2022 (Exact Date)   SpO2 96%   Visual Acuity Right Eye Distance:   Left Eye Distance:   Bilateral Distance:    Right Eye Near:   Left Eye Near:    Bilateral Near:     Physical Exam Vitals and nursing note reviewed.  Constitutional:      General: She is not in acute distress.    Appearance: Normal appearance.  HENT:     Head: Normocephalic.  Eyes:     Extraocular Movements: Extraocular movements intact.     Conjunctiva/sclera: Conjunctivae normal.     Pupils: Pupils are equal, round, and reactive to light.  Cardiovascular:     Rate and Rhythm: Normal rate and regular rhythm.     Pulses: Normal pulses.     Heart sounds: Normal heart sounds.  Pulmonary:     Effort: Pulmonary effort is normal.     Breath sounds: Normal breath sounds.  Abdominal:     General: Bowel sounds are normal. There is no distension.     Palpations: Abdomen is soft. There is no mass.     Tenderness: There is abdominal tenderness in the suprapubic area. There is no right CVA tenderness, left CVA tenderness, guarding or rebound.     Hernia: No hernia is present.  Musculoskeletal:     Cervical back: Normal range of motion.  Lymphadenopathy:     Cervical: No cervical adenopathy.  Skin:    General: Skin is warm and dry.  Neurological:     General: No focal deficit present.     Mental Status: She is alert and oriented to person, place, and time.  Psychiatric:        Mood and Affect: Mood normal.        Behavior:  Behavior normal.      UC Treatments /  Results  Labs (all labs ordered are listed, but only abnormal results are displayed) Labs Reviewed  POCT URINALYSIS DIP (MANUAL ENTRY) - Abnormal; Notable for the following components:      Result Value   Clarity, UA cloudy (*)    Spec Grav, UA >=1.030 (*)    Blood, UA large (*)    Protein Ur, POC =100 (*)    Leukocytes, UA Large (3+) (*)    All other components within normal limits  URINE CULTURE    EKG   Radiology No results found.  Procedures Procedures (including critical care time)  Medications Ordered in UC Medications - No data to display  Initial Impression / Assessment and Plan / UC Course  I have reviewed the triage vital signs and the nursing notes.  Pertinent labs & imaging results that were available during my care of the patient were reviewed by me and considered in my medical decision making (see chart for details).  The patient is well-appearing, she is in no acute distress, vital signs are stable.  Urinalysis is positive for leukocytes, protein, blood, elevated specific gravity, and cloudy.  Suggestive of a UTI.  Will treat empirically with Macrobid 100 mg twice daily for the next 5 days.  Urine culture is pending.  Supportive care recommendations were provided and discussed with the patient to include over-the-counter analgesics for pain or discomfort, increasing her water intake, voiding every 2 hours, and voiding 15 to 20 minutes after sexual intercourse.  Patient was advised regarding ER follow-up precautions.  Patient is in agreement with this plan of care and verbalizes understanding.  All questions were answered.  Patient stable for discharge.  Final Clinical Impressions(s) / UC Diagnoses   Final diagnoses:  UTI symptoms     Discharge Instructions      A urine culture is pending.  You will be contacted if the urine culture is negative and advised to stop the antibiotic.  If the the culture is negative and  you are continuing to experience symptoms, please follow-up with your primary care physician.  If the urine culture result is negative and indicates that your medication needs to be changed, you will also be contacted. Take medication as prescribed. Increase fluids and allow for plenty of rest.  Make sure you are drinking at least 10-12 8 ounce glasses of water daily while symptoms persist. May take over-the-counter Tylenol or ibuprofen as needed for pain, fever, or general discomfort. Make sure urinating every 2 hours while symptoms persist. If you are sexually active, make sure you are urinating at least 15 to 20 minutes after each sexual encounter. If you begin to experience fever, chills, pain around your kidneys, or worsening urinary symptoms, please go to the emergency department immediately for further evaluation. Follow-up as needed.     ED Prescriptions     Medication Sig Dispense Auth. Provider   nitrofurantoin, macrocrystal-monohydrate, (MACROBID) 100 MG capsule Take 1 capsule (100 mg total) by mouth 2 (two) times daily. 10 capsule Marshell Dilauro-Warren, Sadie Haber, NP      PDMP not reviewed this encounter.   Abran Cantor, NP 01/03/23 1707

## 2023-01-03 NOTE — Discharge Instructions (Addendum)
A urine culture is pending.  You will be contacted if the urine culture is negative and advised to stop the antibiotic.  If the the culture is negative and you are continuing to experience symptoms, please follow-up with your primary care physician.  If the urine culture result is negative and indicates that your medication needs to be changed, you will also be contacted. Take medication as prescribed. Increase fluids and allow for plenty of rest.  Make sure you are drinking at least 10-12 8 ounce glasses of water daily while symptoms persist. May take over-the-counter Tylenol or ibuprofen as needed for pain, fever, or general discomfort. Make sure urinating every 2 hours while symptoms persist. If you are sexually active, make sure you are urinating at least 15 to 20 minutes after each sexual encounter. If you begin to experience fever, chills, pain around your kidneys, or worsening urinary symptoms, please go to the emergency department immediately for further evaluation. Follow-up as needed.

## 2023-01-03 NOTE — ED Triage Notes (Signed)
Pt c/o UTI sx's burinig with urination, urinary frequency, blood in urine not on cycle.

## 2023-02-22 ENCOUNTER — Ambulatory Visit (INDEPENDENT_AMBULATORY_CARE_PROVIDER_SITE_OTHER): Payer: Medicaid Other | Admitting: *Deleted

## 2023-02-22 VITALS — BP 108/70 | HR 70 | Ht 59.0 in | Wt 160.0 lb

## 2023-02-22 DIAGNOSIS — Z3201 Encounter for pregnancy test, result positive: Secondary | ICD-10-CM | POA: Diagnosis not present

## 2023-02-22 DIAGNOSIS — Z789 Other specified health status: Secondary | ICD-10-CM

## 2023-02-22 DIAGNOSIS — N926 Irregular menstruation, unspecified: Secondary | ICD-10-CM | POA: Diagnosis not present

## 2023-02-22 LAB — POCT URINE PREGNANCY: Preg Test, Ur: POSITIVE — AB

## 2023-02-22 NOTE — Progress Notes (Signed)
   NURSE VISIT- PREGNANCY CONFIRMATION   SUBJECTIVE:  Sarah Rollins is a 22 y.o. G104P1001 female by uncertain LMP of No LMP recorded (lmp unknown). States her last documented period started 7/2 but had some spotting sometime in early August that last only a couple of days. Here for pregnancy confirmation.  Home pregnancy test: positive x 5  States her flow app says she should be [redacted]w[redacted]d but is unsure how she is getting that on the appt She reports cramping.  She is taking prenatal vitamins.  Has stopped all her psych meds as she was unsure what she could take.   OBJECTIVE:  BP 108/70 (BP Location: Right Arm, Patient Position: Sitting, Cuff Size: Normal)   Pulse 70   Ht 4\' 11"  (1.499 m)   Wt 160 lb (72.6 kg)   LMP  (LMP Unknown)   BMI 32.32 kg/m   Appears well, in no apparent distress  Results for orders placed or performed in visit on 02/22/23 (from the past 24 hour(s))  POCT urine pregnancy   Collection Time: 02/22/23 11:38 AM  Result Value Ref Range   Preg Test, Ur Positive (A) Negative    ASSESSMENT: Positive pregnancy test, Unknown by LMP    PLAN: HCG today Prenatal vitamins: continue   Nausea medicines: not currently needed   OB packet given: Yes Appt with psychologist tomorrow to discuss psych meds   Jobe Marker  02/22/2023 11:40 AM

## 2023-03-15 ENCOUNTER — Other Ambulatory Visit: Payer: Self-pay | Admitting: Obstetrics & Gynecology

## 2023-03-15 DIAGNOSIS — O3680X0 Pregnancy with inconclusive fetal viability, not applicable or unspecified: Secondary | ICD-10-CM

## 2023-03-15 DIAGNOSIS — Z3201 Encounter for pregnancy test, result positive: Secondary | ICD-10-CM

## 2023-03-16 ENCOUNTER — Encounter: Payer: Self-pay | Admitting: Radiology

## 2023-03-16 ENCOUNTER — Other Ambulatory Visit: Payer: Medicaid Other | Admitting: Radiology

## 2023-03-16 DIAGNOSIS — Z3201 Encounter for pregnancy test, result positive: Secondary | ICD-10-CM | POA: Diagnosis not present

## 2023-03-16 DIAGNOSIS — O3680X0 Pregnancy with inconclusive fetal viability, not applicable or unspecified: Secondary | ICD-10-CM

## 2023-03-16 DIAGNOSIS — Z3A01 Less than 8 weeks gestation of pregnancy: Secondary | ICD-10-CM

## 2023-03-16 NOTE — Progress Notes (Unsigned)
Patient here for dating ultrasound and is requesting nausea medication.   Advised to check with pharmacy later today.

## 2023-03-16 NOTE — Progress Notes (Signed)
Unknown LMP  Hx multiple SAB's Single viable IUP = 107w4d   FHR 158bpm Normal YS    CRL = 13.25mm    EDD = 10-29-23 Normal ovaries bilaterally  -  neg adnexal regions  -  neg CDS - No Free Fluid

## 2023-03-21 ENCOUNTER — Other Ambulatory Visit: Payer: Self-pay

## 2023-03-21 ENCOUNTER — Inpatient Hospital Stay (HOSPITAL_COMMUNITY)
Admission: AD | Admit: 2023-03-21 | Discharge: 2023-03-21 | Disposition: A | Discharge status: Home / Self Care | Payer: Medicaid Other | Attending: Obstetrics & Gynecology | Admitting: Obstetrics & Gynecology

## 2023-03-21 ENCOUNTER — Encounter (HOSPITAL_COMMUNITY): Payer: Self-pay | Admitting: Obstetrics & Gynecology

## 2023-03-21 DIAGNOSIS — Z3A08 8 weeks gestation of pregnancy: Secondary | ICD-10-CM | POA: Insufficient documentation

## 2023-03-21 DIAGNOSIS — O210 Mild hyperemesis gravidarum: Secondary | ICD-10-CM | POA: Insufficient documentation

## 2023-03-21 DIAGNOSIS — O26891 Other specified pregnancy related conditions, first trimester: Secondary | ICD-10-CM | POA: Diagnosis not present

## 2023-03-21 LAB — URINALYSIS, ROUTINE W REFLEX MICROSCOPIC
Bilirubin Urine: NEGATIVE
Glucose, UA: NEGATIVE mg/dL
Hgb urine dipstick: NEGATIVE
Ketones, ur: 20 mg/dL — AB
Nitrite: NEGATIVE
Protein, ur: NEGATIVE mg/dL
Specific Gravity, Urine: 1.016 (ref 1.005–1.030)
pH: 6 (ref 5.0–8.0)

## 2023-03-21 MED ORDER — ONDANSETRON 4 MG PO TBDP: 4.0000 mg | ORAL_TABLET | Freq: Three times a day (TID) | ORAL | 0 refills | Status: DC | PRN

## 2023-03-21 MED ORDER — FAMOTIDINE 20 MG PO TABS: 20.0000 mg | ORAL_TABLET | Freq: Two times a day (BID) | ORAL | 0 refills | Status: DC

## 2023-03-21 MED ORDER — ONDANSETRON 4 MG PO TBDP
8.0000 mg | ORAL_TABLET | Freq: Once | ORAL | Status: AC
  Administered 2023-03-21: 8 mg via ORAL
  Filled 2023-03-21: qty 2

## 2023-03-21 MED ORDER — VITAMIN B-6 50 MG PO TABS: 50.0000 mg | ORAL_TABLET | Freq: Two times a day (BID) | ORAL | 0 refills | Status: DC

## 2023-03-21 MED ORDER — DOXYLAMINE SUCCINATE (SLEEP) 25 MG PO TABS: 25.0000 mg | ORAL_TABLET | Freq: Two times a day (BID) | ORAL | 0 refills | Status: DC

## 2023-03-21 NOTE — MAU Provider Note (Signed)
Chief Complaint: Nausea and Emesis  Provider at bedside [phone]    SUBJECTIVE HPI: Tunya Held is a 22 y.o. G2P1001 at [redacted]w[redacted]d by early ultrasound who presents to maternity admissions reporting nausea/vomiting x3 days.  Able to keep down some fluids but not much in way of solids. Vomiting bile. Feels throat is irritated/acid reflux symptoms. Still urinating several times per day. No blood in vomit. Did have morning sickness with first pregnancy as well. No meds at home.  Denies VB, cramping, LOF, change in vaginal discharge, urinary symptoms.  Had confirmed IUP on U/S 03/16/23.  HPI  Past Medical History:  Diagnosis Date   ADHD    Past Surgical History:  Procedure Laterality Date   WISDOM TOOTH EXTRACTION     Social History   Socioeconomic History   Marital status: Significant Other    Spouse name: Not on file   Number of children: Not on file   Years of education: Not on file   Highest education level: Not on file  Occupational History   Not on file  Tobacco Use   Smoking status: Never   Smokeless tobacco: Never  Vaping Use   Vaping status: Never Used  Substance and Sexual Activity   Alcohol use: Not Currently   Drug use: Yes    Types: Marijuana    Comment: daily   Sexual activity: Yes    Birth control/protection: None  Other Topics Concern   Not on file  Social History Narrative   Not on file   Social Determinants of Health   Financial Resource Strain: Low Risk  (11/13/2020)   Overall Financial Resource Strain (CARDIA)    Difficulty of Paying Living Expenses: Not very hard  Food Insecurity: No Food Insecurity (11/13/2020)   Hunger Vital Sign    Worried About Running Out of Food in the Last Year: Never true    Ran Out of Food in the Last Year: Never true  Transportation Needs: No Transportation Needs (11/13/2020)   PRAPARE - Administrator, Civil Service (Medical): No    Lack of Transportation (Non-Medical): No  Physical Activity: Insufficiently Active  (11/13/2020)   Exercise Vital Sign    Days of Exercise per Week: 3 days    Minutes of Exercise per Session: 30 min  Stress: Stress Concern Present (11/13/2020)   [employer] of Occupational Health - Occupational Stress Questionnaire    Feeling of Stress : Very much  Social Connections: Moderately Isolated (11/13/2020)   Social Connection and Isolation Panel [NHANES]    Frequency of Communication with Friends and Family: More than three times a week    Frequency of Social Gatherings with Friends and Family: More than three times a week    Attends Religious Services: Never    Database administrator or Organizations: No    Attends Banker Meetings: Never    Marital Status: Living with partner  Intimate Partner Violence: Not At Risk (11/13/2020)   Humiliation, Afraid, Rape, and Kick questionnaire    Fear of Current or Ex-Partner: No    Emotionally Abused: No    Physically Abused: No    Sexually Abused: No   No current facility-administered medications on file prior to encounter.   Current Outpatient Medications on File Prior to Encounter  Medication Sig Dispense Refill   busPIRone (BUSPAR) 10 MG tablet TAKE 1 TABLET BY MOUTH TWICE A DAY 180 tablet 2   clotrimazole-betamethasone (LOTRISONE) cream Apply 1 Application topically 2 (two) times daily. (Patient  not taking: Reported on 02/22/2023) 30 g 1   hydrOXYzine (VISTARIL) 25 MG capsule Take 25 mg by mouth 3 (three) times daily. (Patient not taking: Reported on 02/22/2023)     lamoTRIgine (LAMICTAL) 25 MG tablet Take 25 mg by mouth. (Patient not taking: Reported on 02/22/2023)     prazosin (MINIPRESS) 1 MG capsule Take 1 mg by mouth 2 (two) times daily. (Patient not taking: Reported on 02/22/2023)     Prenatal Vit-Fe Fumarate-FA (PRENATAL VITAMIN PO) Take by mouth.     propranolol ER (INDERAL LA) 60 MG 24 hr capsule Take 60 mg by mouth at bedtime. (Patient not taking: Reported on 02/22/2023)     sertraline (ZOLOFT) 100 MG tablet TAKE  2 EVERY DAY 180 tablet 3   Allergies  Allergen Reactions   Ciprofloxacin Rash    ROS:  Pertinent positives/negatives listed above.  I have reviewed patient's Past Medical Hx, Surgical Hx, Family Hx, Social Hx, medications and allergies.   Physical Exam  Patient Vitals for the past 24 hrs:  BP Temp Temp src Pulse Resp SpO2 Height Weight  03/21/23 0855 (!) 126/91 97.7 F (36.5 C) Oral (!) 116 18 96 % -- --  03/21/23 0850 -- -- -- -- -- -- 5' (1.524 m) 69 kg   Constitutional: Well-developed, well-nourished female in no acute distress. MMM  Cardiovascular: normal rate Respiratory: normal effort GI: Abd soft, non-tender. Pos BS x 4 MS: Extremities nontender, no edema, normal ROM Neurologic: Alert and oriented x 4  LAB RESULTS Results for orders placed or performed during the hospital encounter of 03/21/23 (from the past 24 hour(s))  Urinalysis, Routine w reflex microscopic -Urine, Clean Catch     Status: Abnormal   Collection Time: 03/21/23  9:10 AM  Result Value Ref Range   Color, Urine YELLOW YELLOW   APPearance HAZY (A) CLEAR   Specific Gravity, Urine 1.016 1.005 - 1.030   pH 6.0 5.0 - 8.0   Glucose, UA NEGATIVE NEGATIVE mg/dL   Hgb urine dipstick NEGATIVE NEGATIVE   Bilirubin Urine NEGATIVE NEGATIVE   Ketones, ur 20 (A) NEGATIVE mg/dL   Protein, ur NEGATIVE NEGATIVE mg/dL   Nitrite NEGATIVE NEGATIVE   Leukocytes,Ua MODERATE (A) NEGATIVE   RBC / HPF 0-5 0 - 5 RBC/hpf   WBC, UA 11-20 0 - 5 WBC/hpf   Bacteria, UA RARE (A) NONE SEEN   Squamous Epithelial / HPF 11-20 0 - 5 /HPF   Mucus PRESENT       IMAGING US OB Comp Less 14 Wks  Result Date: 03/16/2023 Table formatting from the original result was not included. Images from the original result were not included.  ..an Financial trader of Ultrasound Medicine Technical sales engineer) accredited practice [facility] [address] [place] [address] Ordering Provider: Myna Hidalgo, DO                                                                                      DATING AND VIABILITY SONOGRAM Anjolaoluwa Siguenza is a 22 y.o. year old G2P1001 with unknown LMP.   Hx multiple SAB's. She has irregular menstrual cycles.   She is  here today for a confirmatory initial sonogram, Dating, viability. GESTATION: SINGLETON   FETAL ACTIVITY:          Heart rate         158 bpm         CERVIX: Appears closed ADNEXA: The ovaries are normal - Neg Adnexal regions GESTATIONAL AGE AND  BIOMETRICS: Gestational criteria: Estimated Date of Delivery: unknown LMP Previous Scans:0    CROWN RUMP LENGTH           13.1 mm         [redacted]w[redacted]d weeks                                                                      AVERAGE EGA(BY THIS SCAN):  7+4 weeks WORKING EDD( early ultrasound ):  10-29-23  TECHNICIAN COMMENTS: Unknown LMP  Hx multiple SAB's Single viable IUP = [redacted]w[redacted]d   FHR 158bpm Normal YS    CRL = 13.21mm    EDD = 10-29-23 Normal ovaries bilaterally  -  neg adnexal regions  -  neg CDS - No Free Fluid A copy of this report including all images has been saved and backed up to a second source for retrieval if needed. All measures and details of the anatomical scan, placentation, fluid volume and pelvic anatomy are contained in that report. Evlyn Kanner Nance 03/16/2023 9:00 AM Clinical Impression and recommendations: I have reviewed the sonogram results above, combined with the patient's current clinical course, below are my impressions and any appropriate recommendations for management based on the sonographic findings. Viable early IUP G2P1001 Estimated Date of Delivery: 10/29/23 early ultrasound Normal general sonographic findings Recommend routine care unless otherwise clinically indicated Sharon Seller    MAU Management/MDM: Orders Placed This Encounter  Procedures   Urinalysis, Routine w reflex microscopic -Urine, Clean Catch   Discharge patient    Meds ordered this encounter  Medications   ondansetron (ZOFRAN-ODT) disintegrating tablet  8 mg   ondansetron (ZOFRAN-ODT) 4 MG disintegrating tablet    Sig: Take 1 tablet (4 mg total) by mouth every 8 (eight) hours as needed for nausea or vomiting.    Dispense:  30 tablet    Refill:  0   famotidine (PEPCID) 20 MG tablet    Sig: Take 1 tablet (20 mg total) by mouth 2 (two) times daily.    Dispense:  60 tablet    Refill:  0   pyridOXINE (VITAMIN B6) 50 MG tablet    Sig: Take 1 tablet (50 mg total) by mouth 2 (two) times daily.    Dispense:  60 tablet    Refill:  0   doxylamine, Sleep, (UNISOM) 25 MG tablet    Sig: Take 1 tablet (25 mg total) by mouth 2 (two) times daily.    Dispense:  60 tablet    Refill:  0    Patient presents for morning sickness consistent with 8 weeks of pregnancy. Given Zofran ODT and PO hydration. Spec grav reassuringly normal, and mild ketones on UA. Patient able to keep down water and crackers prior to discharge. Able to PO hydrate, so IVF deferred.  Discussed plan for N/V going forward. B6-unisom BID for prophylaxis, famotidine for reflux, and zofran for breakthrough symptoms. Had  follow-up with primary OB scheduled.  ASSESSMENT 1. Morning sickness   2. [redacted] weeks gestation of pregnancy     PLAN Discharge home with strict return precautions. Allergies as of 03/21/2023       Reactions   Ciprofloxacin Rash        Medication List     STOP taking these medications    clotrimazole-betamethasone cream Commonly known as: Lotrisone   hydrOXYzine 25 MG capsule Commonly known as: VISTARIL   lamoTRIgine 25 MG tablet Commonly known as: LAMICTAL   prazosin 1 MG capsule Commonly known as: MINIPRESS   propranolol ER 60 MG 24 hr capsule Commonly known as: INDERAL LA       TAKE these medications    busPIRone 10 MG tablet Commonly known as: BUSPAR TAKE 1 TABLET BY MOUTH TWICE A DAY   doxylamine (Sleep) 25 MG tablet Commonly known as: UNISOM Take 1 tablet (25 mg total) by mouth 2 (two) times daily.   famotidine 20 MG tablet Commonly  known as: Pepcid Take 1 tablet (20 mg total) by mouth 2 (two) times daily.   ondansetron 4 MG disintegrating tablet Commonly known as: ZOFRAN-ODT Take 1 tablet (4 mg total) by mouth every 8 (eight) hours as needed for nausea or vomiting.   PRENATAL VITAMIN PO Take by mouth.   pyridOXINE 50 MG tablet Commonly known as: VITAMIN B6 Take 1 tablet (50 mg total) by mouth 2 (two) times daily.   sertraline 100 MG tablet Commonly known as: ZOLOFT TAKE 2 EVERY DAY         Wylene Simmer, MD OB Fellow 03/21/2023  9:53 AM

## 2023-03-21 NOTE — MAU Note (Signed)
Sarah Rollins is a 22 y.o. at [redacted]w[redacted]d here in MAU reporting: "extreme nausea", reports can't keep anything down for past 3 days.  Reports doesn't have any meds prescribed for nausea.  Denies VB. LMP: NA Onset of complaint: 3 days  Pain score: 0 Vitals:   03/21/23 0855  BP: (!) 126/91  Pulse: (!) 116  Resp: 18  Temp: 97.7 F (36.5 C)  SpO2: 96%     FHT:NA Lab orders placed from triage:   UA

## 2023-04-15 ENCOUNTER — Other Ambulatory Visit: Payer: Self-pay | Admitting: Obstetrics & Gynecology

## 2023-04-15 DIAGNOSIS — Z3682 Encounter for antenatal screening for nuchal translucency: Secondary | ICD-10-CM

## 2023-04-18 ENCOUNTER — Other Ambulatory Visit: Payer: Medicaid Other

## 2023-04-18 ENCOUNTER — Encounter: Payer: Medicaid Other | Admitting: Advanced Practice Midwife

## 2023-04-22 ENCOUNTER — Inpatient Hospital Stay (HOSPITAL_COMMUNITY)
Admission: AD | Admit: 2023-04-22 | Discharge: 2023-04-22 | Disposition: A | Discharge status: Home / Self Care | Payer: Medicaid Other | Attending: Obstetrics and Gynecology | Admitting: Obstetrics and Gynecology

## 2023-04-22 DIAGNOSIS — O99321 Drug use complicating pregnancy, first trimester: Secondary | ICD-10-CM | POA: Insufficient documentation

## 2023-04-22 DIAGNOSIS — O219 Vomiting of pregnancy, unspecified: Secondary | ICD-10-CM | POA: Diagnosis not present

## 2023-04-22 DIAGNOSIS — O26891 Other specified pregnancy related conditions, first trimester: Secondary | ICD-10-CM | POA: Diagnosis not present

## 2023-04-22 DIAGNOSIS — Z3A12 12 weeks gestation of pregnancy: Secondary | ICD-10-CM | POA: Diagnosis not present

## 2023-04-22 LAB — URINALYSIS, ROUTINE W REFLEX MICROSCOPIC
Bilirubin Urine: NEGATIVE
Glucose, UA: NEGATIVE mg/dL
Hgb urine dipstick: NEGATIVE
Ketones, ur: NEGATIVE mg/dL
Nitrite: NEGATIVE
Protein, ur: NEGATIVE mg/dL
Specific Gravity, Urine: 1.025 (ref 1.005–1.030)
pH: 5 (ref 5.0–8.0)

## 2023-04-22 MED ORDER — ONDANSETRON 4 MG PO TBDP
4.0000 mg | ORAL_TABLET | Freq: Three times a day (TID) | ORAL | 1 refills | Status: DC | PRN
Start: 2023-04-22 — End: 2023-05-16

## 2023-04-22 MED ORDER — METOCLOPRAMIDE HCL 10 MG PO TABS: 10.0000 mg | ORAL_TABLET | Freq: Three times a day (TID) | ORAL | 1 refills | Status: DC | PRN

## 2023-04-22 MED ORDER — ONDANSETRON 4 MG PO TBDP
8.0000 mg | ORAL_TABLET | Freq: Once | ORAL | Status: AC
  Administered 2023-04-22: 8 mg via ORAL
  Filled 2023-04-22: qty 2

## 2023-04-22 NOTE — MAU Provider Note (Signed)
History     CSN: [id number]  Arrival date and time: 04/22/23 1404   Event Date/Time   First Provider Initiated Contact with Patient 04/22/23 1445      Chief Complaint  Patient presents with   Abdominal Pain   Emesis   Nausea   HPI Sarah Rollins is a 22 y.o. G2P1001 at [redacted]w[redacted]d who presents for n/v. This has been an ongoing issue with the pregnancy. Reports vomiting 6 times per day. Was using zofran until she ran out of it. States zofran works well for her & she would like to continued using it. Reports some lower abdominal cramping. Denies vaginal bleeding, discharge, or dysuria. Does not have prenatal care yet.   OB History     Gravida  2   Para  1   Term  1   Preterm  0   AB  0   Living  1      SAB  0   IAB  0   Ectopic  0   Multiple  0   Live Births  1           Past Medical History:  Diagnosis Date   ADHD     Past Surgical History:  Procedure Laterality Date   WISDOM TOOTH EXTRACTION      Family History  Problem Relation Age of Onset   Depression Sister    Anxiety disorder Sister    Hypertension Mother    Heart Problems Mother    Colon cancer Maternal Grandfather    Breast cancer Paternal Grandmother     Social History   Tobacco Use   Smoking status: Never   Smokeless tobacco: Never  Vaping Use   Vaping status: Never Used  Substance Use Topics   Alcohol use: Not Currently   Drug use: Yes    Types: Marijuana    Comment: daily    Allergies:  Allergies  Allergen Reactions   Ciprofloxacin Rash    No medications prior to admission.    Review of Systems Physical Exam   Blood pressure 114/66, pulse 78, temperature 98.1 F (36.7 C), resp. rate 16, height 5' (1.524 m), weight 68.7 kg, SpO2 99%.  Physical Exam Vitals and nursing note reviewed.  Constitutional:      General: She is not in acute distress.    Appearance: She is well-developed. She is not ill-appearing.  HENT:     Head: Normocephalic and atraumatic.  Eyes:      General: No scleral icterus.       Right eye: No discharge.        Left eye: No discharge.     Conjunctiva/sclera: Conjunctivae normal.  Pulmonary:     Effort: Pulmonary effort is normal. No respiratory distress.  Neurological:     General: No focal deficit present.     Mental Status: She is alert.  Psychiatric:        Mood and Affect: Mood normal.        Behavior: Behavior normal.     MAU Course  Procedures Results for orders placed or performed during the hospital encounter of 04/22/23 (from the past 24 hour(s))  Urinalysis, Routine w reflex microscopic -Urine, Clean Catch     Status: Abnormal   Collection Time: 04/22/23  2:47 PM  Result Value Ref Range   Color, Urine YELLOW YELLOW   APPearance CLOUDY (A) CLEAR   Specific Gravity, Urine 1.025 1.005 - 1.030   pH 5.0 5.0 - 8.0   Glucose, UA  NEGATIVE NEGATIVE mg/dL   Hgb urine dipstick NEGATIVE NEGATIVE   Bilirubin Urine NEGATIVE NEGATIVE   Ketones, ur NEGATIVE NEGATIVE mg/dL   Protein, ur NEGATIVE NEGATIVE mg/dL   Nitrite NEGATIVE NEGATIVE   Leukocytes,Ua MODERATE (A) NEGATIVE   RBC / HPF 6-10 0 - 5 RBC/hpf   WBC, UA 21-50 0 - 5 WBC/hpf   Bacteria, UA FEW (A) NONE SEEN   Squamous Epithelial / HPF 21-50 0 - 5 /HPF   Mucus PRESENT    Hyaline Casts, UA PRESENT    Ca Oxalate Crys, UA PRESENT     MDM   FHT present via doppler  Given ODT zofran in MAU. Tolerate PO challenge. Would like refill for zofran. Has had some constipation. Discussed management of constipation & will also prescribe reglan to use in place of zofran if constipation is worse.   Assessment and Plan   1. Nausea and vomiting during pregnancy prior to [redacted] weeks gestation   2. [redacted] weeks gestation of pregnancy    -Rx zofran & reglan -U/a with leuks. No urinary symptoms. Urine culture pending.  -Given list of OB providers. Start prenatal care.    Judeth Horn 04/22/2023, 4:24 PM

## 2023-04-22 NOTE — Discharge Instructions (Signed)
  [facility] Providers          [facility]  [address], [place], [place] [address]  [phone]  [facility]  [address], [place], [place] [address]  [phone]  [facility]  [address], [place], [place] [address]  [phone]  [facility] [address], [place], [place] [address] [phone]  [facility] [place]  [address], [place], [place] [address]  [phone]  [facility]  [address]First floor), [place], [place] [address]  [phone]  [facility]  [address], [place], [place] [address]  [phone]  [facility]  [address], [place], [place] [address]  [phone]  [facility]  [address], [place], [place] [address]  [phone]  [facility]  [address], [place], [place] [address]  [phone]  [facility]  [address], [place], [place] [address]  [phone]  [facility]  [address], [place], [place] [address]  [phone]  [facility]   [address], [place], [place] [address]  [phone]  [facility]  [address], [place], [place] [address]   [phone]  [facility] [address], [place], [place] [address] [phone]  [facility]  [address], , [place] [address]  [phone]         

## 2023-04-22 NOTE — MAU Note (Signed)
.  Sarah Rollins is a 22 y.o. at [redacted]w[redacted]d here in MAU reporting: nausea and vomiting  6-7 times per day for the last 6 weeks or so. Lower abd pain x 3-4 days. Denies bleeding. Also states she had pre-e with her first pregnancy and is worried because she has been having dizzy spells.  Onset of complaint: weeks Pain score: 0/10 There were no vitals filed for this visit.   FHT:152 Lab orders placed from triage:ua

## 2023-04-24 LAB — CULTURE, OB URINE: Special Requests: NORMAL

## 2023-05-12 ENCOUNTER — Encounter: Payer: Self-pay | Admitting: Advanced Practice Midwife

## 2023-05-12 DIAGNOSIS — Z3490 Encounter for supervision of normal pregnancy, unspecified, unspecified trimester: Secondary | ICD-10-CM | POA: Insufficient documentation

## 2023-05-16 ENCOUNTER — Ambulatory Visit (INDEPENDENT_AMBULATORY_CARE_PROVIDER_SITE_OTHER): Payer: Medicaid Other | Admitting: Advanced Practice Midwife

## 2023-05-16 ENCOUNTER — Encounter: Payer: Medicaid Other | Admitting: *Deleted

## 2023-05-16 ENCOUNTER — Encounter: Payer: Self-pay | Admitting: Advanced Practice Midwife

## 2023-05-16 VITALS — BP 119/73 | HR 84 | Wt 200.0 lb

## 2023-05-16 DIAGNOSIS — Z6831 Body mass index (BMI) 31.0-31.9, adult: Secondary | ICD-10-CM

## 2023-05-16 DIAGNOSIS — Z3A16 16 weeks gestation of pregnancy: Secondary | ICD-10-CM

## 2023-05-16 DIAGNOSIS — Z8759 Personal history of other complications of pregnancy, childbirth and the puerperium: Secondary | ICD-10-CM | POA: Diagnosis not present

## 2023-05-16 DIAGNOSIS — Z3482 Encounter for supervision of other normal pregnancy, second trimester: Secondary | ICD-10-CM | POA: Diagnosis not present

## 2023-05-16 DIAGNOSIS — Z3480 Encounter for supervision of other normal pregnancy, unspecified trimester: Secondary | ICD-10-CM

## 2023-05-16 DIAGNOSIS — Z363 Encounter for antenatal screening for malformations: Secondary | ICD-10-CM

## 2023-05-16 DIAGNOSIS — F32A Depression, unspecified: Secondary | ICD-10-CM

## 2023-05-16 DIAGNOSIS — F419 Anxiety disorder, unspecified: Secondary | ICD-10-CM

## 2023-05-16 DIAGNOSIS — Z131 Encounter for screening for diabetes mellitus: Secondary | ICD-10-CM | POA: Diagnosis not present

## 2023-05-16 MED ORDER — SERTRALINE HCL 100 MG PO TABS: ORAL_TABLET | ORAL | 3 refills | Status: DC

## 2023-05-16 MED ORDER — ASPIRIN 81 MG PO TBEC: 162.0000 mg | DELAYED_RELEASE_TABLET | Freq: Every day | ORAL | 6 refills | Status: DC

## 2023-05-16 MED ORDER — METOCLOPRAMIDE HCL 10 MG PO TABS: 10.0000 mg | ORAL_TABLET | Freq: Three times a day (TID) | ORAL | 1 refills | Status: DC | PRN

## 2023-05-16 MED ORDER — ONDANSETRON 4 MG PO TBDP: 4.0000 mg | ORAL_TABLET | Freq: Three times a day (TID) | ORAL | 1 refills | Status: DC | PRN

## 2023-05-16 MED ORDER — BLOOD PRESSURE MONITOR MISC
0 refills | Status: DC
Start: 2023-05-16 — End: 2023-11-08

## 2023-05-16 NOTE — Progress Notes (Signed)
INITIAL OBSTETRICAL VISIT Patient name: Sarah Rollins MRN [id number]  Date of birth: 13-Dec-2000 Chief Complaint:   Initial Prenatal Visit (nausea)  History of Present Illness:   Sarah Rollins is a 22 y.o. G50P1001  female at [redacted]w[redacted]d by [place] at 7 weeks with an Estimated Date of Delivery: 10/29/23 being seen today for her initial obstetrical visit.   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   Today she reports stopped buspar and zoloft after seeing a TikTok video about how it messed up someone's baby. Encouraged not to get medical advice from social media, we are always available to ask questions.   .     05/16/2023   11:37 AM 07/23/2022    9:03 AM 04/23/2022    9:57 AM 03/30/2022    3:29 PM 09/18/2021    2:24 PM  Depression screen PHQ 2/9  Decreased Interest 3 0 2 1 2   Down, Depressed, Hopeless 3 1 1 2 2   PHQ - 2 Score 6 1 3 3 4   Altered sleeping 2 3 3 1 2   Tired, decreased energy 3 3 2 2 2   Change in appetite 2 1 1 2 1   Feeling bad or failure about yourself  3 2 2 1 3   Trouble concentrating 2 3 2  0 3  Moving slowly or fidgety/restless 1 3 3  0 2  Suicidal thoughts 0 0 0 0 0  PHQ-9 Score 19 16 16 9 17   Difficult doing work/chores  Extremely dIfficult Somewhat difficult Somewhat difficult Very difficult    No LMP recorded. Patient is pregnant. Last pap  Diagnosis  Date Value Ref Range Status  05/07/2020 (A)  Final   - Atypical squamous cells of undetermined significance (ASC-US)   Review of Systems:   Pertinent items are noted in HPI Denies cramping/contractions, leakage of fluid, vaginal bleeding, abnormal vaginal discharge w/ itching/odor/irritation, headaches, visual changes, shortness of breath, chest pain, abdominal pain, severe nausea/vomiting, or problems with urination or bowel movements unless otherwise stated above.  Pertinent History Reviewed:  Reviewed past medical,surgical, social, obstetrical and family history.  Reviewed problem list, medications and  allergies. OB History  Gravida Para Term Preterm AB Living  2 1 1  0 0 1  SAB IAB Ectopic Multiple Live Births  0 0 0 0 1    # Outcome Date GA Lbr Len/2nd Weight Sex Type Anes PTL Lv  2 Current           1 Term 10/09/20 [redacted]w[redacted]d 16:54 / 01:34 6 lb 11 oz (3.033 kg) M Vag-Spont EPI N LIV     Complications: Gestational hypertension   Physical Assessment:   Vitals:   05/16/23 1138  BP: 119/73  Pulse: 84  Weight: 200 lb (90.7 kg)  Body mass index is 39.06 kg/m.       Physical Examination:  General appearance - well appearing, and in no distress  Mental status - alert, oriented to person, place, and time  Psych:  She has a normal mood and affect  Skin - warm and dry, normal color, no suspicious lesions noted  Chest - effort normal  Heart - normal rate and regular rhythm  Abdomen - soft, nontender  Extremities:  No swelling or varicosities noted  Pt declined pap today   No results found for this or any previous visit (from the past 24 hour(s)).        05/16/2023   11:37 AM 07/23/2022    9:05 AM 04/23/2022    9:59 AM 03/30/2022  3:30 PM  GAD 7 : Generalized Anxiety Score  Nervous, Anxious, on Edge 3 3 3 1   Control/stop worrying 3 3 3 1   Worry too much - different things 3 3 3 2   Trouble relaxing 3 2 1 1   Restless 3 2 3 1   Easily annoyed or irritable 3 3 3 3   Afraid - awful might happen 3 2 2 1   Total GAD 7 Score 21 18 18 10   Anxiety Difficulty  Very difficult Somewhat difficult Somewhat difficult     Assessment & Plan:  1) Low-Risk Pregnancy G2P1001 at [redacted]w[redacted]d with an Estimated Date of Delivery: 10/29/23   2) Initial OB visit  Start ASA d/t hx GHTN   1. Supervision of other normal pregnancy, antepartum  - Urine Culture - Hemoglobin A1c - PANORAMA PRENATAL TEST - Protein / creatinine ratio, urine - CHL AMB BABYSCRIPTS SCHEDULE OPTIMIZATION - Blood Pressure Monitor MISC; For regular home bp monitoring during pregnancy  Dispense: 1 each; Refill: 0 -  CBC/D/Plt+RPR+Rh+ABO+RubIgG... - Comprehensive metabolic panel - GC/Chlamydia Probe Amp - AFP, Serum, Open Spina Bifida  2. [redacted] weeks gestation of pregnancy  - Urine Culture - Hemoglobin A1c - PANORAMA PRENATAL TEST - Protein / creatinine ratio, urine - CHL AMB BABYSCRIPTS SCHEDULE OPTIMIZATION - Blood Pressure Monitor MISC; For regular home bp monitoring during pregnancy  Dispense: 1 each; Refill: 0 - CBC/D/Plt+RPR+Rh+ABO+RubIgG... - Comprehensive metabolic panel - GC/Chlamydia Probe Amp - AFP, Serum, Open Spina Bifida  3. Screening for diabetes mellitus (DM)  - Hemoglobin A1c  4. Body mass index (BMI) 31.0-31.9, adult  - Hemoglobin A1c  5. History of gestational hypertension  - Protein / creatinine ratio, urine - Comprehensive metabolic panel  6. Anxiety and depression Encouraged to restart zoloft (start w/1/2 tablet--was on 200mg /day, will need to work up to that in increments as needed) and buspar       Meds:  Meds ordered this encounter  Medications   Blood Pressure Monitor MISC    Sig: For regular home bp monitoring during pregnancy    Dispense:  1 each    Refill:  0    Z34.81 Please mail to patient   sertraline (ZOLOFT) 100 MG tablet    Sig: TAKE 1 EVERY DAY    Dispense:  180 tablet    Refill:  3    Order Specific Question:   Supervising Provider    Answer:   Duane Lope H [[id number]]    Initial labs obtained Continue prenatal vitamins Reviewed n/v relief measures and warning s/s to report Reviewed recommended weight gain based on pre-gravid BMI Encouraged well-balanced diet Genetic & carrier screening discussed: requests Panorama and AFP,   Ultrasound discussed; fetal survey: requested CCNC completed> form faxed if has or is planning to apply for medicaid The nature of [facility] [employer] with multiple MDs and other Advanced Practice Providers was explained to patient; also emphasized that fellows, residents, and students are  part of our team. Has home bp cuff.. Check bp weekly, let us know if >140/90.        Sarah Rollins 12:06 PM

## 2023-05-16 NOTE — Addendum Note (Signed)
Addended by: Jacklyn Shell on: 05/16/2023 02:54 PM   Modules accepted: Orders

## 2023-05-16 NOTE — Patient Instructions (Signed)
Sarah Rollins, I greatly value your feedback.  If you receive a survey following your visit with [place] today, we appreciate you taking the time to fill it out.  Thanks, Cathie Beams, DNP, CNM  [facility] HAS MOVED!!! It is now [facility] ([address] [place], [place] [address]) Entrance located off of [address] Free 24/7 valet parking   Nausea & Vomiting Have saltine crackers or pretzels by your bed and eat a few bites before you raise your head out of bed in the morning Eat small frequent meals throughout the day instead of large meals Drink plenty of fluids throughout the day to stay hydrated, just don't drink a lot of fluids with your meals.  This can make your stomach fill up faster making you feel sick Do not brush your teeth right after you eat Products with real ginger are good for nausea, like ginger ale and ginger hard candy Make sure it says made with real ginger! Sucking on sour candy like lemon heads is also good for nausea If your prenatal vitamins make you nauseated, take them at night so you will sleep through the nausea Sea Bands If you feel like you need medicine for the nausea & vomiting please let us know If you are unable to keep any fluids or food down please let us know   Constipation Drink plenty of fluid, preferably water, throughout the day Eat foods high in fiber such as fruits, vegetables, and grains Exercise, such as walking, is a good way to keep your bowels regular Drink warm fluids, especially warm prune juice, or decaf coffee Eat a 1/2 cup of real oatmeal (not instant), 1/2 cup applesauce, and 1/2-1 cup warm prune juice every day If needed, you may take Colace (docusate sodium) stool softener once or twice a day to help keep the stool soft.  If you still are having problems with constipation, you may take Miralax once daily as needed to help keep your bowels regular.   Home Blood Pressure Monitoring for  Patients   Your provider has recommended that you check your blood pressure (BP) at least once a week at home. If you do not have a blood pressure cuff at home, one will be provided for you. Contact your provider if you have not received your monitor within 1 week.   Helpful Tips for Accurate Home Blood Pressure Checks  Don't smoke, exercise, or drink caffeine 30 minutes before checking your BP Use the restroom before checking your BP (a full bladder can raise your pressure) Relax in a comfortable upright chair Feet on the ground Left arm resting comfortably on a flat surface at the level of your heart Legs uncrossed Back supported Sit quietly and don't talk Place the cuff on your bare arm Adjust snuggly, so that only two fingertips can fit between your skin and the top of the cuff Check 2 readings separated by at least one minute Keep a log of your BP readings For a visual, please reference this diagram: http://ccnc.care/bpdiagram  Provider Name: [facility]     Phone: [phone]  Zone 1: ALL CLEAR  Continue to monitor your symptoms:  BP reading is less than 140 (top number) or less than 90 (bottom number)  No right upper stomach pain No headaches or seeing spots No feeling nauseated or throwing up No swelling in face and hands  Zone 2: CAUTION Call your doctor's office for any of the following:  BP  reading is greater than 140 (top number) or greater than 90 (bottom number)  Stomach pain under your ribs in the middle or right side Headaches or seeing spots Feeling nauseated or throwing up Swelling in face and hands  Zone 3: EMERGENCY  Seek immediate medical care if you have any of the following:  BP reading is greater than160 (top number) or greater than 110 (bottom number) Severe headaches not improving with Tylenol Serious difficulty catching your breath Any worsening symptoms from Zone 2    First Trimester of Pregnancy The first trimester of pregnancy is from  week 1 until the end of week 12 (months 1 through 3). A week after a sperm fertilizes an egg, the egg will implant on the wall of the uterus. This embryo will begin to develop into a baby. Genes from you and your partner are forming the baby. The female genes determine whether the baby is a boy or a girl. At 6-8 weeks, the eyes and face are formed, and the heartbeat can be seen on ultrasound. At the end of 12 weeks, all the baby's organs are formed.  Now that you are pregnant, you will want to do everything you can to have a healthy baby. Two of the most important things are to get good prenatal care and to follow your health care provider's instructions. Prenatal care is all the medical care you receive before the baby's birth. This care will help prevent, find, and treat any problems during the pregnancy and childbirth. BODY CHANGES Your body goes through many changes during pregnancy. The changes vary from woman to woman.  You may gain or lose a couple of pounds at first. You may feel sick to your stomach (nauseous) and throw up (vomit). If the vomiting is uncontrollable, call your health care provider. You may tire easily. You may develop headaches that can be relieved by medicines approved by your health care provider. You may urinate more often. Painful urination may mean you have a bladder infection. You may develop heartburn as a result of your pregnancy. You may develop constipation because certain hormones are causing the muscles that push waste through your intestines to slow down. You may develop hemorrhoids or swollen, bulging veins (varicose veins). Your breasts may begin to grow larger and become tender. Your nipples may stick out more, and the tissue that surrounds them (areola) may become darker. Your gums may bleed and may be sensitive to brushing and flossing. Dark spots or blotches (chloasma, mask of pregnancy) may develop on your face. This will likely fade after the baby is  born. Your menstrual periods will stop. You may have a loss of appetite. You may develop cravings for certain kinds of food. You may have changes in your emotions from day to day, such as being excited to be pregnant or being concerned that something may go wrong with the pregnancy and baby. You may have more vivid and strange dreams. You may have changes in your hair. These can include thickening of your hair, rapid growth, and changes in texture. Some women also have hair loss during or after pregnancy, or hair that feels dry or thin. Your hair will most likely return to normal after your baby is born. WHAT TO EXPECT AT YOUR PRENATAL VISITS During a routine prenatal visit: You will be weighed to make sure you and the baby are growing normally. Your blood pressure will be taken. Your abdomen will be measured to track your baby's growth. The fetal heartbeat  will be listened to starting around week 10 or 12 of your pregnancy. Test results from any previous visits will be discussed. Your health care provider may ask you: How you are feeling. If you are feeling the baby move. If you have had any abnormal symptoms, such as leaking fluid, bleeding, severe headaches, or abdominal cramping. If you have any questions. Other tests that may be performed during your first trimester include: Blood tests to find your blood type and to check for the presence of any previous infections. They will also be used to check for low iron levels (anemia) and Rh antibodies. Later in the pregnancy, blood tests for diabetes will be done along with other tests if problems develop. Urine tests to check for infections, diabetes, or protein in the urine. An ultrasound to confirm the proper growth and development of the baby. An amniocentesis to check for possible genetic problems. Fetal screens for spina bifida and Down syndrome. You may need other tests to make sure you and the baby are doing well. HOME CARE  INSTRUCTIONS  Medicines Follow your health care provider's instructions regarding medicine use. Specific medicines may be either safe or unsafe to take during pregnancy. Take your prenatal vitamins as directed. If you develop constipation, try taking a stool softener if your health care provider approves. Diet Eat regular, well-balanced meals. Choose a variety of foods, such as meat or vegetable-based protein, fish, milk and low-fat dairy products, vegetables, fruits, and whole grain breads and cereals. Your health care provider will help you determine the amount of weight gain that is right for you. Avoid raw meat and uncooked cheese. These carry germs that can cause birth defects in the baby. Eating four or five small meals rather than three large meals a day may help relieve nausea and vomiting. If you start to feel nauseous, eating a few soda crackers can be helpful. Drinking liquids between meals instead of during meals also seems to help nausea and vomiting. If you develop constipation, eat more high-fiber foods, such as fresh vegetables or fruit and whole grains. Drink enough fluids to keep your urine clear or pale yellow. Activity and Exercise Exercise only as directed by your health care provider. Exercising will help you: Control your weight. Stay in shape. Be prepared for labor and delivery. Experiencing pain or cramping in the lower abdomen or low back is a good sign that you should stop exercising. Check with your health care provider before continuing normal exercises. Try to avoid standing for long periods of time. Move your legs often if you must stand in one place for a long time. Avoid heavy lifting. Wear low-heeled shoes, and practice good posture. You may continue to have sex unless your health care provider directs you otherwise. Relief of Pain or Discomfort Wear a good support bra for breast tenderness.   Take warm sitz baths to soothe any pain or discomfort caused by  hemorrhoids. Use hemorrhoid cream if your health care provider approves.   Rest with your legs elevated if you have leg cramps or low back pain. If you develop varicose veins in your legs, wear support hose. Elevate your feet for 15 minutes, 3-4 times a day. Limit salt in your diet. Prenatal Care Schedule your prenatal visits by the twelfth week of pregnancy. They are usually scheduled monthly at first, then more often in the last 2 months before delivery. Write down your questions. Take them to your prenatal visits. Keep all your prenatal visits as directed by  your health care provider. Safety Wear your seat belt at all times when driving. Make a list of emergency phone numbers, including numbers for family, friends, the hospital, and police and fire departments. General Tips Ask your health care provider for a referral to a local prenatal education class. Begin classes no later than at the beginning of month 6 of your pregnancy. Ask for help if you have counseling or nutritional needs during pregnancy. Your health care provider can offer advice or refer you to specialists for help with various needs. Do not use hot tubs, steam rooms, or saunas. Do not douche or use tampons or scented sanitary pads. Do not cross your legs for long periods of time. Avoid cat litter boxes and soil used by cats. These carry germs that can cause birth defects in the baby and possibly loss of the fetus by miscarriage or stillbirth. Avoid all smoking, herbs, alcohol, and medicines not prescribed by your health care provider. Chemicals in these affect the formation and growth of the baby. Schedule a dentist appointment. At home, brush your teeth with a soft toothbrush and be gentle when you floss. SEEK MEDICAL CARE IF:  You have dizziness. You have mild pelvic cramps, pelvic pressure, or nagging pain in the abdominal area. You have persistent nausea, vomiting, or diarrhea. You have a bad smelling vaginal  discharge. You have pain with urination. You notice increased swelling in your face, hands, legs, or ankles. SEEK IMMEDIATE MEDICAL CARE IF:  You have a fever. You are leaking fluid from your vagina. You have spotting or bleeding from your vagina. You have severe abdominal cramping or pain. You have rapid weight gain or loss. You vomit blood or material that looks like coffee grounds. You are exposed to [place] measles and have never had them. You are exposed to fifth disease or chickenpox. You develop a severe headache. You have shortness of breath. You have any kind of trauma, such as from a fall or a car accident. Document Released: 05/18/2001 Document Revised: 10/08/2013 Document Reviewed: 04/03/2013 [facility] Patient Information 2015 [place], [place]. This information is not intended to replace advice given to you by your health care provider. Make sure you discuss any questions you have with your health care provider.  ADDITIONAL HEALTHCARE OPTIONS FOR PATIENTS  [place] [facility] / e-Visit: [email or web address]         [facility]: [phone]  [facility]: [phone]                   [facility]: [phone]     Safe Medications in Pregnancy   Acne: Benzoyl Peroxide Salicylic Acid  Backache/Headache: Tylenol: 2 regular strength every 4 hours OR              2 Extra strength every 6 hours  Colds/Coughs/Allergies: Benadryl (alcohol free) 25 mg every 6 hours as needed Breath right strips Claritin Cepacol throat lozenges Chloraseptic throat spray Cold-Eeze- up to three times per day Cough drops, alcohol free Flonase (by prescription only) Guaifenesin Mucinex Robitussin DM (plain only, alcohol free) Saline nasal spray/drops Sudafed (pseudoephedrine) & Actifed ** use only after [redacted] weeks gestation and if you do not have high blood pressure Tylenol Vicks Vaporub Zinc  lozenges Zyrtec   Constipation: Colace Ducolax suppositories Fleet enema Glycerin suppositories Metamucil Milk of magnesia Miralax Senokot Smooth move tea  Diarrhea: Kaopectate Imodium A-D  *NO pepto Bismol  Hemorrhoids: Anusol Anusol HC Preparation H Tucks  Indigestion: Tums Maalox Mylanta  Zantac  Pepcid  Insomnia: Benadryl (alcohol free) 25mg  every 6 hours as needed Tylenol PM Unisom, no Gelcaps  Leg Cramps: Tums MagGel  Nausea/Vomiting:  Bonine Dramamine Emetrol Ginger extract Sea bands Meclizine  Nausea medication to take during pregnancy:  Unisom (doxylamine succinate 25 mg tablets) Take one tablet daily at bedtime. If symptoms are not adequately controlled, the dose can be increased to a maximum recommended dose of two tablets daily (1/2 tablet in the morning, 1/2 tablet mid-afternoon and one at bedtime). Vitamin B6 100mg  tablets. Take one tablet twice a day (up to 200 mg per day).  Skin Rashes: Aveeno products Benadryl cream or 25mg  every 6 hours as needed Calamine Lotion 1% cortisone cream  Yeast infection: Gyne-lotrimin 7 Monistat 7   **If taking multiple medications, please check labels to avoid duplicating the same active ingredients **take medication as directed on the label ** Do not exceed 4000 mg of tylenol in 24 hours **Do not take medications that contain aspirin or ibuprofen   ([phone]) [phone] is the phone number for Pregnancy Classes or hospital tours at [facility]   You will be referred to  [email or web address]   for more information on childbirth classes   At this site you may register for classes. You may sign up for a waiting list if classes are full. Please SIGN UP FOR THIS!.   When the waiting list becomes long, sometimes new classes can be added.  [facility] Call to Register: [phone] or  [phone]   or   Register Online: [email or web address] THESE CLASSES FILL UP VERY QUICKLY, SO SIGN UP AS SOON AS YOU CAN!!! Please visit [facility] pregnancy website at [email or web address]  Childbirth Classes  Option 1: Birth & Baby Series Series of 3 weekly classes, on the same day of the week (can choose Mon-Thurs) from 6-9pm Helps you and your support person prepare for childbirth Reviews newborn care, labor & birth, cesarean birth, pain management, and comfort techniques Cost: $60 per couple for insured or self-pay, $30 per couple for Medicaid  Option 2: Weekend Birth & Baby This class is a weekend version of our Birth & Baby series.  It is designed for parents who have a difficult time fitting several weeks of classes into their schedule.   Covers the care of your newborn and the basics of labor and childbirth Friday 6:30pm-8:30pm Saturday 9am-4pm, includes lunch for you and your partner  Cost: $75 per couple for insured or self-pay, $30 per couple for Medicaid  Option 3: Natural Childbirth This series of 5 weekly classes is for expectant parents who want to learn and practice natural methods of coping with the process of labor and childbirth.  Can choose Mon or Tues, 7-9pm.   Covers relaxation, breathing, massage, visualization, role of the partner, and helpful positioning Participants learn how to be confident in their body's ability to give birth. Class empowers and helps parents make informed decisions about care. Includes discussion that will help new parents transition into the immediate postpartum period.  Cost: $75 per couple for insured or self-pay, $30 per couple for Medicaid  Option 4: Online Birth & Baby This online class offers you the freedom to complete a Birth & Baby series in the comfort of your own home.  The flexibility of this option allows you to review sections at your own pace, at times convenient to you and your support people.  It includes additional  video information, animations, quizzes and extended activities. Get organized  with helpful eClass tools, checklists, and trackers.  Cost: $60 for 60 days of online access                                                                            Other Available Classes  Baby & Me Enjoy this time to discuss newborn & infant parenting topics and family adjustment issues with other new mothers in a relaxed environment. Each week brings a new speaker or baby-centered activity. We encourage mothers and their babies (birth to crawling) to join [place]. You are welcome to visit this group even if you haven't delivered yet! It's wonderful to make new friends early and watch other moms interact with their babies. No registration or fee.  Big Brother/Big Sister Let your children share in the joy of a new brother or sister in this special class designed just for them. Discussion includes how families care for babies: swaddling, holding, diapering, safety, as well as how they can be helpful in their new role. This class is designed for children ages 2 to 52, but any age is welcome. Please register each child individually. $5 Breastfeeding Support Group This group is a mother-to-mother support circle where moms have the opportunity to share their breastfeeding experiences. A Breastfeeding Support nurse is present for questions and concerns. An infant scale is available for weight checks. No fee or registration.  Breastfeeding Your Baby Breastfeeding is a special time for mother and child. This class will help you feel ready to begin this important relationship. Your partner is encouraged to attend with you. Learn what to expect and feel more confident in the first days of breastfeeding your newborn. This class also addresses the most common fears and challenges of breastfeeding during the first few weeks, months, and beyond. $30 per couple Caring for Baby This class is for expectant and adoptive parents who want to  learn and practice the most up-to-date newborn care for their babies. Focus is on birth through first six weeks of life. Topics include feeding, bathing, diapering, crying, umbilical cord care, circumcision care and safe sleep. Parents learn how to recognize symptoms of illness and when to call the pediatrician. Register only the mom-to-be and your partner can plan to come with you. (*Note: This class is included in the Birth & Baby series and the Weekend Birth & Baby classes.) $10 per couple Comfort Techniques & Tour This 2-hour interactive class is designed for those who either do not wish to take the Birth & Baby series or for those who prefer our online childbirth class, but don't want to miss the opportunity to learn and practice hands-on techniques. These skills can help relieve some of the discomfort of labor and encourage your baby to rotate toward the best position for birth. You and your partner will be able to try a variety of labor positions with birth balls and rebozos as well as practice breathing, relaxation, and visual techniques. $20 per couple [employer] [facility] This course offers Dads-to-be the tools and knowledge needed to feel confident on their journey to becoming new fathers. Experienced dads, who have been trained as coaches, teach dads-to-be how to hold, comfort, diapers, swaddle and play with their infant while being able  to support the new mom as well. $25 Grandparent Love Expecting a grandbaby? Learn about the latest infant care and safety recommendations and ways to support your own child as he or she transitions into the parenting role. $10 per person Infant and Child CPR Parents, grandparents, babysitters, and friends learn Cardio-Pulmonary Resuscitation skills for infants and children. You will also learn how to treat both conscious and unconscious choking infants and children. Register each participant individually. (Note: This Family & Friends program does not offer  certification.) $20 per person Marvelous Multiples Expecting twins, triplets, or more? This free 2-hour class covers the differences in labor, birth, parenting, and breastfeeding issues that face multiples' parents.  Maternity Care Center Virtual Tour  Online virtual tour of the new [place] [facility] at [facility] Talk This free mom-led group offers support and connection to mothers as they journey through the adjustments and struggles of that sometimes overwhelming first year after the birth of a child. A member of our staff will be present to share resources and additional support if needed, as you care for yourself and baby. You are welcome to visit this group before you deliver! It's wonderful to meet new friends early and watch other moms interact with their babies.  Waterbirth Class Interested in a waterbirth? This free informational class will help you discover whether waterbirth is the right fit for you and is required if you are planning a waterbirth. Education about waterbirth itself, supplies you may need, and what you may need from your support team is included in this class. Partners are encouraged to come.

## 2023-05-17 ENCOUNTER — Encounter: Payer: Self-pay | Admitting: Advanced Practice Midwife

## 2023-05-18 LAB — AFP, SERUM, OPEN SPINA BIFIDA
AFP MoM: 1.07
AFP Value: 30.4 ng/mL
Gest. Age on Collection Date: 16.2 wk
Maternal Age At EDD: 25.1 a
OSBR Risk 1 IN: 9862
Weight: 200 [lb_av]

## 2023-05-18 LAB — GC/CHLAMYDIA PROBE AMP: Neisseria Gonorrhoeae by PCR: NEGATIVE

## 2023-05-18 LAB — COMPREHENSIVE METABOLIC PANEL
ALT: 11 [IU]/L (ref 0–32)
AST: 17 [IU]/L (ref 0–40)
Albumin: 4 g/dL (ref 4.0–5.0)
Alkaline Phosphatase: 76 [IU]/L (ref 44–121)
BUN/Creatinine Ratio: 9 (ref 9–23)
BUN: 5 mg/dL — ABNORMAL LOW (ref 6–20)
Bilirubin Total: <0.2 mg/dL (ref 0.0–1.2)
CO2: 22 mmol/L (ref 20–29)
Calcium: 9.2 mg/dL (ref 8.7–10.2)
Chloride: 97 mmol/L (ref 96–106)
Creatinine, Ser: 0.56 mg/dL — ABNORMAL LOW (ref 0.57–1.00)
Globulin, Total: 2.7 g/dL (ref 1.5–4.5)
Glucose: 82 mg/dL (ref 70–99)
Potassium: 3.5 mmol/L (ref 3.5–5.2)
Sodium: 136 mmol/L (ref 134–144)
Total Protein: 6.7 g/dL (ref 6.0–8.5)
eGFR: 131 mL/min/{1.73_m2} (ref >59)

## 2023-05-18 LAB — CBC/D/PLT+RPR+RH+ABO+RUBIGG...
Antibody Screen: NEGATIVE
Basophils Absolute: 0 10*3/uL (ref 0.0–0.2)
Basos: 0 %
EOS (ABSOLUTE): 0 10*3/uL (ref 0.0–0.4)
Eos: 0 %
HCV Ab: NONREACTIVE
HIV Screen 4th Generation wRfx: NONREACTIVE
Hematocrit: 40.2 % (ref 34.0–46.6)
Hemoglobin: 13.6 g/dL (ref 11.1–15.9)
Hepatitis B Surface Ag: NEGATIVE
Immature Grans (Abs): 0.1 10*3/uL (ref 0.0–0.1)
Immature Granulocytes: 1 %
Lymphocytes Absolute: 1.7 10*3/uL (ref 0.7–3.1)
Lymphs: 21 %
MCH: 32.1 pg (ref 26.6–33.0)
MCHC: 33.8 g/dL (ref 31.5–35.7)
MCV: 95 fL (ref 79–97)
Monocytes Absolute: 0.5 10*3/uL (ref 0.1–0.9)
Monocytes: 6 %
Neutrophils Absolute: 5.6 10*3/uL (ref 1.4–7.0)
Neutrophils: 72 %
Platelets: 259 10*3/uL (ref 150–450)
RBC: 4.24 x10E6/uL (ref 3.77–5.28)
RDW: 12.8 % (ref 11.7–15.4)
RPR Ser Ql: NONREACTIVE
Rh Factor: POSITIVE
Rubella Antibodies, IGG: 2.13 {index} (ref >0.99)
WBC: 8 10*3/uL (ref 3.4–10.8)

## 2023-05-18 LAB — PROTEIN / CREATININE RATIO, URINE
Creatinine, Urine: 320.8 mg/dL
Protein, Ur: 37.6 mg/dL
Protein/Creat Ratio: 117 mg/g{creat} (ref 0–200)

## 2023-05-18 LAB — HCV INTERPRETATION

## 2023-05-18 LAB — HEMOGLOBIN A1C
Est. average glucose Bld gHb Est-mCnc: 97 mg/dL
Hgb A1c MFr Bld: 5 % (ref 4.8–5.6)

## 2023-05-18 LAB — URINE CULTURE: Organism ID, Bacteria: NO GROWTH

## 2023-05-19 ENCOUNTER — Other Ambulatory Visit: Payer: Self-pay | Admitting: Advanced Practice Midwife

## 2023-05-19 ENCOUNTER — Encounter: Payer: Self-pay | Admitting: Advanced Practice Midwife

## 2023-05-19 DIAGNOSIS — O98819 Other maternal infectious and parasitic diseases complicating pregnancy, unspecified trimester: Secondary | ICD-10-CM | POA: Insufficient documentation

## 2023-05-19 MED ORDER — AZITHROMYCIN 500 MG PO TABS
1000.0000 mg | ORAL_TABLET | Freq: Once | ORAL | 0 refills | Status: AC
Start: 2023-05-19 — End: 2023-05-19

## 2023-05-23 LAB — PANORAMA PRENATAL TEST FULL PANEL:PANORAMA TEST PLUS 5 ADDITIONAL MICRODELETIONS: FETAL FRACTION: 10

## 2023-06-03 ENCOUNTER — Other Ambulatory Visit: Payer: Self-pay | Admitting: Advanced Practice Midwife

## 2023-06-03 DIAGNOSIS — Z3A16 16 weeks gestation of pregnancy: Secondary | ICD-10-CM

## 2023-06-03 DIAGNOSIS — Z6831 Body mass index (BMI) 31.0-31.9, adult: Secondary | ICD-10-CM

## 2023-06-03 DIAGNOSIS — Z3480 Encounter for supervision of other normal pregnancy, unspecified trimester: Secondary | ICD-10-CM

## 2023-06-03 DIAGNOSIS — F419 Anxiety disorder, unspecified: Secondary | ICD-10-CM

## 2023-06-03 DIAGNOSIS — Z8759 Personal history of other complications of pregnancy, childbirth and the puerperium: Secondary | ICD-10-CM

## 2023-06-03 DIAGNOSIS — Z363 Encounter for antenatal screening for malformations: Secondary | ICD-10-CM

## 2023-06-03 DIAGNOSIS — Z131 Encounter for screening for diabetes mellitus: Secondary | ICD-10-CM

## 2023-06-07 ENCOUNTER — Ambulatory Visit: Payer: Medicaid Other | Admitting: Radiology

## 2023-06-07 ENCOUNTER — Encounter: Payer: Self-pay | Admitting: Obstetrics & Gynecology

## 2023-06-07 ENCOUNTER — Ambulatory Visit: Payer: Medicaid Other | Admitting: Obstetrics & Gynecology

## 2023-06-07 VITALS — BP 112/70 | HR 87 | Wt 154.0 lb

## 2023-06-07 DIAGNOSIS — Z3480 Encounter for supervision of other normal pregnancy, unspecified trimester: Secondary | ICD-10-CM

## 2023-06-07 DIAGNOSIS — Z8759 Personal history of other complications of pregnancy, childbirth and the puerperium: Secondary | ICD-10-CM

## 2023-06-07 DIAGNOSIS — Z3A19 19 weeks gestation of pregnancy: Secondary | ICD-10-CM | POA: Diagnosis not present

## 2023-06-07 DIAGNOSIS — Z363 Encounter for antenatal screening for malformations: Secondary | ICD-10-CM | POA: Diagnosis not present

## 2023-06-07 DIAGNOSIS — Z3482 Encounter for supervision of other normal pregnancy, second trimester: Secondary | ICD-10-CM

## 2023-06-07 NOTE — Progress Notes (Signed)
 GA 19+3 wks  Single active female fetus,  cephalic  FHR = 138 bpm    posterior pl, gr1   SVP = 3.7cm,   nl amn fluid vol  EFW 19%  264g   Incomplete cardiac screen, need GV's crossing/RVOT/LVOT/3VV/3VTV Normal ovaries - neg adnexal regions  CL = 5.6 cm,  closed

## 2023-06-07 NOTE — Progress Notes (Signed)
   LOW-RISK PREGNANCY VISIT Patient name: Sarah Rollins MRN [id number]  Date of birth: [birth year] Chief Complaint:   Routine Prenatal Visit  History of Present Illness:   Sarah Rollins is a 22 y.o. G41P1001 female at [redacted]w[redacted]d with an Estimated Date of Delivery: 10/29/23 being seen today for ongoing management of a low-risk pregnancy.     05/16/2023   11:37 AM 07/23/2022    9:03 AM 04/23/2022    9:57 AM 03/30/2022    3:29 PM 09/18/2021    2:24 PM  Depression screen PHQ 2/9  Decreased Interest 3 0 2 1 2   Down, Depressed, Hopeless 3 1 1 2 2   PHQ - 2 Score 6 1 3 3 4   Altered sleeping 2 3 3 1 2   Tired, decreased energy 3 3 2 2 2   Change in appetite 2 1 1 2 1   Feeling bad or failure about yourself  3 2 2 1 3   Trouble concentrating 2 3 2  0 3  Moving slowly or fidgety/restless 1 3 3  0 2  Suicidal thoughts 0 0 0 0 0  PHQ-9 Score 19 16 16 9 17   Difficult doing work/chores  Extremely dIfficult Somewhat difficult Somewhat difficult Very difficult    Today she reports no complaints. Contractions: Not present. Vag. Bleeding: None.  Movement: Present. denies leaking of fluid. Review of Systems:   Pertinent items are noted in HPI Denies abnormal vaginal discharge w/ itching/odor/irritation, headaches, visual changes, shortness of breath, chest pain, abdominal pain, severe nausea/vomiting, or problems with urination or bowel movements unless otherwise stated above. Pertinent History Reviewed:  Reviewed past medical,surgical, social, obstetrical and family history.  Reviewed problem list, medications and allergies. Physical Assessment:   Vitals:   06/07/23 1121  BP: 112/70  Pulse: 87  Weight: 154 lb (69.9 kg)  Body mass index is 30.08 kg/m.        Physical Examination:   General appearance: Well appearing, and in no distress  Mental status: Alert, oriented to person, place, and time  Skin: Warm & dry  Cardiovascular: Normal heart rate noted  Respiratory: Normal respiratory effort, no  distress  Abdomen: Soft, gravid, nontender  Pelvic: Cervical exam deferred         Extremities:    Fetal Status:     Movement: Present    Chaperone: n/a    No results found for this or any previous visit (from the past 24 hours).  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  Labs/procedures today: sonogram  Plan:  Continue routine obstetrical care  Next visit: prefers in person    Reviewed: Preterm labor symptoms and general obstetric precautions including but not limited to vaginal bleeding, contractions, leaking of fluid and fetal movement were reviewed in detail with the patient.  All questions were answered. Has home bp cuff. Rx faxed to . Check bp weekly, let us  know if >140/90.   Follow-up: Return in about 4 weeks (around 07/05/2023) for repeat sonogram.  No orders of the defined types were placed in this encounter.   Vonn VEAR Inch, MD 06/07/2023 12:02 PM

## 2023-06-08 NOTE — L&D Delivery Note (Signed)
 Delivery Note Sarah Rollins is a 23 y.o. G2P1001 at [redacted]w[redacted]d admitted for PPROM.   GBS Status:     Labor course: Initial SVE: 4.5/60/-2. Augmentation with: Pitocin . She then progressed to complete.  ROM: 2h 55m with light meconium fluid  Birth: After a brief 2nd stage, she delivered a Live born female  Birth Weight:   APGAR: ,   Newborn Delivery   Birth date/time: 10/[birth year]2023 21:58:00 Delivery type: Vaginal, Spontaneous        Delivered via spontaneous vaginal delivery (Presentation: OA ). Nuchal cord present: No. . Shoulders and body delivered in usual fashion. Infant placed directly on mom's abdomen for bonding/skin-to-skin, baby dried and stimulated. Cord clamped x 2 after 1 minute and cut by FOB.  Cord blood collected. Placenta delivered-Spontaneous with 3 vessels. 20u Pitocin  in 500cc LR given as a bolus prior to delivery of placenta.  Fundus firm with massage. Placenta inspected and appears to be intact with a 3 VC.  Sponge and instrument count were correct x2.  Intrapartum complications:  None Anesthesia:  epidural Lacerations:  none Suture Repair:  EBL (mL):257.00    Mom to postpartum.  Baby to Couplet care / Skin to Skin. Placenta to L&D   Plans to Breastfeed Contraception: oral progesterone-only contraceptive Circumcision: N/A  Note sent to [facility]: FT for pp visit.  Delivery Report:  Review the Delivery Report for details.     Signed: Majel Scott, DNP,CNM 10/03/2023, 10:38 PM

## 2023-06-28 ENCOUNTER — Other Ambulatory Visit: Payer: Self-pay | Admitting: Obstetrics & Gynecology

## 2023-06-28 DIAGNOSIS — Z363 Encounter for antenatal screening for malformations: Secondary | ICD-10-CM

## 2023-06-28 DIAGNOSIS — Z8759 Personal history of other complications of pregnancy, childbirth and the puerperium: Secondary | ICD-10-CM

## 2023-06-29 ENCOUNTER — Encounter: Payer: Self-pay | Admitting: Family Medicine

## 2023-06-30 ENCOUNTER — Other Ambulatory Visit: Payer: Self-pay | Admitting: *Deleted

## 2023-06-30 ENCOUNTER — Encounter: Payer: Self-pay | Admitting: Advanced Practice Midwife

## 2023-06-30 DIAGNOSIS — F32A Depression, unspecified: Secondary | ICD-10-CM

## 2023-06-30 MED ORDER — ONDANSETRON 4 MG PO TBDP: 4.0000 mg | ORAL_TABLET | Freq: Three times a day (TID) | ORAL | 1 refills | Status: DC | PRN

## 2023-07-05 ENCOUNTER — Encounter: Payer: Self-pay | Admitting: Women's Health

## 2023-07-05 ENCOUNTER — Ambulatory Visit: Payer: Medicaid Other | Admitting: Radiology

## 2023-07-05 ENCOUNTER — Other Ambulatory Visit (HOSPITAL_COMMUNITY)
Admission: RE | Admit: 2023-07-05 | Discharge: 2023-07-05 | Disposition: A | Discharge status: Home / Self Care | Payer: Medicaid Other | Source: Ambulatory Visit | Attending: Women's Health | Admitting: Women's Health

## 2023-07-05 ENCOUNTER — Ambulatory Visit (INDEPENDENT_AMBULATORY_CARE_PROVIDER_SITE_OTHER): Payer: Medicaid Other | Admitting: Women's Health

## 2023-07-05 VITALS — BP 99/65 | HR 70 | Wt 156.0 lb

## 2023-07-05 DIAGNOSIS — F32A Depression, unspecified: Secondary | ICD-10-CM

## 2023-07-05 DIAGNOSIS — Z363 Encounter for antenatal screening for malformations: Secondary | ICD-10-CM

## 2023-07-05 DIAGNOSIS — Z124 Encounter for screening for malignant neoplasm of cervix: Secondary | ICD-10-CM

## 2023-07-05 DIAGNOSIS — Z3A23 23 weeks gestation of pregnancy: Secondary | ICD-10-CM

## 2023-07-05 DIAGNOSIS — Z8759 Personal history of other complications of pregnancy, childbirth and the puerperium: Secondary | ICD-10-CM

## 2023-07-05 DIAGNOSIS — F418 Other specified anxiety disorders: Secondary | ICD-10-CM

## 2023-07-05 DIAGNOSIS — Z3480 Encounter for supervision of other normal pregnancy, unspecified trimester: Secondary | ICD-10-CM

## 2023-07-05 DIAGNOSIS — Z3482 Encounter for supervision of other normal pregnancy, second trimester: Secondary | ICD-10-CM

## 2023-07-05 DIAGNOSIS — Z8659 Personal history of other mental and behavioral disorders: Secondary | ICD-10-CM

## 2023-07-05 DIAGNOSIS — F419 Anxiety disorder, unspecified: Secondary | ICD-10-CM

## 2023-07-05 MED ORDER — METOCLOPRAMIDE HCL 10 MG PO TABS: 10.0000 mg | ORAL_TABLET | Freq: Three times a day (TID) | ORAL | 1 refills | Status: DC | PRN

## 2023-07-05 NOTE — Progress Notes (Signed)
[place]:  GA = 23+3 weeks  Single active female fetus,  breech,  FHR = 149 bpm  posterior pl high, gr1,  MVP = 4.5 cm EFW 21.5%  548g   cardiac screen completed - no apparent abn CL = 4 cm,  closed   -  ov's normal   (anatomy complete)

## 2023-07-05 NOTE — Patient Instructions (Signed)
Sarah Rollins, thank you for choosing our office today! We appreciate the opportunity to meet your healthcare needs. You may receive a short survey by mail, e-mail, or through [employer]. If you are happy with your care we would appreciate if you could take just a few minutes to complete the survey questions. We read all of your comments and take your feedback very seriously. Thank you again for choosing our office.  Center for [employer] Team at [facility]  [facility] at [facility] ([address] [place], [place] [address]) Entrance C, located off of [address] Free 24/7 valet parking   You will have your sugar test next visit.  Please do not eat or drink anything after midnight the night before you come, not even water.  You will be here for at least two hours.  Please make an appointment online for the bloodwork at [email or web address] for 8:00am (or as close to this as possible). Make sure you select the [facility] service center.   CLASSES: Go to [email or web address] to register for classes (childbirth, breastfeeding, waterbirth, infant CPR, daddy bootcamp, etc.)  Call the office [phone]) or go to [facility] if: You begin to have strong, frequent contractions Your water breaks.  Sometimes it is a big gush of fluid, sometimes it is just a trickle that keeps getting your panties wet or running down your legs You have vaginal bleeding.  It is normal to have a small amount of spotting if your cervix was checked.  You don't feel your baby moving like normal.  If you don't, get you something to eat and drink and lay down and focus on feeling your baby move.   If your baby is still not moving like normal, you should call the office or go to [facility].  Call the office [phone]) or go to [facility] hospital for these signs of pre-eclampsia: Severe headache that does not go away with Tylenol Visual changes- seeing spots, double, blurred vision Pain under your right breast or upper  abdomen that does not go away with Tums or heartburn medicine Nausea and/or vomiting Severe swelling in your hands, feet, and face    [place] Pediatricians/Family Doctors [facility]): [address] Colette Ribas, [phone]           [facility]: [address], [phone]                [facility]): [address], [phone]  [facility] Department: [address], [place], [phone]    [facility] Pediatricians/Family Doctors [facility]): [address], [phone] [facility]: [address] [place], [phone] [facility]: [address], [phone]  [facility] Doctors  [facility]): [phone] [facility]: [address] [phone]   [facility] Doctors [facility]: [address], [phone]  [facility] Doctors  [facility]: [phone], [phone]  Home Blood Pressure Monitoring for Patients   Your provider has recommended that you check your blood pressure (BP) at least once a week at home. If you do not have a blood pressure cuff at home, one will be provided for you. Contact your provider if you have not received your monitor within 1 week.   Helpful Tips for Accurate Home Blood Pressure Checks  Don't smoke, exercise, or drink caffeine 30 minutes before checking  your BP Use the restroom before checking your BP (a full bladder can raise your pressure) Relax in a comfortable upright chair Feet on the ground Left arm resting comfortably on a flat surface at the level of your heart Legs uncrossed Back supported Sit quietly and don't talk Place the cuff on your bare arm Adjust snuggly, so that only two fingertips can fit between your skin and the top of the cuff Check 2 readings separated by at least one  minute Keep a log of your BP readings For a visual, please reference this diagram: http://ccnc.care/bpdiagram  Provider Name: [facility]     Phone: [phone]  Zone 1: ALL CLEAR  Continue to monitor your symptoms:  BP reading is less than 140 (top number) or less than 90 (bottom number)  No right upper stomach pain No headaches or seeing spots No feeling nauseated or throwing up No swelling in face and hands  Zone 2: CAUTION Call your doctor's office for any of the following:  BP reading is greater than 140 (top number) or greater than 90 (bottom number)  Stomach pain under your ribs in the middle or right side Headaches or seeing spots Feeling nauseated or throwing up Swelling in face and hands  Zone 3: EMERGENCY  Seek immediate medical care if you have any of the following:  BP reading is greater than160 (top number) or greater than 110 (bottom number) Severe headaches not improving with Tylenol Serious difficulty catching your breath Any worsening symptoms from Zone 2   Second Trimester of Pregnancy The second trimester is from week 13 through week 28, months 4 through 6. The second trimester is often a time when you feel your best. Your body has also adjusted to being pregnant, and you begin to feel better physically. Usually, morning sickness has lessened or quit completely, you may have more energy, and you may have an increase in appetite. The second trimester is also a time when the fetus is growing rapidly. At the end of the sixth month, the fetus is about 9 inches long and weighs about 1 pounds. You will likely begin to feel the baby move (quickening) between 18 and 20 weeks of the pregnancy. BODY CHANGES Your body goes through many changes during pregnancy. The changes vary from woman to woman.  Your weight will continue to increase. You will notice your lower abdomen bulging out. You may begin to get stretch marks on your hips, abdomen, and breasts. You may  develop headaches that can be relieved by medicines approved by your health care provider. You may urinate more often because the fetus is pressing on your bladder. You may develop or continue to have heartburn as a result of your pregnancy. You may develop constipation because certain hormones are causing the muscles that push waste through your intestines to slow down. You may develop hemorrhoids or swollen, bulging veins (varicose veins). You may have back pain because of the weight gain and pregnancy hormones relaxing your joints between the bones in your pelvis and as a result of a shift in weight and the muscles that support your balance. Your breasts will continue to grow and be tender. Your gums may bleed and may be sensitive to brushing and flossing. Dark spots or blotches (chloasma, mask of pregnancy) may develop on your face. This will likely fade after the baby is born. A dark line from your belly button to the pubic area (linea nigra) may appear. This will likely fade after the  baby is born. You may have changes in your hair. These can include thickening of your hair, rapid growth, and changes in texture. Some women also have hair loss during or after pregnancy, or hair that feels dry or thin. Your hair will most likely return to normal after your baby is born. WHAT TO EXPECT AT YOUR PRENATAL VISITS During a routine prenatal visit: You will be weighed to make sure you and the fetus are growing normally. Your blood pressure will be taken. Your abdomen will be measured to track your baby's growth. The fetal heartbeat will be listened to. Any test results from the previous visit will be discussed. Your health care provider may ask you: How you are feeling. If you are feeling the baby move. If you have had any abnormal symptoms, such as leaking fluid, bleeding, severe headaches, or abdominal cramping. If you have any questions. Other tests that may be performed during your second  trimester include: Blood tests that check for: Low iron levels (anemia). Gestational diabetes (between 24 and 28 weeks). Rh antibodies. Urine tests to check for infections, diabetes, or protein in the urine. An ultrasound to confirm the proper growth and development of the baby. An amniocentesis to check for possible genetic problems. Fetal screens for spina bifida and Down syndrome. HOME CARE INSTRUCTIONS  Avoid all smoking, herbs, alcohol, and unprescribed drugs. These chemicals affect the formation and growth of the baby. Follow your health care provider's instructions regarding medicine use. There are medicines that are either safe or unsafe to take during pregnancy. Exercise only as directed by your health care provider. Experiencing uterine cramps is a good sign to stop exercising. Continue to eat regular, healthy meals. Wear a good support bra for breast tenderness. Do not use hot tubs, steam rooms, or saunas. Wear your seat belt at all times when driving. Avoid raw meat, uncooked cheese, cat litter boxes, and soil used by cats. These carry germs that can cause birth defects in the baby. Take your prenatal vitamins. Try taking a stool softener (if your health care provider approves) if you develop constipation. Eat more high-fiber foods, such as fresh vegetables or fruit and whole grains. Drink plenty of fluids to keep your urine clear or pale yellow. Take warm sitz baths to soothe any pain or discomfort caused by hemorrhoids. Use hemorrhoid cream if your health care provider approves. If you develop varicose veins, wear support hose. Elevate your feet for 15 minutes, 3-4 times a day. Limit salt in your diet. Avoid heavy lifting, wear low heel shoes, and practice good posture. Rest with your legs elevated if you have leg cramps or low back pain. Visit your dentist if you have not gone yet during your pregnancy. Use a soft toothbrush to brush your teeth and be gentle when you floss. A  sexual relationship may be continued unless your health care provider directs you otherwise. Continue to go to all your prenatal visits as directed by your health care provider. SEEK MEDICAL CARE IF:  You have dizziness. You have mild pelvic cramps, pelvic pressure, or nagging pain in the abdominal area. You have persistent nausea, vomiting, or diarrhea. You have a bad smelling vaginal discharge. You have pain with urination. SEEK IMMEDIATE MEDICAL CARE IF:  You have a fever. You are leaking fluid from your vagina. You have spotting or bleeding from your vagina. You have severe abdominal cramping or pain. You have rapid weight gain or loss. You have shortness of breath with chest pain. You  notice sudden or extreme swelling of your face, hands, ankles, feet, or legs. You have not felt your baby move in over an hour. You have severe headaches that do not go away with medicine. You have vision changes. Document Released: 05/18/2001 Document Revised: 05/29/2013 Document Reviewed: 07/25/2012 [facility] Patient Information 2015 [place], [place]. This information is not intended to replace advice given to you by your health care provider. Make sure you discuss any questions you have with your health care provider.

## 2023-07-05 NOTE — Progress Notes (Signed)
LOW-RISK PREGNANCY VISIT Patient name: Sarah Rollins MRN [id number]  Date of birth: 2001-05-06 Chief Complaint:   Routine Prenatal Visit and Pregnancy Ultrasound (pap)  History of Present Illness:   Sarah Rollins is a 23 y.o. G49P1001 female at [redacted]w[redacted]d with an Estimated Date of Delivery: 10/29/23 being seen today for ongoing management of a low-risk pregnancy.   Today she reports  needs refill on reglan . Lots of anxiety. Contractions: Not present.  .  Movement: Present. denies leaking of fluid.     05/16/2023   11:37 AM 07/23/2022    9:03 AM 04/23/2022    9:57 AM 03/30/2022    3:29 PM 09/18/2021    2:24 PM  Depression screen PHQ 2/9  Decreased Interest 3 0 2 1 2   Down, Depressed, Hopeless 3 1 1 2 2   PHQ - 2 Score 6 1 3 3 4   Altered sleeping 2 3 3 1 2   Tired, decreased energy 3 3 2 2 2   Change in appetite 2 1 1 2 1   Feeling bad or failure about yourself  3 2 2 1 3   Trouble concentrating 2 3 2  0 3  Moving slowly or fidgety/restless 1 3 3  0 2  Suicidal thoughts 0 0 0 0 0  PHQ-9 Score 19 16 16 9 17   Difficult doing work/chores  Extremely dIfficult Somewhat difficult Somewhat difficult Very difficult        05/16/2023   11:37 AM 07/23/2022    9:05 AM 04/23/2022    9:59 AM 03/30/2022    3:30 PM  GAD 7 : Generalized Anxiety Score  Nervous, Anxious, on Edge 3 3 3 1   Control/stop worrying 3 3 3 1   Worry too much - different things 3 3 3 2   Trouble relaxing 3 2 1 1   Restless 3 2 3 1   Easily annoyed or irritable 3 3 3 3   Afraid - awful might happen 3 2 2 1   Total GAD 7 Score 21 18 18 10   Anxiety Difficulty  Very difficult Somewhat difficult Somewhat difficult      Review of Systems:   Pertinent items are noted in HPI Denies abnormal vaginal discharge w/ itching/odor/irritation, headaches, visual changes, shortness of breath, chest pain, abdominal pain, severe nausea/vomiting, or problems with urination or bowel movements unless otherwise stated above. Pertinent History Reviewed:   Reviewed past medical,surgical, social, obstetrical and family history.  Reviewed problem list, medications and allergies. Physical Assessment:   Vitals:   07/05/23 0907  BP: 99/65  Pulse: 70  Weight: 156 lb (70.8 kg)  Body mass index is 30.47 kg/m.        Physical Examination:   General appearance: Well appearing, and in no distress  Mental status: Alert, oriented to person, place, and time  Skin: Warm & dry  Cardiovascular: Normal heart rate noted  Respiratory: Normal respiratory effort, no distress  Abdomen: Soft, gravid, nontender  Pelvic: Cervical exam deferred         Extremities: Edema: Trace  Fetal Status:     Movement: Present   [place]:  GA = 23+3 weeks   Single active female fetus,  breech,  FHR = 149 bpm  posterior pl high, gr1,  MVP = 4.5 cm EFW 21.5%  548g   cardiac screen completed - no apparent abn CL = 4 cm,  closed   -  ov's normal   (anatomy complete)  Chaperone: N/A   No results found for this or any previous visit (from the past  24 hours).  Assessment & Plan:  1) Low-risk pregnancy G2P1001 at [redacted]w[redacted]d with an Estimated Date of Delivery: 10/29/23   2) Dep/anx, on zoloft 200mg , ordered [facility] referral today  3) Recent +CT> POC today  4) Cervical cancer screening> pap today  5) Nausea> refilled reglan per request  6) H/O GHTN> ASA   Meds:  Meds ordered this encounter  Medications   metoCLOPramide (REGLAN) 10 MG tablet    Sig: Take 1 tablet (10 mg total) by mouth every 8 (eight) hours as needed.    Dispense:  30 tablet    Refill:  1   Labs/procedures today: pap, GC/CT, U/S, and declined flu shot  Plan:  Continue routine obstetrical care  Next visit: prefers will be in person for pn2     Reviewed: Preterm labor symptoms and general obstetric precautions including but not limited to vaginal bleeding, contractions, leaking of fluid and fetal movement were reviewed in detail with the patient.  All questions were answered. Does have home bp cuff. Office bp  cuff given: not applicable. Check bp weekly, let us know if consistently >140 and/or >90.  Follow-up: Return in about 4 weeks (around 08/02/2023) for LROB, PN2, CNM, in person.  Future Appointments  Date Time Provider Department Center  08/02/2023  9:10 AM Lazaro Arms, MD [facility]  08/03/2023  8:50 AM [facility]    Orders Placed This Encounter  Procedures   Amb ref to [facility]   Cheral Marker CNM, [facility] 07/05/2023 9:39 AM

## 2023-07-08 LAB — CYTOLOGY - PAP
Chlamydia: NEGATIVE
Comment: NEGATIVE
Comment: NEGATIVE
Comment: NEGATIVE
Comment: NEGATIVE
Comment: NORMAL
Diagnosis: UNDETERMINED — AB
HPV 16: NEGATIVE
HPV 18 / 45: NEGATIVE
High risk HPV: POSITIVE — AB
Neisseria Gonorrhea: NEGATIVE

## 2023-07-11 ENCOUNTER — Encounter: Payer: Self-pay | Admitting: Women's Health

## 2023-08-02 ENCOUNTER — Other Ambulatory Visit: Payer: Medicaid Other

## 2023-08-02 ENCOUNTER — Encounter: Payer: Self-pay | Admitting: Obstetrics & Gynecology

## 2023-08-02 ENCOUNTER — Ambulatory Visit: Payer: Medicaid Other | Admitting: Obstetrics & Gynecology

## 2023-08-02 VITALS — BP 117/77 | HR 77 | Wt 157.0 lb

## 2023-08-02 DIAGNOSIS — Z131 Encounter for screening for diabetes mellitus: Secondary | ICD-10-CM

## 2023-08-02 DIAGNOSIS — Z3A27 27 weeks gestation of pregnancy: Secondary | ICD-10-CM | POA: Diagnosis not present

## 2023-08-02 DIAGNOSIS — Z3480 Encounter for supervision of other normal pregnancy, unspecified trimester: Secondary | ICD-10-CM

## 2023-08-02 DIAGNOSIS — Z8759 Personal history of other complications of pregnancy, childbirth and the puerperium: Secondary | ICD-10-CM

## 2023-08-02 NOTE — Progress Notes (Signed)
.    LOW-RISK PREGNANCY VISIT Patient name: Sarah Rollins MRN [id number]  Date of birth: 28-Oct-2000 Chief Complaint:   Routine Prenatal Visit  History of Present Illness:   Sarah Rollins is a 23 y.o. G69P1001 female at [redacted]w[redacted]d with an Estimated Date of Delivery: 10/29/23 being seen today for ongoing management of a low-risk pregnancy.     05/16/2023   11:37 AM 07/23/2022    9:03 AM 04/23/2022    9:57 AM 03/30/2022    3:29 PM 09/18/2021    2:24 PM  Depression screen PHQ 2/9  Decreased Interest 3 0 2 1 2   Down, Depressed, Hopeless 3 1 1 2 2   PHQ - 2 Score 6 1 3 3 4   Altered sleeping 2 3 3 1 2   Tired, decreased energy 3 3 2 2 2   Change in appetite 2 1 1 2 1   Feeling bad or failure about yourself  3 2 2 1 3   Trouble concentrating 2 3 2  0 3  Moving slowly or fidgety/restless 1 3 3  0 2  Suicidal thoughts 0 0 0 0 0  PHQ-9 Score 19 16 16 9 17   Difficult doing work/chores  Extremely dIfficult Somewhat difficult Somewhat difficult Very difficult    Today she reports no complaints. Contractions: Not present. Vag. Bleeding: None.  Movement: Present. denies leaking of fluid. Review of Systems:   Pertinent items are noted in HPI Denies abnormal vaginal discharge w/ itching/odor/irritation, headaches, visual changes, shortness of breath, chest pain, abdominal pain, severe nausea/vomiting, or problems with urination or bowel movements unless otherwise stated above. Pertinent History Reviewed:  Reviewed past medical,surgical, social, obstetrical and family history.  Reviewed problem list, medications and allergies. Physical Assessment:   Vitals:   08/02/23 0914  BP: 117/77  Pulse: 77  Weight: 157 lb (71.2 kg)  Body mass index is 30.66 kg/m.        Physical Examination:   General appearance: Well appearing, and in no distress  Mental status: Alert, oriented to person, place, and time  Skin: Warm & dry  Cardiovascular: Normal heart rate noted  Respiratory: Normal respiratory effort, no  distress  Abdomen: Soft, gravid, nontender  Pelvic: Cervical exam deferred         Extremities:    Fetal Status: Fetal Heart Rate (bpm): 140s Fundal Height: 28 cm Movement: Present    Chaperone: n/a    No results found for this or any previous visit (from the past 24 hours).  Assessment & Plan:  1) Low-risk pregnancy G2P1001 at [redacted]w[redacted]d with an Estimated Date of Delivery: 10/29/23   2) Hx of gHTN on ASA 162 mg   Meds: No orders of the defined types were placed in this encounter.  Labs/procedures today:   Plan:  Continue routine obstetrical care  Next visit: prefers in person    Reviewed: Preterm labor symptoms and general obstetric precautions including but not limited to vaginal bleeding, contractions, leaking of fluid and fetal movement were reviewed in detail with the patient.  All questions were answered. Has home bp cuff. Rx faxed to . Check bp weekly, let us know if >140/90.   Follow-up: Return in about 3 weeks (around 08/23/2023) for LROB.  No orders of the defined types were placed in this encounter.   Lazaro Arms, MD 08/02/2023 9:42 AM

## 2023-08-03 ENCOUNTER — Encounter: Payer: Self-pay | Admitting: Women's Health

## 2023-08-03 ENCOUNTER — Other Ambulatory Visit: Payer: Medicaid Other

## 2023-08-03 LAB — CBC
Hematocrit: 36 % (ref 34.0–46.6)
Hemoglobin: 12 g/dL (ref 11.1–15.9)
MCH: 33.2 pg — ABNORMAL HIGH (ref 26.6–33.0)
MCHC: 33.3 g/dL (ref 31.5–35.7)
MCV: 100 fL — ABNORMAL HIGH (ref 79–97)
Platelets: 242 10*3/uL (ref 150–450)
RBC: 3.61 x10E6/uL — ABNORMAL LOW (ref 3.77–5.28)
RDW: 12.1 % (ref 11.7–15.4)
WBC: 9.8 10*3/uL (ref 3.4–10.8)

## 2023-08-03 LAB — GLUCOSE TOLERANCE, 2 HOURS W/ 1HR
Glucose, 1 hour: 128 mg/dL (ref 70–179)
Glucose, 2 hour: 143 mg/dL (ref 70–152)
Glucose, Fasting: 80 mg/dL (ref 70–91)

## 2023-08-03 LAB — RPR: RPR Ser Ql: NONREACTIVE

## 2023-08-03 LAB — ANTIBODY SCREEN: Antibody Screen: NEGATIVE

## 2023-08-03 LAB — HIV ANTIBODY (ROUTINE TESTING W REFLEX): HIV Screen 4th Generation wRfx: NONREACTIVE

## 2023-08-24 ENCOUNTER — Ambulatory Visit: Payer: Medicaid Other | Admitting: Advanced Practice Midwife

## 2023-08-24 ENCOUNTER — Encounter: Attending: Advanced Practice Midwife | Admitting: Emergency Medicine

## 2023-08-24 ENCOUNTER — Other Ambulatory Visit: Payer: Self-pay

## 2023-08-24 VITALS — BP 131/87 | HR 123 | Wt 154.0 lb

## 2023-08-24 VITALS — BP 128/75 | HR 99 | Temp 98.2°F | Resp 18

## 2023-08-24 DIAGNOSIS — Z3A30 30 weeks gestation of pregnancy: Secondary | ICD-10-CM | POA: Insufficient documentation

## 2023-08-24 DIAGNOSIS — E860 Dehydration: Secondary | ICD-10-CM | POA: Diagnosis present

## 2023-08-24 DIAGNOSIS — O99280 Endocrine, nutritional and metabolic diseases complicating pregnancy, unspecified trimester: Secondary | ICD-10-CM | POA: Diagnosis present

## 2023-08-24 DIAGNOSIS — O99283 Endocrine, nutritional and metabolic diseases complicating pregnancy, third trimester: Secondary | ICD-10-CM | POA: Diagnosis present

## 2023-08-24 DIAGNOSIS — Z3480 Encounter for supervision of other normal pregnancy, unspecified trimester: Secondary | ICD-10-CM

## 2023-08-24 LAB — POCT URINALYSIS DIPSTICK

## 2023-08-24 MED ORDER — PROMETHAZINE HCL 25 MG/ML IJ SOLN
25.0000 mg | Freq: Once | INTRAVENOUS | Status: AC
  Administered 2023-08-24: 25 mg via INTRAVENOUS
  Filled 2023-08-24: qty 1

## 2023-08-24 MED ORDER — PROMETHAZINE HCL 25 MG/ML IJ SOLN
25.0000 mg | Freq: Once | INTRAMUSCULAR | Status: DC
  Filled 2023-08-24: qty 1

## 2023-08-24 MED ORDER — ONDANSETRON 4 MG PO TBDP: 4.0000 mg | ORAL_TABLET | Freq: Three times a day (TID) | ORAL | 1 refills | Status: DC | PRN

## 2023-08-24 MED ORDER — LACTATED RINGERS IV BOLUS
1000.0000 mL | Freq: Once | INTRAVENOUS | Status: DC
  Filled 2023-08-24: qty 1000

## 2023-08-24 MED ORDER — PROMETHAZINE HCL 25 MG/ML IJ SOLN: 25.0000 mg | Freq: Once | INTRAVENOUS | Status: DC

## 2023-08-24 NOTE — Progress Notes (Signed)
 LOW-RISK PREGNANCY VISIT Patient name: Sarah Rollins MRN [id number]  Date of birth: [birth year] Chief Complaint:   Routine Prenatal Visit  History of Present Illness:   Sarah Rollins is a 23 y.o. G8P1001 female at [redacted]w[redacted]d with an Estimated Date of Delivery: 10/29/23 being seen today for ongoing management of a low-risk pregnancy.  Today she reports  N/V/D since Saturday; abd cramping; no bldg or leaking . Contractions: Irregular. Vag. Bleeding: None.  Movement: Present. denies leaking of fluid. Review of Systems:   Pertinent items are noted in HPI Denies abnormal vaginal discharge w/ itching/odor/irritation, headaches, visual changes, shortness of breath, chest pain, abdominal pain, severe nausea/vomiting, or problems with urination or bowel movements unless otherwise stated above. Pertinent History Reviewed:  Reviewed past medical,surgical, social, obstetrical and family history.  Reviewed problem list, medications and allergies. Physical Assessment:   Vitals:   08/24/23 0835  BP: 131/87  Pulse: (!) 123  Weight: 154 lb (69.9 kg)  Body mass index is 30.08 kg/m.        Physical Examination:   General appearance: Well appearing, and in no distress  Mental status: Alert, oriented to person, place, and time  Skin: Warm & dry  Cardiovascular: Normal heart rate noted  Respiratory: Normal respiratory effort, no distress  Abdomen: Soft, gravid, nontender  Pelvic: Cervical exam performed  Dilation: Closed Effacement (%): Thick Station: Ballotable  Extremities: Edema: Trace  Fetal Status: Fetal Heart Rate (bpm): 135 Fundal Height: 30 cm Movement: Present    Results for orders placed or performed in visit on 08/24/23 (from the past 24 hours)  POCT Urinalysis Dipstick   Collection Time: 08/24/23  9:23 AM  Result Value Ref Range   Color, UA     Clarity, UA     Glucose, UA     Bilirubin, UA     Ketones, UA (MS)    Spec Grav, UA     Blood, UA     pH, UA     Protein, UA     Urobilinogen,  UA     Nitrite, UA     Leukocytes, UA     Appearance     Odor      Assessment & Plan:  1) Low-risk pregnancy G2P1001 at [redacted]w[redacted]d with an Estimated Date of Delivery: 10/29/23   2) N/V/D, since Saturday probably viral; no sick contacts; denies fever; has had diarrhea 2-3x/day and occ vomiting; UA w mod ketones; plan made for 1L LR w/ 25mg  Phenergan to be given @ infusion center; refill given on Zofran ODT  3) Hx gHTN, nl BPs so far   Meds:  Meds ordered this encounter  Medications   ondansetron (ZOFRAN-ODT) 4 MG disintegrating tablet    Sig: Take 1 tablet (4 mg total) by mouth every 8 (eight) hours as needed for nausea or vomiting.    Dispense:  30 tablet    Refill:  1    Supervising Provider:   Lazaro Arms [[id number]]   Labs/procedures today: IVF/Phen @ infusion center  Plan:  Continue routine obstetrical care   Reviewed: Preterm labor symptoms and general obstetric precautions including but not limited to vaginal bleeding, contractions, leaking of fluid and fetal movement were reviewed in detail with the patient.  All questions were answered. Didn't ask about home bp cuff. Check bp weekly, let us know if >140/90.   Follow-up: Return in about 2 weeks (around 09/07/2023) for LROB, in person.  Orders Placed This Encounter  Procedures   POCT Urinalysis Dipstick  [name] CNM 08/24/2023 11:59 AM

## 2023-08-24 NOTE — Progress Notes (Signed)
 Cannot hold down food or water since Sat. Diarrhea and pelvic cramps. Wants cx check.

## 2023-08-24 NOTE — Progress Notes (Signed)
 Diagnosis: Dehydration  Provider:   Cam Hai CNM  Procedure: IV Infusion  IV Type: Peripheral, IV Location: right wrist   Lactated Ringers with phenergan 25mg , Dose: 1000 ml  Infusion Start Time: 1131  Infusion Stop Time: 1240  Post Infusion IV Care: Observation period completed and Peripheral IV Discontinued  Discharge: Condition: Good, Destination: Home . AVS Provided  Performed by:  Arrie Senate, RN

## 2023-08-24 NOTE — Patient Instructions (Signed)
Sarah Rollins, thank you for choosing our office today! We appreciate the opportunity to meet your healthcare needs. You may receive a short survey by mail, e-mail, or through [employer]. If you are happy with your care we would appreciate if you could take just a few minutes to complete the survey questions. We read all of your comments and take your feedback very seriously. Thank you again for choosing our office.  Center for [employer] Team at [facility]  [facility] at [facility] ([address] [place], [place] [address]) Entrance C, located off of [address] Free 24/7 valet parking   CLASSES: Go to [email or web address] to register for classes (childbirth, breastfeeding, waterbirth, infant CPR, daddy bootcamp, etc.)  Call the office [phone]) or go to [facility] if: You begin to have strong, frequent contractions Your water breaks.  Sometimes it is a big gush of fluid, sometimes it is just a trickle that keeps getting your panties wet or running down your legs You have vaginal bleeding.  It is normal to have a small amount of spotting if your cervix was checked.  You don't feel your baby moving like normal.  If you don't, get you something to eat and drink and lay down and focus on feeling your baby move.   If your baby is still not moving like normal, you should call the office or go to [facility].  Call the office [phone]) or go to [facility] hospital for these signs of pre-eclampsia: Severe headache that does not go away with Tylenol Visual changes- seeing spots, double, blurred vision Pain under your right breast or upper abdomen that does not go away with Tums or heartburn medicine Nausea and/or vomiting Severe swelling in your hands, feet, and face   Tdap Vaccine It is recommended that you get the Tdap vaccine during the third trimester of EACH pregnancy to help protect your baby from getting pertussis (whooping cough) 27-36 weeks is the BEST time to do this  so that you can pass the protection on to your baby. During pregnancy is better than after pregnancy, but if you are unable to get it during pregnancy it will be offered at the hospital.  You can get this vaccine with [place], at the health department, your family doctor, or some local pharmacies Everyone who will be around your baby should also be up-to-date on their vaccines before the baby comes. Adults (who are not pregnant) only need 1 dose of Tdap during adulthood.   [facility] Pediatricians/Family Doctors [facility]): [address] Colette Ribas, [phone]           [facility]: [address], [phone]                [facility]): [address], [phone] (call to ask if accepting patients) [facility] Department: [address], [place], [phone]    [facility] Pediatricians/Family Doctors [facility]): [phone], [phone] [facility]: [address] [place], [phone] [facility]: [address], [phone]  [facility] Doctors  [facility]): [phone] [facility]: [address], [phone]   [facility] Doctors [facility]: [address], [phone]  [facility] Family Doctors  [facility]: [phone], [phone]  Home Blood Pressure Monitoring for Patients   Your provider has recommended that you check your  blood pressure (BP) at least once a week at home. If you do not have a blood pressure cuff at home, one will be provided for you. Contact your provider if you have not received your monitor within 1 week.   Helpful Tips for Accurate Home Blood Pressure Checks  Don't smoke, exercise, or drink caffeine 30 minutes before checking your BP Use the restroom before checking your BP (a full bladder can raise your  pressure) Relax in a comfortable upright chair Feet on the ground Left arm resting comfortably on a flat surface at the level of your heart Legs uncrossed Back supported Sit quietly and don't talk Place the cuff on your bare arm Adjust snuggly, so that only two fingertips can fit between your skin and the top of the cuff Check 2 readings separated by at least one minute Keep a log of your BP readings For a visual, please reference this diagram: http://ccnc.care/bpdiagram  Provider Name: [facility]     Phone: [phone]  Zone 1: ALL CLEAR  Continue to monitor your symptoms:  BP reading is less than 140 (top number) or less than 90 (bottom number)  No right upper stomach pain No headaches or seeing spots No feeling nauseated or throwing up No swelling in face and hands  Zone 2: CAUTION Call your doctor's office for any of the following:  BP reading is greater than 140 (top number) or greater than 90 (bottom number)  Stomach pain under your ribs in the middle or right side Headaches or seeing spots Feeling nauseated or throwing up Swelling in face and hands  Zone 3: EMERGENCY  Seek immediate medical care if you have any of the following:  BP reading is greater than160 (top number) or greater than 110 (bottom number) Severe headaches not improving with Tylenol Serious difficulty catching your breath Any worsening symptoms from Zone 2   Third Trimester of Pregnancy The third trimester is from week 29 through week 42, months 7 through 9. The third trimester is a time when the fetus is growing rapidly. At the end of the ninth month, the fetus is about 20 inches in length and weighs 6-10 pounds.  BODY CHANGES Your body goes through many changes during pregnancy. The changes vary from woman to woman.  Your weight will continue to increase. You can expect to gain 25-35 pounds (11-16 kg) by the end of the pregnancy. You may begin to get stretch marks on your hips, abdomen,  and breasts. You may urinate more often because the fetus is moving lower into your pelvis and pressing on your bladder. You may develop or continue to have heartburn as a result of your pregnancy. You may develop constipation because certain hormones are causing the muscles that push waste through your intestines to slow down. You may develop hemorrhoids or swollen, bulging veins (varicose veins). You may have pelvic pain because of the weight gain and pregnancy hormones relaxing your joints between the bones in your pelvis. Backaches may result from overexertion of the muscles supporting your posture. You may have changes in your hair. These can include thickening of your hair, rapid growth, and changes in texture. Some women also have hair loss during or after pregnancy, or hair that feels dry or thin. Your hair will most likely return to normal after your baby is born. Your breasts will continue to grow and be tender. A yellow discharge may leak from your breasts called colostrum. Your belly button may stick out. You may   feel short of breath because of your expanding uterus. You may notice the fetus "dropping," or moving lower in your abdomen. You may have a bloody mucus discharge. This usually occurs a few days to a week before labor begins. Your cervix becomes thin and soft (effaced) near your due date. WHAT TO EXPECT AT YOUR PRENATAL EXAMS  You will have prenatal exams every 2 weeks until week 36. Then, you will have weekly prenatal exams. During a routine prenatal visit: You will be weighed to make sure you and the fetus are growing normally. Your blood pressure is taken. Your abdomen will be measured to track your baby's growth. The fetal heartbeat will be listened to. Any test results from the previous visit will be discussed. You may have a cervical check near your due date to see if you have effaced. At around 36 weeks, your caregiver will check your cervix. At the same time, your  caregiver will also perform a test on the secretions of the vaginal tissue. This test is to determine if a type of bacteria, Group B streptococcus, is present. Your caregiver will explain this further. Your caregiver may ask you: What your birth plan is. How you are feeling. If you are feeling the baby move. If you have had any abnormal symptoms, such as leaking fluid, bleeding, severe headaches, or abdominal cramping. If you have any questions. Other tests or screenings that may be performed during your third trimester include: Blood tests that check for low iron levels (anemia). Fetal testing to check the health, activity level, and growth of the fetus. Testing is done if you have certain medical conditions or if there are problems during the pregnancy. FALSE LABOR You may feel small, irregular contractions that eventually go away. These are called Braxton Hicks contractions, or false labor. Contractions may last for hours, days, or even weeks before true labor sets in. If contractions come at regular intervals, intensify, or become painful, it is best to be seen by your caregiver.  SIGNS OF LABOR  Menstrual-like cramps. Contractions that are 5 minutes apart or less. Contractions that start on the top of the uterus and spread down to the lower abdomen and back. A sense of increased pelvic pressure or back pain. A watery or bloody mucus discharge that comes from the vagina. If you have any of these signs before the 37th week of pregnancy, call your caregiver right away. You need to go to the hospital to get checked immediately. HOME CARE INSTRUCTIONS  Avoid all smoking, herbs, alcohol, and unprescribed drugs. These chemicals affect the formation and growth of the baby. Follow your caregiver's instructions regarding medicine use. There are medicines that are either safe or unsafe to take during pregnancy. Exercise only as directed by your caregiver. Experiencing uterine cramps is a good sign to  stop exercising. Continue to eat regular, healthy meals. Wear a good support bra for breast tenderness. Do not use hot tubs, steam rooms, or saunas. Wear your seat belt at all times when driving. Avoid raw meat, uncooked cheese, cat litter boxes, and soil used by cats. These carry germs that can cause birth defects in the baby. Take your prenatal vitamins. Try taking a stool softener (if your caregiver approves) if you develop constipation. Eat more high-fiber foods, such as fresh vegetables or fruit and whole grains. Drink plenty of fluids to keep your urine clear or pale yellow. Take warm sitz baths to soothe any pain or discomfort caused by hemorrhoids. Use hemorrhoid cream if   your caregiver approves. If you develop varicose veins, wear support hose. Elevate your feet for 15 minutes, 3-4 times a day. Limit salt in your diet. Avoid heavy lifting, wear low heal shoes, and practice good posture. Rest a lot with your legs elevated if you have leg cramps or low back pain. Visit your dentist if you have not gone during your pregnancy. Use a soft toothbrush to brush your teeth and be gentle when you floss. A sexual relationship may be continued unless your caregiver directs you otherwise. Do not travel far distances unless it is absolutely necessary and only with the approval of your caregiver. Take prenatal classes to understand, practice, and ask questions about the labor and delivery. Make a trial run to the hospital. Pack your hospital bag. Prepare the baby's nursery. Continue to go to all your prenatal visits as directed by your caregiver. SEEK MEDICAL CARE IF: You are unsure if you are in labor or if your water has broken. You have dizziness. You have mild pelvic cramps, pelvic pressure, or nagging pain in your abdominal area. You have persistent nausea, vomiting, or diarrhea. You have a bad smelling vaginal discharge. You have pain with urination. SEEK IMMEDIATE MEDICAL CARE IF:  You  have a fever. You are leaking fluid from your vagina. You have spotting or bleeding from your vagina. You have severe abdominal cramping or pain. You have rapid weight loss or gain. You have shortness of breath with chest pain. You notice sudden or extreme swelling of your face, hands, ankles, feet, or legs. You have not felt your baby move in over an hour. You have severe headaches that do not go away with medicine. You have vision changes. Document Released: 05/18/2001 Document Revised: 05/29/2013 Document Reviewed: 07/25/2012 [facility] Patient Information 2015 [place], [place]. This information is not intended to replace advice given to you by your health care provider. Make sure you discuss any questions you have with your health care provider.

## 2023-09-07 ENCOUNTER — Ambulatory Visit: Admitting: Women's Health

## 2023-09-07 ENCOUNTER — Encounter: Payer: Self-pay | Admitting: Women's Health

## 2023-09-07 VITALS — BP 117/81 | HR 86 | Wt 162.0 lb

## 2023-09-07 DIAGNOSIS — Z3483 Encounter for supervision of other normal pregnancy, third trimester: Secondary | ICD-10-CM | POA: Diagnosis not present

## 2023-09-07 DIAGNOSIS — Z3A32 32 weeks gestation of pregnancy: Secondary | ICD-10-CM

## 2023-09-07 DIAGNOSIS — Z23 Encounter for immunization: Secondary | ICD-10-CM

## 2023-09-07 NOTE — Patient Instructions (Signed)
Sarah Rollins, thank you for choosing our office today! We appreciate the opportunity to meet your healthcare needs. You may receive a short survey by mail, e-mail, or through [employer]. If you are happy with your care we would appreciate if you could take just a few minutes to complete the survey questions. We read all of your comments and take your feedback very seriously. Thank you again for choosing our office.  Center for [employer] Team at [address] [facility]  [facility] at [facility] ([address] [place], [place] [address]) Entrance C, located off of [address] Free 24/7 valet parking   CLASSES: Go to [email or web address] to register for classes (childbirth, breastfeeding, waterbirth, infant CPR, daddy bootcamp, etc.)  Call the office [phone]) or go to [facility] if: You begin to have strong, frequent contractions Your water breaks.  Sometimes it is a big gush of fluid, sometimes it is just a trickle that keeps getting your panties wet or running down your legs You have vaginal bleeding.  It is normal to have a small amount of spotting if your cervix was checked.  You don't feel your baby moving like normal.  If you don't, get you something to eat and drink and lay down and focus on feeling your baby move.   If your baby is still not moving like normal, you should call the office or go to [facility].  Call the office [phone]) or go to [facility] hospital for these signs of pre-eclampsia: Severe headache that does not go away with Tylenol Visual changes- seeing spots, double, blurred vision Pain under your right breast or upper abdomen that does not go away with Tums or heartburn medicine Nausea and/or vomiting Severe swelling in your hands, feet, and face   Tdap Vaccine It is recommended that you get the Tdap vaccine during the third trimester of EACH pregnancy to help protect your baby from getting pertussis (whooping cough) 27-36 weeks is the BEST time to do this  so that you can pass the protection on to your baby. During pregnancy is better than after pregnancy, but if you are unable to get it during pregnancy it will be offered at the hospital.  You can get this vaccine with [place], at the health department, your family doctor, or some local pharmacies Everyone who will be around your baby should also be up-to-date on their vaccines before the baby comes. Adults (who are not pregnant) only need 1 dose of Tdap during adulthood.   [facility] Pediatricians/Family Doctors [facility]): [address] Colette Ribas, [phone]           [facility]: [address], [phone]                [facility] Medicine [facility]): [address], [phone] (call to ask if accepting patients) [facility] Department: [address], [place], [phone]    [facility] Pediatricians/Family Doctors [facility]): [phone] [address], [phone] [facility]: [address] [place], [phone] [facility] [place]: [address], [phone]  [facility] Doctors  [facility]): [phone] [facility]: [address] [phone]   [facility] Doctors [facility]: [address] [phone]  [facility] Family Doctors  [facility]: [phone], [phone]  Home Blood Pressure Monitoring for Patients   Your provider has recommended that you check your  blood pressure (BP) at least once a week at home. If you do not have a blood pressure cuff at home, one will be provided for you. Contact your provider if you have not received your monitor within 1 week.   Helpful Tips for Accurate Home Blood Pressure Checks  Don't smoke, exercise, or drink caffeine 30 minutes before checking your BP Use the restroom before checking your BP (a full bladder can raise your  pressure) Relax in a comfortable upright chair Feet on the ground Left arm resting comfortably on a flat surface at the level of your heart Legs uncrossed Back supported Sit quietly and don't talk Place the cuff on your bare arm Adjust snuggly, so that only two fingertips can fit between your skin and the top of the cuff Check 2 readings separated by at least one minute Keep a log of your BP readings For a visual, please reference this diagram: http://ccnc.care/bpdiagram  Provider Name: [facility]     Phone: [phone]  Zone 1: ALL CLEAR  Continue to monitor your symptoms:  BP reading is less than 140 (top number) or less than 90 (bottom number)  No right upper stomach pain No headaches or seeing spots No feeling nauseated or throwing up No swelling in face and hands  Zone 2: CAUTION Call your doctor's office for any of the following:  BP reading is greater than 140 (top number) or greater than 90 (bottom number)  Stomach pain under your ribs in the middle or right side Headaches or seeing spots Feeling nauseated or throwing up Swelling in face and hands  Zone 3: EMERGENCY  Seek immediate medical care if you have any of the following:  BP reading is greater than160 (top number) or greater than 110 (bottom number) Severe headaches not improving with Tylenol Serious difficulty catching your breath Any worsening symptoms from Zone 2  Preterm Labor and Birth Information  The normal length of a pregnancy is 39-41 weeks. Preterm labor is when labor starts before 37 completed weeks of pregnancy. What are the risk factors for preterm labor? Preterm labor is more likely to occur in women who: Have certain infections during pregnancy such as a bladder infection, sexually transmitted infection, or infection inside the uterus (chorioamnionitis). Have a shorter-than-normal cervix. Have gone into preterm labor before. Have had surgery on their cervix. Are younger than age 17  or older than age 35. Are African American. Are pregnant with twins or multiple babies (multiple gestation). Take street drugs or smoke while pregnant. Do not gain enough weight while pregnant. Became pregnant shortly after having been pregnant. What are the symptoms of preterm labor? Symptoms of preterm labor include: Cramps similar to those that can happen during a menstrual period. The cramps may happen with diarrhea. Pain in the abdomen or lower back. Regular uterine contractions that may feel like tightening of the abdomen. A feeling of increased pressure in the pelvis. Increased watery or bloody mucus discharge from the vagina. Water breaking (ruptured amniotic sac). Why is it important to recognize signs of preterm labor? It is important to recognize signs of preterm labor because babies who are born prematurely may not be fully developed. This can put them at an increased risk for: Long-term (chronic) heart and lung problems. Difficulty immediately after birth with regulating body systems, including blood sugar, body temperature, heart rate, and breathing rate. Bleeding in the brain. Cerebral palsy. Learning difficulties. Death. These risks are highest for babies who are born before 34 weeks   of pregnancy. How is preterm labor treated? Treatment depends on the length of your pregnancy, your condition, and the health of your baby. It may involve: Having a stitch (suture) placed in your cervix to prevent your cervix from opening too early (cerclage). Taking or being given medicines, such as: Hormone medicines. These may be given early in pregnancy to help support the pregnancy. Medicine to stop contractions. Medicines to help mature the baby's lungs. These may be prescribed if the risk of delivery is high. Medicines to prevent your baby from developing cerebral palsy. If the labor happens before 34 weeks of pregnancy, you may need to stay in the hospital. What should I do if I  think I am in preterm labor? If you think that you are going into preterm labor, call your health care provider right away. How can I prevent preterm labor in future pregnancies? To increase your chance of having a full-term pregnancy: Do not use any tobacco products, such as cigarettes, chewing tobacco, and e-cigarettes. If you need help quitting, ask your health care provider. Do not use street drugs or medicines that have not been prescribed to you during your pregnancy. Talk with your health care provider before taking any herbal supplements, even if you have been taking them regularly. Make sure you gain a healthy amount of weight during your pregnancy. Watch for infection. If you think that you might have an infection, get it checked right away. Make sure to tell your health care provider if you have gone into preterm labor before. This information is not intended to replace advice given to you by your health care provider. Make sure you discuss any questions you have with your health care provider. Document Revised: 09/15/2018 Document Reviewed: 10/15/2015 Elsevier Patient Education  2020 Elsevier Inc.   

## 2023-09-07 NOTE — Progress Notes (Signed)
 LOW-RISK PREGNANCY VISIT Patient name: Sarah Rollins MRN [id number]  Date of birth: 20-Sep-2000 Chief Complaint:   Routine Prenatal Visit  History of Present Illness:   Sarah Rollins is a 23 y.o. G56P1001 female at [redacted]w[redacted]d with an Estimated Date of Delivery: 10/29/23 being seen today for ongoing management of a low-risk pregnancy.   Today she reports no complaints. Contractions: Irregular. Vag. Bleeding: None.  Movement: Present. denies leaking of fluid.     08/24/2023   10:42 AM 05/16/2023   11:37 AM 07/23/2022    9:03 AM 04/23/2022    9:57 AM 03/30/2022    3:29 PM  Depression screen PHQ 2/9  Decreased Interest 0 3 0 2 1  Down, Depressed, Hopeless 0 3 1 1 2   PHQ - 2 Score 0 6 1 3 3   Altered sleeping  2 3 3 1   Tired, decreased energy  3 3 2 2   Change in appetite  2 1 1 2   Feeling bad or failure about yourself   3 2 2 1   Trouble concentrating  2 3 2  0  Moving slowly or fidgety/restless  1 3 3  0  Suicidal thoughts  0 0 0 0  PHQ-9 Score  19 16 16 9   Difficult doing work/chores   Extremely dIfficult Somewhat difficult Somewhat difficult        05/16/2023   11:37 AM 07/23/2022    9:05 AM 04/23/2022    9:59 AM 03/30/2022    3:30 PM  GAD 7 : Generalized Anxiety Score  Nervous, Anxious, on Edge 3 3 3 1   Control/stop worrying 3 3 3 1   Worry too much - different things 3 3 3 2   Trouble relaxing 3 2 1 1   Restless 3 2 3 1   Easily annoyed or irritable 3 3 3 3   Afraid - awful might happen 3 2 2 1   Total GAD 7 Score 21 18 18 10   Anxiety Difficulty  Very difficult Somewhat difficult Somewhat difficult      Review of Systems:   Pertinent items are noted in HPI Denies abnormal vaginal discharge w/ itching/odor/irritation, headaches, visual changes, shortness of breath, chest pain, abdominal pain, severe nausea/vomiting, or problems with urination or bowel movements unless otherwise stated above. Pertinent History Reviewed:  Reviewed past medical,surgical, social, obstetrical and family  history.  Reviewed problem list, medications and allergies. Physical Assessment:   Vitals:   09/07/23 0958  BP: 117/81  Pulse: 86  Weight: 162 lb (73.5 kg)  Body mass index is 31.64 kg/m.        Physical Examination:   General appearance: Well appearing, and in no distress  Mental status: Alert, oriented to person, place, and time  Skin: Warm & dry  Cardiovascular: Normal heart rate noted  Respiratory: Normal respiratory effort, no distress  Abdomen: Soft, gravid, nontender  Pelvic: Cervical exam deferred         Extremities: Edema: Trace  Fetal Status: Fetal Heart Rate (bpm): 130 Fundal Height: 32 cm Movement: Present    Chaperone: N/A No results found for this or any previous visit (from the past 24 hours).  Assessment & Plan:  1) Low-risk pregnancy G2P1001 at [redacted]w[redacted]d with an Estimated Date of Delivery: 10/29/23   2) H/O GHTN, ASA, check bp's weekly, if >140/90 or pre-e s/s, let us know/seek care  3) Dep/anx> on meds, doing well   Meds: No orders of the defined types were placed in this encounter.  Labs/procedures today: Tdap  Plan:  Continue routine  obstetrical care  Next visit: prefers online    Reviewed: Preterm labor symptoms and general obstetric precautions including but not limited to vaginal bleeding, contractions, leaking of fluid and fetal movement were reviewed in detail with the patient.  All questions were answered. Does have home bp cuff. Office bp cuff given: not applicable. Check bp weekly, let us know if consistently >140 and/or >90.  Follow-up: Return in about 2 weeks (around 09/21/2023) for LROB, CNM, MyChart Video.  Future Appointments  Date Time Provider Department Center  09/21/2023  2:10 PM Sue Lush, FNP [facility]    No orders of the defined types were placed in this encounter.  Cheral Marker CNM, [facility] 09/07/2023 10:13 AM

## 2023-09-09 ENCOUNTER — Encounter: Payer: Self-pay | Admitting: Women's Health

## 2023-09-09 ENCOUNTER — Other Ambulatory Visit: Payer: Self-pay | Admitting: Obstetrics & Gynecology

## 2023-09-09 MED ORDER — METOCLOPRAMIDE HCL 10 MG PO TABS: 10.0000 mg | ORAL_TABLET | Freq: Three times a day (TID) | ORAL | 1 refills | Status: DC | PRN

## 2023-09-09 NOTE — Progress Notes (Signed)
 Refill for Reglan  Myna Hidalgo, DO Attending Obstetrician & Gynecologist, [facility] for [facility], [facility]

## 2023-09-15 ENCOUNTER — Other Ambulatory Visit: Payer: Self-pay

## 2023-09-15 ENCOUNTER — Encounter (HOSPITAL_COMMUNITY): Payer: Self-pay | Admitting: Obstetrics & Gynecology

## 2023-09-15 ENCOUNTER — Inpatient Hospital Stay (HOSPITAL_COMMUNITY)
Admission: AD | Admit: 2023-09-15 | Discharge: 2023-09-15 | Disposition: A | Discharge status: Home / Self Care | Attending: Obstetrics & Gynecology | Admitting: Obstetrics & Gynecology

## 2023-09-15 DIAGNOSIS — O99613 Diseases of the digestive system complicating pregnancy, third trimester: Secondary | ICD-10-CM | POA: Diagnosis not present

## 2023-09-15 DIAGNOSIS — O4700 False labor before 37 completed weeks of gestation, unspecified trimester: Secondary | ICD-10-CM

## 2023-09-15 DIAGNOSIS — O4703 False labor before 37 completed weeks of gestation, third trimester: Secondary | ICD-10-CM | POA: Diagnosis present

## 2023-09-15 DIAGNOSIS — O219 Vomiting of pregnancy, unspecified: Secondary | ICD-10-CM | POA: Insufficient documentation

## 2023-09-15 DIAGNOSIS — O133 Gestational [pregnancy-induced] hypertension without significant proteinuria, third trimester: Secondary | ICD-10-CM | POA: Diagnosis not present

## 2023-09-15 DIAGNOSIS — R197 Diarrhea, unspecified: Secondary | ICD-10-CM

## 2023-09-15 DIAGNOSIS — Z3A33 33 weeks gestation of pregnancy: Secondary | ICD-10-CM | POA: Diagnosis not present

## 2023-09-15 DIAGNOSIS — K529 Noninfective gastroenteritis and colitis, unspecified: Secondary | ICD-10-CM | POA: Insufficient documentation

## 2023-09-15 LAB — URINALYSIS, ROUTINE W REFLEX MICROSCOPIC
Bilirubin Urine: NEGATIVE
Glucose, UA: NEGATIVE mg/dL
Hgb urine dipstick: NEGATIVE
Ketones, ur: NEGATIVE mg/dL
Nitrite: NEGATIVE
Protein, ur: 30 mg/dL — AB
Specific Gravity, Urine: 1.021 (ref 1.005–1.030)
pH: 5 (ref 5.0–8.0)

## 2023-09-15 LAB — COMPREHENSIVE METABOLIC PANEL WITH GFR
ALT: 9 U/L (ref 0–44)
AST: 15 U/L (ref 15–41)
Albumin: 3.1 g/dL — ABNORMAL LOW (ref 3.5–5.0)
Alkaline Phosphatase: 92 U/L (ref 38–126)
Anion gap: 11 (ref 5–15)
BUN: <5 mg/dL — ABNORMAL LOW (ref 6–20)
CO2: 24 mmol/L (ref 22–32)
Calcium: 9.2 mg/dL (ref 8.9–10.3)
Chloride: 99 mmol/L (ref 98–111)
Creatinine, Ser: 0.53 mg/dL (ref 0.44–1.00)
GFR, Estimated: >60 mL/min (ref >60)
Glucose, Bld: 85 mg/dL (ref 70–99)
Potassium: 3.5 mmol/L (ref 3.5–5.1)
Sodium: 134 mmol/L — ABNORMAL LOW (ref 135–145)
Total Bilirubin: 0.5 mg/dL (ref 0.0–1.2)
Total Protein: 6.6 g/dL (ref 6.5–8.1)

## 2023-09-15 LAB — CBC
HCT: 35.4 % — ABNORMAL LOW (ref 36.0–46.0)
Hemoglobin: 11.9 g/dL — ABNORMAL LOW (ref 12.0–15.0)
MCH: 32.7 pg (ref 26.0–34.0)
MCHC: 33.6 g/dL (ref 30.0–36.0)
MCV: 97.3 fL (ref 80.0–100.0)
Platelets: 241 10*3/uL (ref 150–400)
RBC: 3.64 MIL/uL — ABNORMAL LOW (ref 3.87–5.11)
RDW: 12.2 % (ref 11.5–15.5)
WBC: 11.8 10*3/uL — ABNORMAL HIGH (ref 4.0–10.5)
nRBC: 0 % (ref 0.0–0.2)

## 2023-09-15 LAB — MAGNESIUM: Magnesium: 1.5 mg/dL — ABNORMAL LOW (ref 1.7–2.4)

## 2023-09-15 MED ORDER — ONDANSETRON HCL 4 MG/2ML IJ SOLN
4.0000 mg | Freq: Once | INTRAMUSCULAR | Status: AC
  Administered 2023-09-15: 4 mg via INTRAVENOUS
  Filled 2023-09-15: qty 2

## 2023-09-15 MED ORDER — DICYCLOMINE HCL 10 MG PO CAPS
20.0000 mg | ORAL_CAPSULE | Freq: Once | ORAL | Status: AC
  Administered 2023-09-15: 20 mg via ORAL
  Filled 2023-09-15: qty 2

## 2023-09-15 MED ORDER — LACTATED RINGERS IV BOLUS
1000.0000 mL | Freq: Once | INTRAVENOUS | Status: AC
  Administered 2023-09-15: 1000 mL via INTRAVENOUS

## 2023-09-15 MED ORDER — DICYCLOMINE HCL 20 MG PO TABS: 20.0000 mg | ORAL_TABLET | Freq: Two times a day (BID) | ORAL | 0 refills | Status: DC | PRN

## 2023-09-15 NOTE — MAU Provider Note (Signed)
 Chief Complaint:  Contractions, Emesis, and Diarrhea   HPI   Event Date/Time   First Provider Initiated Contact with Patient 09/15/23 1918      Sarah Rollins is a 23 y.o. G2P1001 at [redacted]w[redacted]d who presents to maternity admissions reporting c/o watery diarrhea x 48 hours and vomiting twice x the last 24 hours having difficulty with PO food fluids. She noticed last night that she started to have ctx and was uncomfortable and she is concerned for PTL. She denies any fever, chills, and offers no other OB c/o. No VB, LOF and reports good FM's  [facility]   Past Medical History:  Diagnosis Date   ADHD    Anxiety    Depression    Pregnancy induced hypertension    Vaginal Pap smear, abnormal    OB History  Gravida Para Term Preterm AB Living  2 1 1  0 0 1  SAB IAB Ectopic Multiple Live Births  0 0 0 0 1    # Outcome Date GA Lbr Len/2nd Weight Sex Type Anes PTL Lv  2 Current           1 Term 10/09/20 [redacted]w[redacted]d 16:54 / 01:34 3033 g M Vag-Spont EPI N LIV     Complications: Gestational hypertension   Past Surgical History:  Procedure Laterality Date   WISDOM TOOTH EXTRACTION     Family History  Problem Relation Age of Onset   Depression Sister    Anxiety disorder Sister    Hypertension Mother    Heart Problems Mother    Colon cancer Maternal Grandfather    Breast cancer Paternal Grandmother    Social History   Tobacco Use   Smoking status: Never   Smokeless tobacco: Never  Vaping Use   Vaping status: Never Used  Substance Use Topics   Alcohol use: Not Currently   Drug use: Not Currently    Types: Marijuana    Comment: daily   Allergies  Allergen Reactions   Ciprofloxacin Rash   Medications Prior to Admission  Medication Sig Dispense Refill Last Dose/Taking   busPIRone (BUSPAR) 10 MG tablet TAKE 1 TABLET BY MOUTH TWICE A DAY 180 tablet 2 09/15/2023 Morning   metoCLOPramide (REGLAN) 10 MG tablet Take 1 tablet (10 mg total) by mouth every 8 (eight) hours as  needed. 30 tablet 1 09/15/2023 Morning   ondansetron (ZOFRAN-ODT) 4 MG disintegrating tablet Take 1 tablet (4 mg total) by mouth every 8 (eight) hours as needed for nausea or vomiting. 30 tablet 1 09/15/2023 Morning   Prenatal Vit-Fe Fumarate-FA (PRENATAL VITAMIN PO) Take by mouth.   09/15/2023 Morning   sertraline (ZOLOFT) 100 MG tablet TAKE 1 EVERY DAY 180 tablet 3 09/15/2023 Morning   aspirin EC 81 MG tablet Take 2 tablets (162 mg total) by mouth daily. (Patient not taking: Reported on 09/07/2023) 60 tablet 6    Blood Pressure Monitor MISC For regular home bp monitoring during pregnancy 1 each 0    doxylamine, Sleep, (UNISOM) 25 MG tablet Take 1 tablet (25 mg total) by mouth 2 (two) times daily. (Patient not taking: Reported on 07/05/2023) 60 tablet 0    famotidine (PEPCID) 20 MG tablet Take 1 tablet (20 mg total) by mouth 2 (two) times daily. (Patient not taking: Reported on 05/16/2023) 60 tablet 0    pyridOXINE (VITAMIN B6) 50 MG tablet Take 1 tablet (50 mg total) by mouth 2 (two) times daily. (Patient not taking: Reported on 05/16/2023) 60 tablet 0  I have reviewed patient's Past Medical Hx, Surgical Hx, Family Hx, Social Hx, medications and allergies.   ROS  Pertinent items noted in HPI and remainder of comprehensive ROS otherwise negative.   PHYSICAL EXAM  Patient Vitals for the past 24 hrs:  BP Temp Temp src Pulse SpO2 Height Weight  09/15/23 1915 111/72 -- -- (!) 102 98 % -- --  09/15/23 1903 112/83 97.6 F (36.4 C) Oral (!) 137 100 % -- --  09/15/23 1856 -- -- -- -- -- 5' (1.524 m) 72.4 kg    Constitutional: Well-developed, well-nourished female in no acute distress.  Cardiovascular: tachycardia , normal rhythm, warm and well-perfused Respiratory: normal effort, no problems with respiration noted GI: Abd soft, non-tender, gravid, no guarding, no rebound, no rigidity on palpation MS: Extremities nontender, no edema, normal ROM Neurologic: Alert and oriented x 4.  GU: no CVA  tenderness Pelvic:   Dilation: 1 Effacement (%): Thick Cervical Position: Posterior Exam by:: Marcell Barlow, NP  Fetal Tracing: Cat 1 reactive @ 1937 Baseline: 140 Variability: moderate  Accelerations: present Decelerations: absent Toco: irregular ctx's   Labs: Results for orders placed or performed during the hospital encounter of 09/15/23 (from the past 24 hours)  Urinalysis, Routine w reflex microscopic -Urine, Random     Status: Abnormal   Collection Time: 09/15/23  7:44 PM  Result Value Ref Range   Color, Urine AMBER (A) YELLOW   APPearance CLOUDY (A) CLEAR   Specific Gravity, Urine 1.021 1.005 - 1.030   pH 5.0 5.0 - 8.0   Glucose, UA NEGATIVE NEGATIVE mg/dL   Hgb urine dipstick NEGATIVE NEGATIVE   Bilirubin Urine NEGATIVE NEGATIVE   Ketones, ur NEGATIVE NEGATIVE mg/dL   Protein, ur 30 (A) NEGATIVE mg/dL   Nitrite NEGATIVE NEGATIVE   Leukocytes,Ua SMALL (A) NEGATIVE   RBC / HPF 0-5 0 - 5 RBC/hpf   WBC, UA 6-10 0 - 5 WBC/hpf   Bacteria, UA RARE (A) NONE SEEN   Squamous Epithelial / HPF 11-20 0 - 5 /HPF   Mucus PRESENT    Hyaline Casts, UA PRESENT   CBC     Status: Abnormal   Collection Time: 09/15/23  7:55 PM  Result Value Ref Range   WBC 11.8 (H) 4.0 - 10.5 K/uL   RBC 3.64 (L) 3.87 - 5.11 MIL/uL   Hemoglobin 11.9 (L) 12.0 - 15.0 g/dL   HCT 16.1 (L) 09.6 - 04.5 %   MCV 97.3 80.0 - 100.0 fL   MCH 32.7 26.0 - 34.0 pg   MCHC 33.6 30.0 - 36.0 g/dL   RDW 40.9 81.1 - 91.4 %   Platelets 241 150 - 400 K/uL   nRBC 0.0 0.0 - 0.2 %    MDM & MAU COURSE  MDM:  HIGH  Cervix 1/Thick IVF's infusing Patient reports feeling much better after IVF's  Likely gastroenteritis No evidence of PTL at this time  MAU Course: Orders Placed This Encounter  Procedures   CBC   Comprehensive metabolic panel   Magnesium   Urinalysis, Routine w reflex microscopic -Urine, Random   Discharge patient Discharge disposition: 01-Home or Self Care; Discharge patient date: 09/15/2023    Meds ordered this encounter  Medications   lactated ringers bolus 1,000 mL   ondansetron (ZOFRAN) injection 4 mg   dicyclomine (BENTYL) capsule 20 mg   dicyclomine (BENTYL) 20 MG tablet    Sig: Take 1 tablet (20 mg total) by mouth 2 (two) times daily as needed for spasms.  Dispense:  20 tablet    Refill:  0    Supervising Provider:   Samara Snide   I have reviewed the patient chart and performed the physical exam . I have ordered & interpreted the lab results and reviewed and interpreted the NST Medications ordered as stated below.  A/P as described below.  Counseling and education provided and patient agreeable  with plan as described below. Verbalized understanding.    ASSESSMENT   1. Nausea and vomiting during pregnancy   2. [redacted] weeks gestation of pregnancy   3. Diarrhea, unspecified type   4. Premature uterine contractions   5. Gastroenteritis     PLAN  Discharge home in stable condition with return precautions.   Future Appointments  Date Time Provider Department Center  09/21/2023  2:10 PM Sue Lush, FNP [facility]     See AVS for full description of information given to the patient including both verbal and written. Patient verbalized understanding and agrees with the plan as described above.    Allergies as of 09/15/2023       Reactions   Ciprofloxacin Rash        Medication List     TAKE these medications    aspirin EC 81 MG tablet Take 2 tablets (162 mg total) by mouth daily.   Blood Pressure Monitor Misc For regular home bp monitoring during pregnancy   busPIRone 10 MG tablet Commonly known as: BUSPAR TAKE 1 TABLET BY MOUTH TWICE A DAY   dicyclomine 20 MG tablet Commonly known as: BENTYL Take 1 tablet (20 mg total) by mouth 2 (two) times daily as needed for spasms.   doxylamine (Sleep) 25 MG tablet Commonly known as: UNISOM Take 1 tablet (25 mg total) by mouth 2 (two) times daily.   famotidine 20 MG tablet Commonly  known as: Pepcid Take 1 tablet (20 mg total) by mouth 2 (two) times daily.   metoCLOPramide 10 MG tablet Commonly known as: REGLAN Take 1 tablet (10 mg total) by mouth every 8 (eight) hours as needed.   ondansetron 4 MG disintegrating tablet Commonly known as: ZOFRAN-ODT Take 1 tablet (4 mg total) by mouth every 8 (eight) hours as needed for nausea or vomiting.   PRENATAL VITAMIN PO Take by mouth.   pyridOXINE 50 MG tablet Commonly known as: VITAMIN B6 Take 1 tablet (50 mg total) by mouth 2 (two) times daily.   sertraline 100 MG tablet Commonly known as: ZOLOFT TAKE 1 EVERY DAY       Marcell Barlow, MSN, [facility], Center for [employer]

## 2023-09-15 NOTE — MAU Note (Addendum)
 Sarah Rollins is a 23 y.o. at [redacted]w[redacted]d here in MAU reporting: she reports she's been having irregular ctx since last night. This RN palpated a mild contraction. Patient also reports 2 emesis occurrences as well as 4 occurrences of diarrhea. She reports she took reglan and zofran this morning with no relief. Endorses +FM, denies VB and LOF.    Onset of complaint: lastnight  Pain score: 5 ctx (abd lower;back) Vitals:   09/15/23 1903  BP: 112/83  Pulse: (!) 137  Temp: 97.6 F (36.4 C)  SpO2: 100%     FHT: 141 initial external  Lab orders placed from triage: urine collected

## 2023-09-18 ENCOUNTER — Other Ambulatory Visit: Payer: Self-pay

## 2023-09-18 ENCOUNTER — Encounter (HOSPITAL_COMMUNITY): Payer: Self-pay | Admitting: Obstetrics & Gynecology

## 2023-09-18 ENCOUNTER — Inpatient Hospital Stay (HOSPITAL_COMMUNITY)
Admission: AD | Admit: 2023-09-18 | Discharge: 2023-09-18 | Disposition: A | Discharge status: Home / Self Care | Payer: Self-pay | Attending: Obstetrics & Gynecology | Admitting: Obstetrics & Gynecology

## 2023-09-18 ENCOUNTER — Inpatient Hospital Stay (HOSPITAL_COMMUNITY)

## 2023-09-18 DIAGNOSIS — R000 Tachycardia, unspecified: Secondary | ICD-10-CM | POA: Diagnosis not present

## 2023-09-18 DIAGNOSIS — O219 Vomiting of pregnancy, unspecified: Secondary | ICD-10-CM

## 2023-09-18 DIAGNOSIS — Z3A34 34 weeks gestation of pregnancy: Secondary | ICD-10-CM | POA: Diagnosis not present

## 2023-09-18 DIAGNOSIS — O212 Late vomiting of pregnancy: Secondary | ICD-10-CM | POA: Diagnosis present

## 2023-09-18 LAB — CBC WITH DIFFERENTIAL/PLATELET
Abs Immature Granulocytes: 0.2 10*3/uL — ABNORMAL HIGH (ref 0.00–0.07)
Basophils Absolute: 0.1 10*3/uL (ref 0.0–0.1)
Basophils Relative: 0 %
Eosinophils Absolute: 0 10*3/uL (ref 0.0–0.5)
Eosinophils Relative: 0 %
HCT: 35 % — ABNORMAL LOW (ref 36.0–46.0)
Hemoglobin: 12.1 g/dL (ref 12.0–15.0)
Immature Granulocytes: 2 %
Lymphocytes Relative: 16 %
Lymphs Abs: 2 10*3/uL (ref 0.7–4.0)
MCH: 32.9 pg (ref 26.0–34.0)
MCHC: 34.6 g/dL (ref 30.0–36.0)
MCV: 95.1 fL (ref 80.0–100.0)
Monocytes Absolute: 0.7 10*3/uL (ref 0.1–1.0)
Monocytes Relative: 5 %
Neutro Abs: 9.4 10*3/uL — ABNORMAL HIGH (ref 1.7–7.7)
Neutrophils Relative %: 77 %
Platelets: 254 10*3/uL (ref 150–400)
RBC: 3.68 MIL/uL — ABNORMAL LOW (ref 3.87–5.11)
RDW: 12.3 % (ref 11.5–15.5)
WBC: 12.3 10*3/uL — ABNORMAL HIGH (ref 4.0–10.5)
nRBC: 0 % (ref 0.0–0.2)

## 2023-09-18 LAB — RAPID URINE DRUG SCREEN, HOSP PERFORMED
Amphetamines: NOT DETECTED
Barbiturates: NOT DETECTED
Benzodiazepines: NOT DETECTED
Cocaine: NOT DETECTED
Opiates: NOT DETECTED
Tetrahydrocannabinol: POSITIVE — AB

## 2023-09-18 LAB — COMPREHENSIVE METABOLIC PANEL WITH GFR
ALT: 12 U/L (ref 0–44)
AST: 20 U/L (ref 15–41)
Albumin: 3.3 g/dL — ABNORMAL LOW (ref 3.5–5.0)
Alkaline Phosphatase: 83 U/L (ref 38–126)
Anion gap: 12 (ref 5–15)
BUN: 5 mg/dL — ABNORMAL LOW (ref 6–20)
CO2: 23 mmol/L (ref 22–32)
Calcium: 9.3 mg/dL (ref 8.9–10.3)
Chloride: 99 mmol/L (ref 98–111)
Creatinine, Ser: 0.53 mg/dL (ref 0.44–1.00)
GFR, Estimated: >60 mL/min (ref >60)
Glucose, Bld: 99 mg/dL (ref 70–99)
Potassium: 3.4 mmol/L — ABNORMAL LOW (ref 3.5–5.1)
Sodium: 134 mmol/L — ABNORMAL LOW (ref 135–145)
Total Bilirubin: 0.7 mg/dL (ref 0.0–1.2)
Total Protein: 7.2 g/dL (ref 6.5–8.1)

## 2023-09-18 LAB — URINALYSIS, ROUTINE W REFLEX MICROSCOPIC
Bilirubin Urine: NEGATIVE
Glucose, UA: NEGATIVE mg/dL
Hgb urine dipstick: NEGATIVE
Ketones, ur: 5 mg/dL — AB
Leukocytes,Ua: NEGATIVE
Nitrite: NEGATIVE
Protein, ur: 30 mg/dL — AB
Specific Gravity, Urine: 1.021 (ref 1.005–1.030)
pH: 6 (ref 5.0–8.0)

## 2023-09-18 LAB — LIPASE, BLOOD: Lipase: 28 U/L (ref 11–51)

## 2023-09-18 LAB — AMYLASE: Amylase: 79 U/L (ref 28–100)

## 2023-09-18 MED ORDER — FAMOTIDINE IN NACL 20-0.9 MG/50ML-% IV SOLN
20.0000 mg | Freq: Once | INTRAVENOUS | Status: AC
  Administered 2023-09-18: 20 mg via INTRAVENOUS
  Filled 2023-09-18: qty 50

## 2023-09-18 MED ORDER — SCOPOLAMINE 1 MG/3DAYS TD PT72
1.0000 | MEDICATED_PATCH | TRANSDERMAL | Status: DC
  Administered 2023-09-18: 1.5 mg via TRANSDERMAL
  Filled 2023-09-18: qty 1

## 2023-09-18 MED ORDER — ACETAMINOPHEN 500 MG PO TABS
1000.0000 mg | ORAL_TABLET | Freq: Once | ORAL | Status: AC
  Administered 2023-09-18: 1000 mg via ORAL
  Filled 2023-09-18: qty 2

## 2023-09-18 MED ORDER — SODIUM CHLORIDE 0.9 % IV SOLN
8.0000 mg | Freq: Once | INTRAVENOUS | Status: AC
  Administered 2023-09-18: 8 mg via INTRAVENOUS
  Filled 2023-09-18: qty 4

## 2023-09-18 MED ORDER — SCOPOLAMINE 1 MG/3DAYS TD PT72: 1.0000 | MEDICATED_PATCH | TRANSDERMAL | Status: DC

## 2023-09-18 MED ORDER — LACTATED RINGERS IV BOLUS
1000.0000 mL | Freq: Once | INTRAVENOUS | Status: AC
  Administered 2023-09-18: 1000 mL via INTRAVENOUS

## 2023-09-18 NOTE — MAU Provider Note (Signed)
 History     CSN: [id number]  Arrival date and time: 09/18/23 9147   Event Date/Time   First Provider Initiated Contact with Patient 09/18/23 1016      Chief Complaint  Patient presents with   Contractions   Tachycardia   HPI  Ms.Sarah Rollins is a 23 y.o. female G2P1001 @ [redacted]w[redacted]d here with continued Tachycardia, nausea, SOB, and contractions. She was seen a few days ago in MAU with the same symptoms. She reports her HR was in the 130's during her last visit. She was diagnosed with a GI virus and sent home with nausea meds. She is concerned because this N/V has been ongoing her entire pregnancy.   She was previously using marijuana and quit.   No VB, or leaking of fluid. She does not feel anxious, although she does have a history anxiety and depression.   She has no diarrhea, no other symptoms suggestive of a GI virus at this time.  No other family members in her home have or had a GI virus.   OB History     Gravida  2   Para  1   Term  1   Preterm  0   AB  0   Living  1      SAB  0   IAB  0   Ectopic  0   Multiple  0   Live Births  1           Past Medical History:  Diagnosis Date   ADHD    Anxiety    Depression    Pregnancy induced hypertension    Vaginal Pap smear, abnormal     Past Surgical History:  Procedure Laterality Date   WISDOM TOOTH EXTRACTION      Family History  Problem Relation Age of Onset   Depression Sister    Anxiety disorder Sister    Hypertension Mother    Heart Problems Mother    Colon cancer Maternal Grandfather    Breast cancer Paternal Grandmother     Social History   Tobacco Use   Smoking status: Never   Smokeless tobacco: Never  Vaping Use   Vaping status: Never Used  Substance Use Topics   Alcohol use: Not Currently   Drug use: Not Currently    Types: Marijuana    Comment: daily    Allergies:  Allergies  Allergen Reactions   Ciprofloxacin Rash    Medications Prior to Admission  Medication Sig  Dispense Refill Last Dose/Taking   busPIRone (BUSPAR) 10 MG tablet TAKE 1 TABLET BY MOUTH TWICE A DAY 180 tablet 2 09/17/2023   dicyclomine (BENTYL) 20 MG tablet Take 1 tablet (20 mg total) by mouth 2 (two) times daily as needed for spasms. 20 tablet 0 Past Week   metoCLOPramide (REGLAN) 10 MG tablet Take 1 tablet (10 mg total) by mouth every 8 (eight) hours as needed. 30 tablet 1 09/17/2023   ondansetron (ZOFRAN-ODT) 4 MG disintegrating tablet Take 1 tablet (4 mg total) by mouth every 8 (eight) hours as needed for nausea or vomiting. 30 tablet 1 09/17/2023   Prenatal Vit-Fe Fumarate-FA (PRENATAL VITAMIN PO) Take by mouth.   09/17/2023   sertraline (ZOLOFT) 100 MG tablet TAKE 1 EVERY DAY 180 tablet 3 09/17/2023   aspirin EC 81 MG tablet Take 2 tablets (162 mg total) by mouth daily. (Patient not taking: Reported on 09/07/2023) 60 tablet 6    Blood Pressure Monitor MISC For regular home bp monitoring during  pregnancy 1 each 0    doxylamine, Sleep, (UNISOM) 25 MG tablet Take 1 tablet (25 mg total) by mouth 2 (two) times daily. (Patient not taking: Reported on 07/05/2023) 60 tablet 0    famotidine (PEPCID) 20 MG tablet Take 1 tablet (20 mg total) by mouth 2 (two) times daily. (Patient not taking: Reported on 05/16/2023) 60 tablet 0    pyridOXINE (VITAMIN B6) 50 MG tablet Take 1 tablet (50 mg total) by mouth 2 (two) times daily. (Patient not taking: Reported on 05/16/2023) 60 tablet 0    Results for orders placed or performed during the hospital encounter of 09/18/23 (from the past 48 hours)  CBC with Differential/Platelet     Status: Abnormal   Collection Time: 09/18/23 10:29 AM  Result Value Ref Range   WBC 12.3 (H) 4.0 - 10.5 K/uL   RBC 3.68 (L) 3.87 - 5.11 MIL/uL   Hemoglobin 12.1 12.0 - 15.0 g/dL   HCT 40.9 (L) 81.1 - 91.4 %   MCV 95.1 80.0 - 100.0 fL   MCH 32.9 26.0 - 34.0 pg   MCHC 34.6 30.0 - 36.0 g/dL   RDW 78.2 95.6 - 21.3 %   Platelets 254 150 - 400 K/uL   nRBC 0.0 0.0 - 0.2 %   Neutrophils  Relative % 77 %   Neutro Abs 9.4 (H) 1.7 - 7.7 K/uL   Lymphocytes Relative 16 %   Lymphs Abs 2.0 0.7 - 4.0 K/uL   Monocytes Relative 5 %   Monocytes Absolute 0.7 0.1 - 1.0 K/uL   Eosinophils Relative 0 %   Eosinophils Absolute 0.0 0.0 - 0.5 K/uL   Basophils Relative 0 %   Basophils Absolute 0.1 0.0 - 0.1 K/uL   Immature Granulocytes 2 %   Abs Immature Granulocytes 0.20 (H) 0.00 - 0.07 K/uL    Comment: Performed at [facility], [address] [place], [place] [address]  Comprehensive metabolic panel     Status: Abnormal   Collection Time: 09/18/23 10:29 AM  Result Value Ref Range   Sodium 134 (L) 135 - 145 mmol/L   Potassium 3.4 (L) 3.5 - 5.1 mmol/L   Chloride 99 98 - 111 mmol/L   CO2 23 22 - 32 mmol/L   Glucose, Bld 99 70 - 99 mg/dL    Comment: Glucose reference range applies only to samples taken after fasting for at least 8 hours.   BUN 5 (L) 6 - 20 mg/dL   Creatinine, Ser 8.46 0.44 - 1.00 mg/dL   Calcium 9.3 8.9 - 96.2 mg/dL   Total Protein 7.2 6.5 - 8.1 g/dL   Albumin 3.3 (L) 3.5 - 5.0 g/dL   AST 20 15 - 41 U/L   ALT 12 0 - 44 U/L   Alkaline Phosphatase 83 38 - 126 U/L   Total Bilirubin 0.7 0.0 - 1.2 mg/dL   GFR, Estimated >95 >28 mL/min    Comment: (NOTE) Calculated using the CKD-EPI Creatinine Equation (2021)    Anion gap 12 5 - 15    Comment: Performed at [facility], [address] [place], [place] [address]  Amylase     Status: None   Collection Time: 09/18/23 10:29 AM  Result Value Ref Range   Amylase 79 28 - 100 U/L    Comment: Performed at [facility], [address], [place], [place] [address]  Lipase, blood     Status: None   Collection Time: 09/18/23 10:29 AM  Result  Value Ref Range   Lipase 28 11 - 51 U/L    Comment: Performed at [facility], [address] [place], [place] [address]  Urinalysis, Routine w reflex microscopic -Urine, Clean Catch     Status: Abnormal   Collection Time: 09/18/23 11:13 AM  Result Value Ref  Range   Color, Urine AMBER (A) YELLOW    Comment: BIOCHEMICALS MAY BE AFFECTED BY COLOR   APPearance CLOUDY (A) CLEAR   Specific Gravity, Urine 1.021 1.005 - 1.030   pH 6.0 5.0 - 8.0   Glucose, UA NEGATIVE NEGATIVE mg/dL   Hgb urine dipstick NEGATIVE NEGATIVE   Bilirubin Urine NEGATIVE NEGATIVE   Ketones, ur 5 (A) NEGATIVE mg/dL   Protein, ur 30 (A) NEGATIVE mg/dL   Nitrite NEGATIVE NEGATIVE   Leukocytes,Ua NEGATIVE NEGATIVE   RBC / HPF 0-5 0 - 5 RBC/hpf   WBC, UA 0-5 0 - 5 WBC/hpf   Bacteria, UA RARE (A) NONE SEEN   Squamous Epithelial / HPF 0-5 0 - 5 /HPF   Mucus PRESENT     Comment: Performed at [facility], [address] [place], [place] [address]  Rapid urine drug screen (hospital performed)     Status: Abnormal   Collection Time: 09/18/23 11:13 AM  Result Value Ref Range   Opiates NONE DETECTED NONE DETECTED   Cocaine NONE DETECTED NONE DETECTED   Benzodiazepines NONE DETECTED NONE DETECTED   Amphetamines NONE DETECTED NONE DETECTED   Tetrahydrocannabinol POSITIVE (A) NONE DETECTED   Barbiturates NONE DETECTED NONE DETECTED    Comment: (NOTE) DRUG SCREEN FOR MEDICAL PURPOSES ONLY.  IF CONFIRMATION IS NEEDED FOR ANY PURPOSE, NOTIFY LAB WITHIN 5 DAYS.  LOWEST DETECTABLE LIMITS FOR URINE DRUG SCREEN Drug Class                     Cutoff (ng/mL) Amphetamine and metabolites    1000 Barbiturate and metabolites    200 Benzodiazepine                 200 Opiates and metabolites        300 Cocaine and metabolites        300 THC                            50 Performed at [facility], [address] [place], [place] [address]      Review of Systems  Respiratory:  Negative for chest tightness and shortness of breath.   Gastrointestinal:  Positive for nausea and vomiting.  Genitourinary:  Negative for vaginal bleeding.  Neurological:  Negative for dizziness.   Physical Exam   Blood pressure 114/69, pulse 81, temperature 98 F (36.7 C), temperature  source Oral, resp. rate 16, SpO2 99%.  Physical Exam Constitutional:      General: She is not in acute distress.    Appearance: Normal appearance. She is not ill-appearing, toxic-appearing or diaphoretic.  Cardiovascular:     Rate and Rhythm: Normal rate.  Pulmonary:     Effort: Pulmonary effort is normal.  Abdominal:     Palpations: Abdomen is soft.  Genitourinary:    Comments: Dilation: 1.5 Effacement (%): Thick Cervical Position: Posterior Station: -3 Exam by:: Almond Jaffe, NP  Skin:    General: Skin is warm.  Neurological:     Mental Status: She is alert and oriented to person, place, and time.  Psychiatric:  Behavior: Behavior normal.    Fetal Tracing  Baseline: 125 bpm Variability: Moderate  Accelerations: 15x15 Decelerations: None Toco: UI MAU Course  Procedures  MDM  EKG wit normal sinus tachycardia.  LR bolus x 2 Zofran and Pecid given IV Scope patch placed CBC, CMP, Amylase, lipase without acute findings. Tylenol given 1 gram PO  No further vomiting in MAU following treatment Abdominal pain improved  Tachycardia resolved with IV Fluids (81) No other symptoms suggestive of PE other than tachycardia, no SOB or chest pain. Would consider CTA if patient returns with symptoms.   Assessment and Plan   A:  1. Nausea and vomiting during pregnancy   2. [redacted] weeks gestation of pregnancy   3. Sinus tachycardia      P:  Dc home Return to MAU if symptoms worsen Rx: Scope patch Ok to continue Zofran 8 mg Q8 hours Increase oral fluid intake with electrolytes.   Almond Jaffe I, NP 09/18/2023 8:04 PM

## 2023-09-18 NOTE — MAU Note (Signed)
.  Sarah Rollins is a 23 y.o. at [redacted]w[redacted]d here in MAU reporting:  ctx q 3-10 min since yesterday evening. Denies LOF or vag bleeding.  Pt also reports random episodes of tachycardia.  Pt HR was 155 in triage but went down to 88 after a few minutes.     Onset of complaint: 04/13 Pain score: 7 Vitals:   09/18/23 0943  Pulse: (!) 120  Resp: 17  Temp: 97.9 F (36.6 C)  SpO2: 100%     FHT: 130 Lab orders placed from triage:

## 2023-09-19 LAB — CULTURE, OB URINE: Culture: NO GROWTH

## 2023-09-21 ENCOUNTER — Telehealth: Admitting: Obstetrics and Gynecology

## 2023-09-21 ENCOUNTER — Encounter: Payer: Self-pay | Admitting: Obstetrics and Gynecology

## 2023-09-21 VITALS — BP 114/76 | HR 86 | Wt 159.0 lb

## 2023-09-21 DIAGNOSIS — F32A Depression, unspecified: Secondary | ICD-10-CM | POA: Diagnosis not present

## 2023-09-21 DIAGNOSIS — Z8759 Personal history of other complications of pregnancy, childbirth and the puerperium: Secondary | ICD-10-CM

## 2023-09-21 DIAGNOSIS — Z3480 Encounter for supervision of other normal pregnancy, unspecified trimester: Secondary | ICD-10-CM

## 2023-09-21 DIAGNOSIS — Z3483 Encounter for supervision of other normal pregnancy, third trimester: Secondary | ICD-10-CM | POA: Diagnosis not present

## 2023-09-21 DIAGNOSIS — Z3A34 34 weeks gestation of pregnancy: Secondary | ICD-10-CM

## 2023-09-21 DIAGNOSIS — F419 Anxiety disorder, unspecified: Secondary | ICD-10-CM

## 2023-09-21 NOTE — Progress Notes (Signed)
  I connected with [facility]: MyChart video and verified that I am speaking with the correct person using two identifiers.  Patient is located at home and provider is located at [facility].     I discussed the limitations, risks, security and privacy concerns of performing an evaluation and management service by MyChart video and the availability of in person appointments. I also discussed with the patient that there may be a patient responsible charge related to this service. By engaging in this virtual visit, you consent to the provision of healthcare.  Additionally, you authorize for your insurance to be billed for the services provided during this visit.  The patient expressed understanding and agreed to proceed.          PRENATAL VISIT NOTE  Subjective:  Sarah Rollins is a 23 y.o. G2P1001 at [redacted]w[redacted]d being seen today for ongoing prenatal care.  She is currently monitored for the following issues for this low-risk pregnancy and has Sinus tachycardia; ADHD; Marijuana use; Abnormal Pap smear of cervix; History of gestational hypertension; History of postpartum depression; Anxiety and depression; Encounter for supervision of normal pregnancy, antepartum; Chlamydia infection affecting pregnancy; and Dehydration during pregnancy on their problem list.  Patient reports  hospital x 2, ctx, dehydration. Still having intermittent ctx .  Contractions: Irritability.  .  Movement: Present. Denies leaking of fluid.   The following portions of the patient's history were reviewed and updated as appropriate: allergies, current medications, past family history, past medical history, past social history, past surgical history and problem list.   Objective:   Vitals:   09/21/23 1415  BP: 114/76  Pulse: 86  Weight: 159 lb (72.1 kg)    Fetal Status:     Movement: Present     General:  Alert, oriented and cooperative. Patient is in no acute distress.  Skin: Skin is warm and dry. No rash noted.    Cardiovascular: Normal heart rate noted  Respiratory: Normal respiratory effort, no problems with respiration noted  Abdomen: Soft, gravid, appropriate for gestational age.  Pain/Pressure: Absent     Pelvic: Cervical exam deferred        Extremities: Normal range of motion.  Edema: Trace  Mental Status: Normal mood and affect. Normal behavior. Normal judgment and thought content.   Assessment and Plan:  Pregnancy: G2P1001 at [redacted]w[redacted]d 1. Encounter for supervision of other normal pregnancy in third trimester (Primary) Feeling regular fetal movement Doing well with zofrna and reglan  Would like to do POPs for bc pp  2. Anxiety and depression Doing well on zoloft  3. History of gestational hypertension Normotensive, continue ASA  4. [redacted] weeks gestation of pregnancy Anticipatory guidance regarding upcoming appts Limit strenuous activity, strict precautions when to return to MAU  Preterm labor symptoms and general obstetric precautions including but not limited to vaginal bleeding, contractions, leaking of fluid and fetal movement were reviewed in detail with the patient. Please refer to After Visit Summary for other counseling recommendations.   Return in about 2 weeks (around 10/05/2023) for OB VISIT (MD or APP).    Susi Eric, FNP

## 2023-09-26 ENCOUNTER — Encounter (HOSPITAL_COMMUNITY): Payer: Self-pay | Admitting: Obstetrics and Gynecology

## 2023-09-26 ENCOUNTER — Other Ambulatory Visit: Payer: Self-pay

## 2023-09-26 ENCOUNTER — Inpatient Hospital Stay (HOSPITAL_COMMUNITY)
Admission: AD | Admit: 2023-09-26 | Discharge: 2023-09-27 | DRG: 831 | Disposition: A | Discharge status: Home / Self Care | Payer: Self-pay | Attending: Obstetrics and Gynecology | Admitting: Obstetrics and Gynecology

## 2023-09-26 DIAGNOSIS — Z881 Allergy status to other antibiotic agents status: Secondary | ICD-10-CM

## 2023-09-26 DIAGNOSIS — Z8249 Family history of ischemic heart disease and other diseases of the circulatory system: Secondary | ICD-10-CM

## 2023-09-26 DIAGNOSIS — F32A Depression, unspecified: Secondary | ICD-10-CM | POA: Diagnosis present

## 2023-09-26 DIAGNOSIS — O99283 Endocrine, nutritional and metabolic diseases complicating pregnancy, third trimester: Secondary | ICD-10-CM | POA: Diagnosis present

## 2023-09-26 DIAGNOSIS — F12288 Cannabis dependence with other cannabis-induced disorder: Secondary | ICD-10-CM | POA: Diagnosis present

## 2023-09-26 DIAGNOSIS — O99323 Drug use complicating pregnancy, third trimester: Secondary | ICD-10-CM | POA: Diagnosis present

## 2023-09-26 DIAGNOSIS — E860 Dehydration: Secondary | ICD-10-CM | POA: Diagnosis present

## 2023-09-26 DIAGNOSIS — Z7982 Long term (current) use of aspirin: Secondary | ICD-10-CM

## 2023-09-26 DIAGNOSIS — F1220 Cannabis dependence, uncomplicated: Secondary | ICD-10-CM | POA: Diagnosis present

## 2023-09-26 DIAGNOSIS — Z604 Social exclusion and rejection: Secondary | ICD-10-CM | POA: Diagnosis present

## 2023-09-26 DIAGNOSIS — Z79899 Other long term (current) drug therapy: Secondary | ICD-10-CM | POA: Diagnosis not present

## 2023-09-26 DIAGNOSIS — F1290 Cannabis use, unspecified, uncomplicated: Secondary | ICD-10-CM | POA: Diagnosis present

## 2023-09-26 DIAGNOSIS — Z3A35 35 weeks gestation of pregnancy: Secondary | ICD-10-CM | POA: Diagnosis not present

## 2023-09-26 DIAGNOSIS — K226 Gastro-esophageal laceration-hemorrhage syndrome: Secondary | ICD-10-CM | POA: Diagnosis present

## 2023-09-26 DIAGNOSIS — F419 Anxiety disorder, unspecified: Secondary | ICD-10-CM | POA: Diagnosis present

## 2023-09-26 DIAGNOSIS — Z818 Family history of other mental and behavioral disorders: Secondary | ICD-10-CM

## 2023-09-26 DIAGNOSIS — Z800 Family history of malignant neoplasm of digestive organs: Secondary | ICD-10-CM | POA: Diagnosis not present

## 2023-09-26 DIAGNOSIS — Z803 Family history of malignant neoplasm of breast: Secondary | ICD-10-CM | POA: Diagnosis not present

## 2023-09-26 DIAGNOSIS — Z3490 Encounter for supervision of normal pregnancy, unspecified, unspecified trimester: Secondary | ICD-10-CM

## 2023-09-26 DIAGNOSIS — O99343 Other mental disorders complicating pregnancy, third trimester: Secondary | ICD-10-CM | POA: Diagnosis present

## 2023-09-26 LAB — CBC WITH DIFFERENTIAL/PLATELET
Abs Immature Granulocytes: 0.19 10*3/uL — ABNORMAL HIGH (ref 0.00–0.07)
Basophils Absolute: 0.1 10*3/uL (ref 0.0–0.1)
Basophils Relative: 0 %
Eosinophils Absolute: 0 10*3/uL (ref 0.0–0.5)
Eosinophils Relative: 0 %
HCT: 35.3 % — ABNORMAL LOW (ref 36.0–46.0)
Hemoglobin: 11.9 g/dL — ABNORMAL LOW (ref 12.0–15.0)
Immature Granulocytes: 2 %
Lymphocytes Relative: 20 %
Lymphs Abs: 2.5 10*3/uL (ref 0.7–4.0)
MCH: 32.2 pg (ref 26.0–34.0)
MCHC: 33.7 g/dL (ref 30.0–36.0)
MCV: 95.7 fL (ref 80.0–100.0)
Monocytes Absolute: 0.7 10*3/uL (ref 0.1–1.0)
Monocytes Relative: 5 %
Neutro Abs: 9.3 10*3/uL — ABNORMAL HIGH (ref 1.7–7.7)
Neutrophils Relative %: 73 %
Platelets: 262 10*3/uL (ref 150–400)
RBC: 3.69 MIL/uL — ABNORMAL LOW (ref 3.87–5.11)
RDW: 12.2 % (ref 11.5–15.5)
WBC: 12.7 10*3/uL — ABNORMAL HIGH (ref 4.0–10.5)
nRBC: 0 % (ref 0.0–0.2)

## 2023-09-26 LAB — WET PREP, GENITAL
Clue Cells Wet Prep HPF POC: NONE SEEN
Sperm: NONE SEEN
Trich, Wet Prep: NONE SEEN
WBC, Wet Prep HPF POC: >=10 — AB (ref <10)
Yeast Wet Prep HPF POC: NONE SEEN

## 2023-09-26 LAB — TSH: TSH: 2.75 u[IU]/mL (ref 0.350–4.500)

## 2023-09-26 LAB — COMPREHENSIVE METABOLIC PANEL WITH GFR
ALT: 14 U/L (ref 0–44)
AST: 24 U/L (ref 15–41)
Albumin: 3.1 g/dL — ABNORMAL LOW (ref 3.5–5.0)
Alkaline Phosphatase: 93 U/L (ref 38–126)
Anion gap: 12 (ref 5–15)
BUN: 7 mg/dL (ref 6–20)
CO2: 22 mmol/L (ref 22–32)
Calcium: 9.2 mg/dL (ref 8.9–10.3)
Chloride: 99 mmol/L (ref 98–111)
Creatinine, Ser: 0.59 mg/dL (ref 0.44–1.00)
GFR, Estimated: >60 mL/min (ref >60)
Glucose, Bld: 121 mg/dL — ABNORMAL HIGH (ref 70–99)
Potassium: 3.6 mmol/L (ref 3.5–5.1)
Sodium: 133 mmol/L — ABNORMAL LOW (ref 135–145)
Total Bilirubin: 0.7 mg/dL (ref 0.0–1.2)
Total Protein: 7 g/dL (ref 6.5–8.1)

## 2023-09-26 LAB — HEPATITIS C ANTIBODY: HCV Ab: NEGATIVE

## 2023-09-26 LAB — URINALYSIS, ROUTINE W REFLEX MICROSCOPIC
Bilirubin Urine: NEGATIVE
Glucose, UA: NEGATIVE mg/dL
Hgb urine dipstick: NEGATIVE
Ketones, ur: 5 mg/dL — AB
Nitrite: NEGATIVE
Protein, ur: 30 mg/dL — AB
Specific Gravity, Urine: 1.029 (ref 1.005–1.030)
pH: 5 (ref 5.0–8.0)

## 2023-09-26 LAB — POCT FERN TEST: POCT Fern Test: NEGATIVE

## 2023-09-26 LAB — T4, FREE: Free T4: 0.66 ng/dL (ref 0.61–1.12)

## 2023-09-26 LAB — MAGNESIUM: Magnesium: 1.4 mg/dL — ABNORMAL LOW (ref 1.7–2.4)

## 2023-09-26 MED ORDER — MAGNESIUM SULFATE 2 GM/50ML IV SOLN
2.0000 g | Freq: Once | INTRAVENOUS | Status: AC
  Administered 2023-09-26: 2 g via INTRAVENOUS
  Filled 2023-09-26: qty 50

## 2023-09-26 MED ORDER — OXYCODONE-ACETAMINOPHEN 5-325 MG PO TABS: 1.0000 | ORAL_TABLET | ORAL | Status: DC | PRN

## 2023-09-26 MED ORDER — LACTATED RINGERS IV SOLN: 500.0000 mL | INTRAVENOUS | Status: DC | PRN

## 2023-09-26 MED ORDER — PENICILLIN G POT IN DEXTROSE 60000 UNIT/ML IV SOLN
3.0000 10*6.[IU] | INTRAVENOUS | Status: DC
  Administered 2023-09-27 (×4): 3 10*6.[IU] via INTRAVENOUS
  Filled 2023-09-26 (×4): qty 50

## 2023-09-26 MED ORDER — LACTATED RINGERS IV SOLN: 500.0000 mL | Freq: Once | INTRAVENOUS | Status: DC

## 2023-09-26 MED ORDER — OXYTOCIN-SODIUM CHLORIDE 30-0.9 UT/500ML-% IV SOLN: 2.5000 [IU]/h | INTRAVENOUS | Status: DC

## 2023-09-26 MED ORDER — PHENYLEPHRINE 80 MCG/ML (10ML) SYRINGE FOR IV PUSH (FOR BLOOD PRESSURE SUPPORT): 80.0000 ug | PREFILLED_SYRINGE | INTRAVENOUS | Status: DC | PRN

## 2023-09-26 MED ORDER — DIPHENHYDRAMINE HCL 50 MG/ML IJ SOLN: 12.5000 mg | INTRAMUSCULAR | Status: DC | PRN

## 2023-09-26 MED ORDER — FENTANYL-BUPIVACAINE-NACL 0.5-0.125-0.9 MG/250ML-% EP SOLN
12.0000 mL/h | EPIDURAL | Status: DC | PRN
  Filled 2023-09-26: qty 250

## 2023-09-26 MED ORDER — OXYTOCIN BOLUS FROM INFUSION: 333.0000 mL | Freq: Once | INTRAVENOUS | Status: DC

## 2023-09-26 MED ORDER — POTASSIUM CHLORIDE CRYS ER 20 MEQ PO TBCR
40.0000 meq | EXTENDED_RELEASE_TABLET | Freq: Once | ORAL | Status: AC
  Administered 2023-09-26: 40 meq via ORAL
  Filled 2023-09-26: qty 2

## 2023-09-26 MED ORDER — FENTANYL CITRATE (PF) 100 MCG/2ML IJ SOLN
100.0000 ug | INTRAMUSCULAR | Status: DC | PRN
  Administered 2023-09-27: 100 ug via INTRAVENOUS
  Filled 2023-09-26 (×2): qty 2

## 2023-09-26 MED ORDER — ACETAMINOPHEN 325 MG PO TABS: 650.0000 mg | ORAL_TABLET | ORAL | Status: DC | PRN

## 2023-09-26 MED ORDER — SODIUM CHLORIDE 0.9 % IV SOLN
8.0000 mg | Freq: Once | INTRAVENOUS | Status: AC
  Administered 2023-09-26: 8 mg via INTRAVENOUS
  Filled 2023-09-26: qty 4

## 2023-09-26 MED ORDER — NIFEDIPINE 10 MG PO CAPS
10.0000 mg | ORAL_CAPSULE | ORAL | Status: AC | PRN
  Administered 2023-09-26 (×3): 10 mg via ORAL
  Filled 2023-09-26 (×3): qty 1

## 2023-09-26 MED ORDER — EPHEDRINE 5 MG/ML INJ: 10.0000 mg | INTRAVENOUS | Status: DC | PRN

## 2023-09-26 MED ORDER — OXYCODONE-ACETAMINOPHEN 5-325 MG PO TABS: 2.0000 | ORAL_TABLET | ORAL | Status: DC | PRN

## 2023-09-26 MED ORDER — SOD CITRATE-CITRIC ACID 500-334 MG/5ML PO SOLN: 30.0000 mL | ORAL | Status: DC | PRN

## 2023-09-26 MED ORDER — LOPERAMIDE HCL 2 MG PO CAPS
4.0000 mg | ORAL_CAPSULE | Freq: Once | ORAL | Status: AC
  Administered 2023-09-26: 4 mg via ORAL
  Filled 2023-09-26: qty 2

## 2023-09-26 MED ORDER — LACTATED RINGERS IV BOLUS
1000.0000 mL | Freq: Once | INTRAVENOUS | Status: AC
  Administered 2023-09-26: 1000 mL via INTRAVENOUS

## 2023-09-26 MED ORDER — LIDOCAINE HCL (PF) 1 % IJ SOLN: 30.0000 mL | INTRAMUSCULAR | Status: DC | PRN

## 2023-09-26 MED ORDER — ONDANSETRON HCL 4 MG/2ML IJ SOLN
4.0000 mg | Freq: Four times a day (QID) | INTRAMUSCULAR | Status: DC | PRN
  Administered 2023-09-27 (×2): 4 mg via INTRAVENOUS
  Filled 2023-09-26 (×2): qty 2

## 2023-09-26 MED ORDER — LACTATED RINGERS IV SOLN: INTRAVENOUS | Status: DC

## 2023-09-26 MED ORDER — FENTANYL CITRATE (PF) 100 MCG/2ML IJ SOLN
50.0000 ug | INTRAMUSCULAR | Status: DC | PRN
  Administered 2023-09-26 – 2023-09-27 (×9): 100 ug via INTRAVENOUS
  Filled 2023-09-26 (×8): qty 2

## 2023-09-26 MED ORDER — ACETAMINOPHEN-CAFFEINE 500-65 MG PO TABS
2.0000 | ORAL_TABLET | Freq: Once | ORAL | Status: AC
  Administered 2023-09-26: 2 via ORAL
  Filled 2023-09-26: qty 2

## 2023-09-26 MED ORDER — SODIUM CHLORIDE 0.9 % IV SOLN
5.0000 10*6.[IU] | Freq: Once | INTRAVENOUS | Status: AC
  Administered 2023-09-26: 5 10*6.[IU] via INTRAVENOUS
  Filled 2023-09-26: qty 5

## 2023-09-26 NOTE — MAU Provider Note (Signed)
 Dehydrated    S Sarah Rollins is a 23 y.o. G35P1001 pregnant female at [redacted]w[redacted]d who presents to MAU today with complaint of dehydration. Pt states took BP at home at 0830 and it was 89/65, HR was 148.  She has had 3 incidents of emesis in past 24hrs.  Also states one loose stool.  HA also endorsed. She states also having irregular contractions that have not become more regular q2-23mins since gush of fluids.  She states at her last emesis episode she felt fluid come out and unsure if water broke.  Denies VB.  Endorses +FM.    Receives care at [facility]. Prenatal records reviewed.  Pertinent items noted in HPI and remainder of comprehensive ROS otherwise negative.   O BP 117/72   Pulse 86   Temp 97.7 F (36.5 C) (Oral)   Resp 20   Ht 5' (1.524 m)   Wt 74.8 kg   SpO2 100%   BMI 32.22 kg/m  Physical Exam Vitals and nursing note reviewed. Exam conducted with a chaperone present.  Constitutional:      General: She is not in acute distress.    Appearance: Normal appearance. She is normal weight. She is not ill-appearing.  HENT:     Head: Normocephalic and atraumatic.     Right Ear: External ear normal.     Left Ear: External ear normal.     Nose: Nose normal. No congestion.     Mouth/Throat:     Mouth: Mucous membranes are moist.     Pharynx: Oropharynx is clear.  Eyes:     Extraocular Movements: Extraocular movements intact.     Conjunctiva/sclera: Conjunctivae normal.  Cardiovascular:     Rate and Rhythm: Normal rate.  Pulmonary:     Effort: Pulmonary effort is normal. No respiratory distress.  Abdominal:     General: There is no distension.     Palpations: Abdomen is soft.     Tenderness: There is no abdominal tenderness.     Comments: gravid  Genitourinary:    General: Normal vulva.     Vagina: No vaginal discharge.     Comments: No pooling, CVE unchanged at 1.5/50/-3 Musculoskeletal:        General: No swelling. Normal range of motion.     Cervical back: Normal range of  motion.  Skin:    General: Skin is warm and dry.  Neurological:     Mental Status: She is alert and oriented to person, place, and time. Mental status is at baseline.     Motor: No weakness.     Gait: Gait normal.  Psychiatric:        Mood and Affect: Mood normal.        Behavior: Behavior normal.   NST: 135bpm, moderate variability, +accels, what appeared to be single decel shortly after getting on monitor however was not audible in room per nursing and pt report and followed curve of maternal strip and otherwise no decelerations, CTX q32mins   MDM: MAU Course:  No pooling, unchanged CVE.  Fern negative.  Wet prep negative.  GC collected.  UA noninfectious, Ketones 5, Protein 30.  However pt states with her palpitations she is also SOB during CTX despite SORA.  Pt states was on propranolol for anxiety and tachycardia previously but was taken off of it in early pregnancy.  EKG sinus tachycardia with ventricular rate of 137bpm, unchanged from prior  CBC hgb 11.9, WBC 12.7 given pregnancy normal   Patient actively throwing  up.  Gave 8mg  of IV Zofran .  Rechecked patient and CVE changed to 3cm.  Will redose fluids and try procardia  per Dr. Racheal Buddle.   Magnesium  came back 1.4, will give 2g Bolus.  K 3.6, will give 40meq PO once.  Aiming for Mg >2 and K >4 given palpitations. TSH elevated at 2.75 so ordered T4.   Procardia  series not effective nor another liter of LR.  Plan to admit for expectant management. Pt agreeable.   AP #[redacted] weeks gestation #Preterm labor - s/p 2 L LR and 3 Doses of Procardia  w/o success in spacing contractions - confirmed cephalic with BSUS - expectant management - admit to L&D  Ebony Goldstein, MD 09/26/2023 9:28 PM

## 2023-09-26 NOTE — MAU Note (Signed)
 Sarah Rollins is a 23 y.o. at [redacted]w[redacted]d here in MAU reporting: she took her BP today at home around 0830 this morning and reports it was 89/65 and her HR 148 She reports irregular ctx She's had 3 occurrences of emesis in the past 24 hours. She reports 1 occurrence of loose stool today. She reports at around 1530 today she had an episode of emesis and felt fluid come out and is unsure if it was her water. Denies VB.   Onset of complaint: 0730 Pain score:  ctx 7/10 HA 6/10 Vitals:   09/26/23 1901  BP: 120/73  Pulse: (!) 136  Resp: 20  Temp: 97.7 F (36.5 C)  SpO2: 100%     FHT: 147  Lab orders placed from triage: none

## 2023-09-27 DIAGNOSIS — F12288 Cannabis dependence with other cannabis-induced disorder: Secondary | ICD-10-CM | POA: Diagnosis present

## 2023-09-27 DIAGNOSIS — K226 Gastro-esophageal laceration-hemorrhage syndrome: Secondary | ICD-10-CM | POA: Diagnosis not present

## 2023-09-27 LAB — GC/CHLAMYDIA PROBE AMP (~~LOC~~) NOT AT ARMC
Chlamydia: NEGATIVE
Comment: NEGATIVE
Comment: NORMAL
Neisseria Gonorrhea: NEGATIVE

## 2023-09-27 LAB — COMPREHENSIVE METABOLIC PANEL WITH GFR
ALT: 15 U/L (ref 0–44)
AST: 22 U/L (ref 15–41)
Albumin: 2.8 g/dL — ABNORMAL LOW (ref 3.5–5.0)
Alkaline Phosphatase: 86 U/L (ref 38–126)
Anion gap: 12 (ref 5–15)
BUN: <5 mg/dL — ABNORMAL LOW (ref 6–20)
CO2: 22 mmol/L (ref 22–32)
Calcium: 8.9 mg/dL (ref 8.9–10.3)
Chloride: 101 mmol/L (ref 98–111)
Creatinine, Ser: 0.58 mg/dL (ref 0.44–1.00)
GFR, Estimated: >60 mL/min (ref >60)
Glucose, Bld: 82 mg/dL (ref 70–99)
Potassium: 3.9 mmol/L (ref 3.5–5.1)
Sodium: 135 mmol/L (ref 135–145)
Total Bilirubin: 1.7 mg/dL — ABNORMAL HIGH (ref 0.0–1.2)
Total Protein: 6.4 g/dL — ABNORMAL LOW (ref 6.5–8.1)

## 2023-09-27 LAB — TYPE AND SCREEN
ABO/RH(D): O POS
Antibody Screen: NEGATIVE

## 2023-09-27 LAB — RPR: RPR Ser Ql: NONREACTIVE

## 2023-09-27 LAB — CBC
HCT: 32.9 % — ABNORMAL LOW (ref 36.0–46.0)
Hemoglobin: 10.9 g/dL — ABNORMAL LOW (ref 12.0–15.0)
MCH: 31.5 pg (ref 26.0–34.0)
MCHC: 33.1 g/dL (ref 30.0–36.0)
MCV: 95.1 fL (ref 80.0–100.0)
Platelets: 261 10*3/uL (ref 150–400)
RBC: 3.46 MIL/uL — ABNORMAL LOW (ref 3.87–5.11)
RDW: 12.2 % (ref 11.5–15.5)
WBC: 10.9 10*3/uL — ABNORMAL HIGH (ref 4.0–10.5)
nRBC: 0 % (ref 0.0–0.2)

## 2023-09-27 LAB — LIPASE, BLOOD: Lipase: 27 U/L (ref 11–51)

## 2023-09-27 MED ORDER — HALOPERIDOL 0.5 MG PO TABS: 0.5000 mg | ORAL_TABLET | Freq: Four times a day (QID) | ORAL | 0 refills | Status: DC | PRN

## 2023-09-27 MED ORDER — NIFEDIPINE 10 MG PO CAPS
10.0000 mg | ORAL_CAPSULE | ORAL | Status: DC | PRN
  Administered 2023-09-27 (×2): 10 mg via ORAL
  Filled 2023-09-27 (×2): qty 1

## 2023-09-27 MED ORDER — BUSPIRONE HCL 10 MG PO TABS
10.0000 mg | ORAL_TABLET | Freq: Two times a day (BID) | ORAL | Status: DC
  Administered 2023-09-27: 10 mg via ORAL
  Filled 2023-09-27 (×2): qty 1

## 2023-09-27 MED ORDER — HALOPERIDOL LACTATE 5 MG/ML IJ SOLN
1.0000 mg | Freq: Once | INTRAMUSCULAR | Status: AC
  Administered 2023-09-27: 1 mg via INTRAVENOUS
  Filled 2023-09-27: qty 0.2

## 2023-09-27 MED ORDER — FAMOTIDINE 20 MG PO TABS: 20.0000 mg | ORAL_TABLET | Freq: Two times a day (BID) | ORAL | 0 refills | Status: DC

## 2023-09-27 MED ORDER — FAMOTIDINE IN NACL 20-0.9 MG/50ML-% IV SOLN
20.0000 mg | Freq: Once | INTRAVENOUS | Status: AC
  Administered 2023-09-27: 20 mg via INTRAVENOUS
  Filled 2023-09-27: qty 50

## 2023-09-27 MED ORDER — METOCLOPRAMIDE HCL 10 MG PO TABS: 10.0000 mg | ORAL_TABLET | Freq: Four times a day (QID) | ORAL | Status: DC | PRN

## 2023-09-27 MED ORDER — SERTRALINE HCL 100 MG PO TABS
100.0000 mg | ORAL_TABLET | Freq: Every day | ORAL | Status: DC
Start: 2023-09-27 — End: 2023-09-28
  Administered 2023-09-27: 100 mg via ORAL
  Filled 2023-09-27: qty 1

## 2023-09-27 MED ORDER — NIFEDIPINE 10 MG PO CAPS: 10.0000 mg | ORAL_CAPSULE | ORAL | 0 refills | Status: DC | PRN

## 2023-09-27 MED ORDER — SODIUM CHLORIDE 0.9 % IV SOLN
12.5000 mg | Freq: Once | INTRAVENOUS | Status: AC
  Administered 2023-09-27: 12.5 mg via INTRAVENOUS
  Filled 2023-09-27: qty 0.5

## 2023-09-27 MED ORDER — METOCLOPRAMIDE HCL 5 MG/ML IJ SOLN
10.0000 mg | Freq: Once | INTRAMUSCULAR | Status: AC
  Administered 2023-09-27: 10 mg via INTRAVENOUS
  Filled 2023-09-27: qty 2

## 2023-09-27 NOTE — Progress Notes (Signed)
 LABOR PROGRESS NOTE  Patient Name: Sarah Rollins, female   DOB: 2000-12-24, 23 y.o.  MRN: [id number]  Patient feeling painful contractions. Fentanyl  still helpful and allowing sleep. Cervix still the same as morning check. Discussed with patient that we will not augment her labor given preterm status. If she continues to progress and contractions become more painful, epidural is an option. Discussed that an epidural does not guarantee a delivery, and there is an option to remove it if labor fizzles out. She would like to continue with fentanyl  for pain management at this time. Cat I. Will continue to monitor on L&D.  Maud Sorenson, MD

## 2023-09-27 NOTE — H&P (Cosign Needed Addendum)
 OBSTETRIC ADMISSION HISTORY AND PHYSICAL  Sarah Rollins is a 23 y.o. female G2P1001 with IUP at [redacted]w[redacted]d by US  presenting for preterm labor, being admitted for expectant management. She reports +FMs, No LOF, no VB, no blurry vision, headaches or peripheral edema, and RUQ pain.  She plans on breast feeding. She request POPs for birth control. She received her prenatal care at [facility]   Dating: By 7 wk US  --->  Estimated Date of Delivery: 10/29/23  Sono:    @[redacted]w[redacted]d , CWD, normal anatomy, breech presentation, posterior lie, 548g, 21.5% EFW  Confirmed vertex positioning in MAU today  Prenatal History/Complications:  -Sinus Tachycardia  -ADHD -Hx of GHTN -Hx of PPD -Chlamydia infection during pregnancy  Past Medical History: Past Medical History:  Diagnosis Date   ADHD    Anxiety    Depression    Pregnancy induced hypertension    Vaginal Pap smear, abnormal     Past Surgical History: Past Surgical History:  Procedure Laterality Date   WISDOM TOOTH EXTRACTION      Obstetrical History: OB History     Gravida  2   Para  1   Term  1   Preterm  0   AB  0   Living  1      SAB  0   IAB  0   Ectopic  0   Multiple  0   Live Births  1           Social History Social History   Socioeconomic History   Marital status: Significant Other    Spouse name: Not on file   Number of children: Not on file   Years of education: Not on file   Highest education level: Not on file  Occupational History   Not on file  Tobacco Use   Smoking status: Never   Smokeless tobacco: Never  Vaping Use   Vaping status: Never Used  Substance and Sexual Activity   Alcohol use: Not Currently   Drug use: Not Currently    Types: Marijuana    Comment: daily   Sexual activity: Yes    Birth control/protection: None  Other Topics Concern   Not on file  Social History Narrative   Not on file   Social Drivers of Health   Financial Resource Strain: Low Risk  (05/16/2023)    Overall Financial Resource Strain (CARDIA)    Difficulty of Paying Living Expenses: Not hard at all  Food Insecurity: No Food Insecurity (09/26/2023)   Hunger Vital Sign    Worried About Running Out of Food in the Last Year: Never true    Ran Out of Food in the Last Year: Never true  Transportation Needs: No Transportation Needs (09/26/2023)   PRAPARE - Administrator, Civil Service (Medical): No    Lack of Transportation (Non-Medical): No  Physical Activity: Insufficiently Active (05/16/2023)   Exercise Vital Sign    Days of Exercise per Week: 4 days    Minutes of Exercise per Session: 20 min  Stress: Stress Concern Present (05/16/2023)   [employer] of Occupational Health - Occupational Stress Questionnaire    Feeling of Stress : Very much  Social Connections: Socially Isolated (05/16/2023)   Social Connection and Isolation Panel [NHANES]    Frequency of Communication with Friends and Family: Once a week    Frequency of Social Gatherings with Friends and Family: Once a week    Attends Religious Services: 1 to 4 times per year  Active Member of Clubs or Organizations: No    Attends Banker Meetings: Never    Marital Status: Never married    Family History: Family History  Problem Relation Age of Onset   Depression Sister    Anxiety disorder Sister    Hypertension Mother    Heart Problems Mother    Colon cancer Maternal Grandfather    Breast cancer Paternal Grandmother     Allergies: Allergies  Allergen Reactions   Ciprofloxacin Rash    Medications Prior to Admission  Medication Sig Dispense Refill Last Dose/Taking   metoCLOPramide  (REGLAN ) 10 MG tablet Take 1 tablet (10 mg total) by mouth every 8 (eight) hours as needed. 30 tablet 1 09/25/2023   ondansetron  (ZOFRAN -ODT) 4 MG disintegrating tablet Take 1 tablet (4 mg total) by mouth every 8 (eight) hours as needed for nausea or vomiting. 30 tablet 1 09/25/2023   Prenatal Vit-Fe Fumarate-FA  (PRENATAL VITAMIN PO) Take by mouth.   09/25/2023   aspirin  EC 81 MG tablet Take 2 tablets (162 mg total) by mouth daily. 60 tablet 6    Blood Pressure Monitor MISC For regular home bp monitoring during pregnancy 1 each 0    busPIRone  (BUSPAR ) 10 MG tablet TAKE 1 TABLET BY MOUTH TWICE A DAY 180 tablet 2    dicyclomine  (BENTYL ) 20 MG tablet Take 1 tablet (20 mg total) by mouth 2 (two) times daily as needed for spasms. (Patient not taking: Reported on 09/21/2023) 20 tablet 0    doxylamine , Sleep, (UNISOM ) 25 MG tablet Take 1 tablet (25 mg total) by mouth 2 (two) times daily. (Patient not taking: Reported on 09/21/2023) 60 tablet 0    famotidine  (PEPCID ) 20 MG tablet Take 1 tablet (20 mg total) by mouth 2 (two) times daily. (Patient not taking: Reported on 09/21/2023) 60 tablet 0    pyridOXINE  (VITAMIN B6) 50 MG tablet Take 1 tablet (50 mg total) by mouth 2 (two) times daily. 60 tablet 0    sertraline  (ZOLOFT ) 100 MG tablet TAKE 1 EVERY DAY 180 tablet 3      Review of Systems   All systems reviewed and negative except as stated in HPI  Blood pressure 126/70, pulse (!) 106, temperature 97.8 F (36.6 C), temperature source Oral, resp. rate 18, height 5' (1.524 m), weight 74.8 kg, SpO2 97%. General appearance: alert, cooperative, and no distress Lungs: clear to auscultation bilaterally Heart: regular rate and rhythm Abdomen: soft, non-tender; bowel sounds normal Extremities: Homans sign is negative, no sign of DVT Presentation: cephalic Fetal monitoringBaseline: 150 bpm, Variability: Good {> 6 bpm), and Accelerations: Reactive Uterine activity regular contractions ever 5-7 minutes Dilation: 3 Effacement (%): 50 Station: -3 Exam by:: Elnora Hailey, RN   Prenatal labs: ABO, Rh: O/Positive/-- (12/09 1504) Antibody: Negative (02/25 0854) Rubella: 2.13 (12/09 1504) RPR: Non Reactive (02/25 0854)  HBsAg: Negative (12/09 1504)  HIV: Non Reactive (02/25 0854)  GBS:      Lab Results  Component  Value Date   GBS Negative 09/30/2020   GTT WNL Genetic screening  NIPS LR, AFP normal Anatomy US  normal  Immunization History  Administered Date(s) Administered   Influenza,inj,Quad PF,6+ Mos 08/22/2020   Tdap 08/22/2020, 09/07/2023    Prenatal Transfer Tool  Maternal Diabetes: No Genetic Screening: Normal Maternal Ultrasounds/Referrals: Normal Fetal Ultrasounds or other Referrals:  None Maternal Substance Abuse:  No Significant Maternal Medications:  Meds include: Zoloft  Significant Maternal Lab Results: Other: GBS collected, results pending Number of Prenatal Visits:greater than 3  verified prenatal visits Maternal Vaccinations:TDap Other Comments:  None   Results for orders placed or performed during the hospital encounter of 09/26/23 (from the past 24 hours)  Wet prep, genital   Collection Time: 09/26/23  7:24 PM  Result Value Ref Range   Yeast Wet Prep HPF POC NONE SEEN NONE SEEN   Trich, Wet Prep NONE SEEN NONE SEEN   Clue Cells Wet Prep HPF POC NONE SEEN NONE SEEN   WBC, Wet Prep HPF POC >=10 (A) <10   Sperm NONE SEEN   POCT fern test   Collection Time: 09/26/23  7:24 PM  Result Value Ref Range   POCT Fern Test Negative = intact amniotic membranes   T4, free   Collection Time: 09/26/23  7:44 PM  Result Value Ref Range   Free T4 0.66 0.61 - 1.12 ng/dL  CBC with Differential/Platelet   Collection Time: 09/26/23  7:46 PM  Result Value Ref Range   WBC 12.7 (H) 4.0 - 10.5 K/uL   RBC 3.69 (L) 3.87 - 5.11 MIL/uL   Hemoglobin 11.9 (L) 12.0 - 15.0 g/dL   HCT 09.8 (L) 11.9 - 14.7 %   MCV 95.7 80.0 - 100.0 fL   MCH 32.2 26.0 - 34.0 pg   MCHC 33.7 30.0 - 36.0 g/dL   RDW 82.9 56.2 - 13.0 %   Platelets 262 150 - 400 K/uL   nRBC 0.0 0.0 - 0.2 %   Neutrophils Relative % 73 %   Neutro Abs 9.3 (H) 1.7 - 7.7 K/uL   Lymphocytes Relative 20 %   Lymphs Abs 2.5 0.7 - 4.0 K/uL   Monocytes Relative 5 %   Monocytes Absolute 0.7 0.1 - 1.0 K/uL   Eosinophils Relative 0 %    Eosinophils Absolute 0.0 0.0 - 0.5 K/uL   Basophils Relative 0 %   Basophils Absolute 0.1 0.0 - 0.1 K/uL   Immature Granulocytes 2 %   Abs Immature Granulocytes 0.19 (H) 0.00 - 0.07 K/uL  Comprehensive metabolic panel   Collection Time: 09/26/23  7:46 PM  Result Value Ref Range   Sodium 133 (L) 135 - 145 mmol/L   Potassium 3.6 3.5 - 5.1 mmol/L   Chloride 99 98 - 111 mmol/L   CO2 22 22 - 32 mmol/L   Glucose, Bld 121 (H) 70 - 99 mg/dL   BUN 7 6 - 20 mg/dL   Creatinine, Ser 8.65 0.44 - 1.00 mg/dL   Calcium 9.2 8.9 - 78.4 mg/dL   Total Protein 7.0 6.5 - 8.1 g/dL   Albumin 3.1 (L) 3.5 - 5.0 g/dL   AST 24 15 - 41 U/L   ALT 14 0 - 44 U/L   Alkaline Phosphatase 93 38 - 126 U/L   Total Bilirubin 0.7 0.0 - 1.2 mg/dL   GFR, Estimated >69 >62 mL/min   Anion gap 12 5 - 15  Magnesium    Collection Time: 09/26/23  7:46 PM  Result Value Ref Range   Magnesium  1.4 (L) 1.7 - 2.4 mg/dL  TSH   Collection Time: 09/26/23  7:46 PM  Result Value Ref Range   TSH 2.750 0.350 - 4.500 uIU/mL  Urinalysis, Routine w reflex microscopic -Urine, Clean Catch   Collection Time: 09/26/23  8:18 PM  Result Value Ref Range   Color, Urine AMBER (A) YELLOW   APPearance HAZY (A) CLEAR   Specific Gravity, Urine 1.029 1.005 - 1.030   pH 5.0 5.0 - 8.0   Glucose, UA NEGATIVE NEGATIVE mg/dL  Hgb urine dipstick NEGATIVE NEGATIVE   Bilirubin Urine NEGATIVE NEGATIVE   Ketones, ur 5 (A) NEGATIVE mg/dL   Protein, ur 30 (A) NEGATIVE mg/dL   Nitrite NEGATIVE NEGATIVE   Leukocytes,Ua TRACE (A) NEGATIVE   RBC / HPF 0-5 0 - 5 RBC/hpf   WBC, UA 6-10 0 - 5 WBC/hpf   Bacteria, UA RARE (A) NONE SEEN   Squamous Epithelial / HPF 6-10 0 - 5 /HPF   Mucus PRESENT     Patient Active Problem List   Diagnosis Date Noted   Preterm labor 09/26/2023   Dehydration during pregnancy 08/24/2023   Chlamydia infection affecting pregnancy 05/19/2023   Encounter for supervision of normal pregnancy, antepartum 05/12/2023   Anxiety and  depression 04/23/2022   History of postpartum depression 11/13/2020   History of gestational hypertension 10/08/2020   Abnormal Pap smear of cervix 05/14/2020   Marijuana use 04/16/2020   Sinus tachycardia 05/29/2017   ADHD 06/12/2016    Assessment/Plan:  Jasper Ruminski is a 23 y.o. G2P1001 at [redacted]w[redacted]d here for preterm labor.  #Labor: expectant management, may discharge if progress halts #Pain: On demand #FWB: Cat I #GBS status:  Pending result #Feeding: Breastmilk  #Reproductive Life planning: Progesterone only pills #Circ:  not applicable  Rayma Calandra, DO  09/27/2023, 1:10 AM    Attestation:  I confirm that I have verified the information documented in the resident's note and that I have also personally reperformed the physical exam and all medical decision making activities.   The patient was seen and examined by me also Agree with note NST reactive and reassuring UCs as listed Cervical exams as listed in note Harlee Lichtenstein, CNM

## 2023-09-27 NOTE — Progress Notes (Signed)
 LABOR PROGRESS NOTE  Patient Name: Sarah Rollins, female   DOB: 01-23-2001, 23 y.o.  MRN: [id number]  Patient still feeling contractions painfully. However is able to sleep most of the day. New development of hematemesis. Patient notes she has had hyperemesis throughout pregnancy. Usually takes zofran , reglan  Q8 with good relief of symptoms. Has had both here and phenergan . Now with slight hematemesis. Patient did note that she has used marijuana during the pregnancy. Babe remains Cat I.  #PTL: Cervix remains unchanged. Have had mild improvement in contractions after Procardia . Discussed with patient that if cervix remains unchanged will be eligible for discharge. No apparent cause for preterm contractions at this time. UTI ruled-out. No symptoms of vaginitis. Has received copious IV fluids so do not suspect dehydration even with some vomiting at this time.  #Cannabis hyperemesis/suspect Mallory-Weiss tear: Will trial dose of haldol  as zofran , reglan , phenergan  haven't been sufficient. Additionally will give pepcid  for esophagus protection. Will additionally recheck CBC, CMP, and add lipase. No signs of instability at this time nor do I suspect a significant upper GI bleed.  Maud Sorenson, MD

## 2023-09-27 NOTE — Progress Notes (Signed)
 Patient ID: Sarah Rollins, female   DOB: [birth year], 23 y.o.   MRN: [id number] Earlier exams showed no change in cervix, but RN reports cervix has now changed.  Vitals:   09/26/23 2148 09/26/23 2208 09/26/23 2255 09/27/23 0402  BP: 105/68 116/69 126/70 104/64  Pulse: (!) 120 (!) 119 (!) 106 90  Resp:   18 17  Temp:   97.8 F (36.6 C) 97.9 F (36.6 C)  TempSrc:   Oral Oral  SpO2:      Weight:      Height:       FHR 120 with average variability and accels UCs every 4-85min  Dilation: 4 Effacement (%): 50 Cervical Position: Middle Station: -3 Presentation: Vertex Exam by:: Boyd Cabal, RN  Will continue to observe.  Patient does not want epidural yet, will give IV Fentanyl  prn

## 2023-09-27 NOTE — Discharge Summary (Signed)
 Patient discharge @ 2024 on 09/27/2022 in good condition with partner Arron in their private vehicle to their home. Reviewed with patient discharge paperwork and medications. Patient and partner educated on sign of preterm labor, premature rupture of membranes, and signs on labor. All questions answered and patient verbalized understanding. Peripheral IV removed and vital signs stable upon discharge.  Boyd Cabal, RN

## 2023-09-27 NOTE — Discharge Summary (Signed)
 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)   Mallory-Weiss tear  Hospital course: Patient presented to MAU 4/21 for report of dehydration and contractions. Cervix changed from 1.5 to 3 cm despite procardia , fluids, electrolyte repletion. UTI, vaginitis, PROM all ruled-out. Patient then admitted to L&D for preterm labor. Ultimately progressed to 4/50/ballotable and remained unchanged for 12+ hours. Sent home with procardia  and strict return precautions.  During stay patient also received zofran , reglan , phenergan , pepcid , haldol  for cannabis hyperemesis. Did have one episode of hematemesis. Was HDS and Hgb stable ruling out clinically significant UGIB. Suspect Mallory-Weiss tear. Haldol  ultimately most helpful. EKG with normal Qtc. Was discharged home with this.  Immunizations received: Immunization History  Administered Date(s) Administered   Influenza,inj,Quad PF,6+ Mos 08/22/2020   Tdap 08/22/2020, 09/07/2023    Physical exam  Vitals:   09/27/23 1851 09/27/23 1856 09/27/23 1900 09/27/23 1923  BP:    (!) 121/52  Pulse:    90  Resp:    16  Temp:    98.3 F (36.8 C)  TempSrc:    Oral  SpO2: 98% 97% 97% 97%  Weight:      Height:       General: alert, cooperative, and no distress Abdomen: Gravid, no tenderness to palpation, rebound, or guarding DVT Evaluation: No evidence of DVT seen on physical exam. Labs: Lab Results  Component Value Date   WBC 10.9 (H) 09/27/2023   HGB 10.9 (L)  09/27/2023   HCT 32.9 (L) 09/27/2023   MCV 95.1 09/27/2023   PLT 261 09/27/2023      Latest Ref Rng & Units 09/27/2023    6:16 PM  CMP  Glucose 70 - 99 mg/dL 82   BUN 6 - 20 mg/dL <5   Creatinine 7.82 - 1.00 mg/dL 9.56   Sodium 213 - 086 mmol/L 135   Potassium 3.5 - 5.1 mmol/L 3.9   Chloride 98 - 111 mmol/L 101   CO2 22 - 32 mmol/L 22   Calcium 8.9 - 10.3 mg/dL 8.9   Total Protein 6.5 - 8.1 g/dL 6.4   Total Bilirubin 0.0 - 1.2 mg/dL 1.7   Alkaline Phos 38 - 126 U/L 86   AST 15 - 41 U/L 22   ALT 0 - 44 U/L 15    Edinburgh Score:    12/11/2020   10:49 AM  Edinburgh Postnatal Depression Scale Screening Tool  I have been able to laugh and see the funny side of things. 0  I have looked forward with enjoyment to things. 0  I have blamed myself unnecessarily when things went wrong. 2  I have been anxious or worried for no good reason. 3  I have felt scared or panicky for no good reason. 2  Things have been getting on top of me. 1  I have been so unhappy that I have had difficulty sleeping. 0  I have felt sad or miserable. 1  I have been so unhappy that I have been crying. 1  The thought of harming myself has  occurred to me. 0  Edinburgh Postnatal Depression Scale Total 10   No data recorded  After visit meds:  Allergies as of 09/27/2023       Reactions   Ciprofloxacin Rash        Medication List     STOP taking these medications    dicyclomine  20 MG tablet Commonly known as: BENTYL    doxylamine  (Sleep) 25 MG tablet Commonly known as: UNISOM        TAKE these medications    aspirin  EC 81 MG tablet Take 2 tablets (162 mg total) by mouth daily.   Blood Pressure Monitor Misc For regular home bp monitoring during pregnancy   busPIRone  10 MG tablet Commonly known as: BUSPAR  TAKE 1 TABLET BY MOUTH TWICE A DAY   famotidine  20 MG tablet Commonly known as: Pepcid  Take 1 tablet (20 mg total) by mouth 2 (two) times daily.   haloperidol  0.5 MG tablet Commonly  known as: HALDOL  Take 1-2 tablets (0.5-1 mg total) by mouth every 6 (six) hours as needed (Vomiting). Take only if other medications have not worked   metoCLOPramide  10 MG tablet Commonly known as: REGLAN  Take 1 tablet (10 mg total) by mouth every 8 (eight) hours as needed.   NIFEdipine  10 MG capsule Commonly known as: PROCARDIA  Take 1 capsule (10 mg total) by mouth every 4 (four) hours as needed (contractions).   ondansetron  4 MG disintegrating tablet Commonly known as: ZOFRAN -ODT Take 1 tablet (4 mg total) by mouth every 8 (eight) hours as needed for nausea or vomiting.   PRENATAL VITAMIN PO Take by mouth.   pyridOXINE  50 MG tablet Commonly known as: VITAMIN B6 Take 1 tablet (50 mg total) by mouth 2 (two) times daily.   sertraline  100 MG tablet Commonly known as: ZOLOFT  TAKE 1 EVERY DAY         Discharge home in stable condition Discharge instruction: per After Visit Summary Diet: routine diet Future Appointments: Message sent to FT to schedule follow-up later this week/early next Future Appointments  Date Time Provider Department Center  10/20/2023 10:00 AM Rankin, Eather Golder, NP [facility] None   09/27/2023 Maud Sorenson, MD

## 2023-09-28 LAB — CULTURE, BETA STREP (GROUP B ONLY)

## 2023-09-29 ENCOUNTER — Encounter (HOSPITAL_COMMUNITY): Payer: Self-pay | Admitting: Obstetrics and Gynecology

## 2023-09-29 ENCOUNTER — Inpatient Hospital Stay (HOSPITAL_COMMUNITY)
Admission: AD | Admit: 2023-09-29 | Discharge: 2023-09-29 | Disposition: A | Discharge status: Home / Self Care | Attending: Obstetrics and Gynecology | Admitting: Obstetrics and Gynecology

## 2023-09-29 DIAGNOSIS — O4703 False labor before 37 completed weeks of gestation, third trimester: Secondary | ICD-10-CM | POA: Diagnosis present

## 2023-09-29 DIAGNOSIS — O479 False labor, unspecified: Secondary | ICD-10-CM | POA: Diagnosis not present

## 2023-09-29 DIAGNOSIS — Z3493 Encounter for supervision of normal pregnancy, unspecified, third trimester: Secondary | ICD-10-CM

## 2023-09-29 DIAGNOSIS — O133 Gestational [pregnancy-induced] hypertension without significant proteinuria, third trimester: Secondary | ICD-10-CM | POA: Diagnosis not present

## 2023-09-29 DIAGNOSIS — Z3A35 35 weeks gestation of pregnancy: Secondary | ICD-10-CM | POA: Diagnosis not present

## 2023-09-29 DIAGNOSIS — F12288 Cannabis dependence with other cannabis-induced disorder: Secondary | ICD-10-CM

## 2023-09-29 DIAGNOSIS — O99613 Diseases of the digestive system complicating pregnancy, third trimester: Secondary | ICD-10-CM | POA: Insufficient documentation

## 2023-09-29 DIAGNOSIS — K5900 Constipation, unspecified: Secondary | ICD-10-CM | POA: Insufficient documentation

## 2023-09-29 DIAGNOSIS — K226 Gastro-esophageal laceration-hemorrhage syndrome: Secondary | ICD-10-CM

## 2023-09-29 DIAGNOSIS — Z3689 Encounter for other specified antenatal screening: Secondary | ICD-10-CM

## 2023-09-29 LAB — URINALYSIS, ROUTINE W REFLEX MICROSCOPIC
Bilirubin Urine: NEGATIVE
Glucose, UA: NEGATIVE mg/dL
Hgb urine dipstick: NEGATIVE
Ketones, ur: NEGATIVE mg/dL
Nitrite: NEGATIVE
Protein, ur: NEGATIVE mg/dL
Specific Gravity, Urine: 1.017 (ref 1.005–1.030)
pH: 6 (ref 5.0–8.0)

## 2023-09-29 NOTE — MAU Note (Signed)
.  Sarah Rollins is a 23 y.o. at [redacted]w[redacted]d here in MAU reporting losing mucous plug yesterday and ctxs today. Was admitted 3 days ago for dehydration and ctxs. Was 4cm when she left after cervical change stopped. Denies VB or LOF. Reports good FM  LMP: n/a Onset of complaint: yesterday Pain score: 8 Vitals:   09/29/23 1953 09/29/23 1954  Pulse: (!) 117   SpO2:  97%     FHT: 126  Lab orders placed from triage: u/a

## 2023-09-29 NOTE — MAU Provider Note (Signed)
 Chief Complaint:  Contractions   HPI   Event Date/Time   First Provider Initiated Contact with Patient 09/29/23 2037      Sarah Rollins is a 23 y.o. G2P1001 at [redacted]w[redacted]d who presents to maternity admissions reporting contractions with vaginal pressure and loss of mucus plug. She reports that the main change from previous visit 2 days ago is her vaginal pressure. She reports good fluid intake (was previously dehydrated). Reports "really ready to be done with this pregnancy."  Pregnancy Course: Tachycardia in pregnancy,   Past Medical History:  Diagnosis Date   ADHD    Anxiety    Depression    Pregnancy induced hypertension    Vaginal Pap smear, abnormal    OB History  Gravida Para Term Preterm AB Living  2 1 1  0 0 1  SAB IAB Ectopic Multiple Live Births  0 0 0 0 1    # Outcome Date GA Lbr Len/2nd Weight Sex Type Anes PTL Lv  2 Current           1 Term 10/09/20 [redacted]w[redacted]d 16:54 / 01:34 3033 g M Vag-Spont EPI N LIV     Complications: Gestational hypertension   Past Surgical History:  Procedure Laterality Date   WISDOM TOOTH EXTRACTION     Family History  Problem Relation Age of Onset   Depression Sister    Anxiety disorder Sister    Hypertension Mother    Heart Problems Mother    Colon cancer Maternal Grandfather    Breast cancer Paternal Grandmother    Social History   Tobacco Use   Smoking status: Never   Smokeless tobacco: Never  Vaping Use   Vaping status: Never Used  Substance Use Topics   Alcohol use: Not Currently   Drug use: Not Currently    Types: Marijuana    Comment: daily   Allergies  Allergen Reactions   Ciprofloxacin Rash   Medications Prior to Admission  Medication Sig Dispense Refill Last Dose/Taking   busPIRone  (BUSPAR ) 10 MG tablet TAKE 1 TABLET BY MOUTH TWICE A DAY 180 tablet 2 09/29/2023   famotidine  (PEPCID ) 20 MG tablet Take 1 tablet (20 mg total) by mouth 2 (two) times daily. 60 tablet 0 09/29/2023   haloperidol  (HALDOL ) 0.5 MG tablet Take 1-2  tablets (0.5-1 mg total) by mouth every 6 (six) hours as needed (Vomiting). Take only if other medications have not worked 60 tablet 0 Past Week   metoCLOPramide  (REGLAN ) 10 MG tablet Take 1 tablet (10 mg total) by mouth every 8 (eight) hours as needed. 30 tablet 1 09/29/2023   ondansetron  (ZOFRAN -ODT) 4 MG disintegrating tablet Take 1 tablet (4 mg total) by mouth every 8 (eight) hours as needed for nausea or vomiting. 30 tablet 1 09/29/2023   Prenatal Vit-Fe Fumarate-FA (PRENATAL VITAMIN PO) Take by mouth.   09/29/2023   sertraline  (ZOLOFT ) 100 MG tablet TAKE 1 EVERY DAY 180 tablet 3 09/29/2023   aspirin  EC 81 MG tablet Take 2 tablets (162 mg total) by mouth daily. 60 tablet 6 Unknown   Blood Pressure Monitor MISC For regular home bp monitoring during pregnancy 1 each 0    NIFEdipine  (PROCARDIA ) 10 MG capsule Take 1 capsule (10 mg total) by mouth every 4 (four) hours as needed (contractions). 60 capsule 0 Unknown   pyridOXINE  (VITAMIN B6) 50 MG tablet Take 1 tablet (50 mg total) by mouth 2 (two) times daily. 60 tablet 0     I have reviewed patient's Past Medical Hx, Surgical  Hx, Family Hx, Social Hx, medications and allergies.   ROS  Pertinent items noted in HPI and remainder of comprehensive ROS otherwise negative.   PHYSICAL EXAM  Patient Vitals for the past 24 hrs:  BP Pulse SpO2 Height Weight  09/29/23 2030 -- -- 98 % -- --  09/29/23 2025 -- -- 96 % -- --  09/29/23 2024 116/78 (!) 109 -- -- --  09/29/23 2020 -- -- 97 % -- --  09/29/23 2015 -- -- 97 % -- --  09/29/23 1954 -- -- 97 % 5' (1.524 m) 72.1 kg  09/29/23 1953 -- (!) 117 -- -- --    Physical Exam Vitals and nursing note reviewed.  Constitutional:      General: She is not in acute distress.    Appearance: Normal appearance. She is normal weight. She is not ill-appearing, toxic-appearing or diaphoretic.  HENT:     Head: Normocephalic.  Cardiovascular:     Rate and Rhythm: Normal rate.  Pulmonary:     Effort: Pulmonary  effort is normal.  Genitourinary:    General: Normal vulva.  Skin:    General: Skin is warm.     Capillary Refill: Capillary refill takes less than 2 seconds.  Neurological:     General: No focal deficit present.     Mental Status: She is alert and oriented to person, place, and time.  Psychiatric:        Mood and Affect: Mood normal.        Behavior: Behavior normal.        Thought Content: Thought content normal.        Judgment: Judgment normal.      Dilation: 3.5 Effacement (%): 50 Station: Ballotable Presentation: Vertex Exam by:: S. Warren-Hill,CNM  Fetal Tracing: Baseline: 120  Variability:moderate Accelerations: 15x15 Decelerations:none Toco: 1-3   Labs: Results for orders placed or performed during the hospital encounter of 09/29/23 (from the past 24 hours)  Urinalysis, Routine w reflex microscopic -Urine, Clean Catch     Status: Abnormal   Collection Time: 09/29/23  8:05 PM  Result Value Ref Range   Color, Urine YELLOW YELLOW   APPearance CLOUDY (A) CLEAR   Specific Gravity, Urine 1.017 1.005 - 1.030   pH 6.0 5.0 - 8.0   Glucose, UA NEGATIVE NEGATIVE mg/dL   Hgb urine dipstick NEGATIVE NEGATIVE   Bilirubin Urine NEGATIVE NEGATIVE   Ketones, ur NEGATIVE NEGATIVE mg/dL   Protein, ur NEGATIVE NEGATIVE mg/dL   Nitrite NEGATIVE NEGATIVE   Leukocytes,Ua SMALL (A) NEGATIVE   RBC / HPF 0-5 0 - 5 RBC/hpf   WBC, UA 11-20 0 - 5 WBC/hpf   Bacteria, UA RARE (A) NONE SEEN   Squamous Epithelial / HPF 6-10 0 - 5 /HPF   Mucus PRESENT    Ca Oxalate Crys, UA PRESENT     Imaging:  No results found.  MDM & MAU COURSE  MDM: Evaluation for labor. No cervical change in 2 days, since previous admission.  MAU Course: Orders Placed This Encounter  Procedures   Urinalysis, Routine w reflex microscopic -Urine, Clean Catch   Discharge patient Discharge disposition: 01-Home or Self Care; Discharge patient date: 09/29/2023   No orders of the defined types were placed in  this encounter.   ASSESSMENT   False labor - No cervical change, contractions essentially unchanged. - Pressure unrelated to fetal station - ballotable.   Constipation, unspecified constipation type - Reports pressure and infrequent BM   Movement of fetus present during  pregnancy in third trimester  NST (non-stress test) reactive   PLAN  Discharge home in stable condition.  Strict precautions for preterm labor. Suggest procardia  at home PRN. Reeducated on purpose of medication and optimal fetal outcomes with birth after [redacted] weeks gestation. Recommended fiber, fluid and stool softeners PRN.     Allergies as of 09/29/2023       Reactions   Ciprofloxacin Rash        Medication List     TAKE these medications    aspirin  EC 81 MG tablet Take 2 tablets (162 mg total) by mouth daily.   Blood Pressure Monitor Misc For regular home bp monitoring during pregnancy   busPIRone  10 MG tablet Commonly known as: BUSPAR  TAKE 1 TABLET BY MOUTH TWICE A DAY   famotidine  20 MG tablet Commonly known as: Pepcid  Take 1 tablet (20 mg total) by mouth 2 (two) times daily.   haloperidol  0.5 MG tablet Commonly known as: HALDOL  Take 1-2 tablets (0.5-1 mg total) by mouth every 6 (six) hours as needed (Vomiting). Take only if other medications have not worked   metoCLOPramide  10 MG tablet Commonly known as: REGLAN  Take 1 tablet (10 mg total) by mouth every 8 (eight) hours as needed.   NIFEdipine  10 MG capsule Commonly known as: PROCARDIA  Take 1 capsule (10 mg total) by mouth every 4 (four) hours as needed (contractions).   ondansetron  4 MG disintegrating tablet Commonly known as: ZOFRAN -ODT Take 1 tablet (4 mg total) by mouth every 8 (eight) hours as needed for nausea or vomiting.   PRENATAL VITAMIN PO Take by mouth.   pyridOXINE  50 MG tablet Commonly known as: VITAMIN B6 Take 1 tablet (50 mg total) by mouth 2 (two) times daily.   sertraline  100 MG tablet Commonly known as:  ZOLOFT  TAKE 1 EVERY DAY        Raford Bunk, MSN, CNM 09/29/2023 9:26 PM  Certified Nurse Midwife, [facility]

## 2023-10-02 ENCOUNTER — Inpatient Hospital Stay (HOSPITAL_COMMUNITY)
Admission: AD | Admit: 2023-10-02 | Discharge: 2023-10-02 | Disposition: A | Discharge status: Home / Self Care | Source: Home / Self Care | Attending: Obstetrics and Gynecology | Admitting: Obstetrics and Gynecology

## 2023-10-02 ENCOUNTER — Encounter (HOSPITAL_COMMUNITY): Payer: Self-pay | Admitting: Obstetrics and Gynecology

## 2023-10-02 ENCOUNTER — Other Ambulatory Visit: Payer: Self-pay

## 2023-10-02 DIAGNOSIS — O4703 False labor before 37 completed weeks of gestation, third trimester: Secondary | ICD-10-CM | POA: Insufficient documentation

## 2023-10-02 DIAGNOSIS — Z3A36 36 weeks gestation of pregnancy: Secondary | ICD-10-CM | POA: Insufficient documentation

## 2023-10-02 MED ORDER — MORPHINE SULFATE (PF) 4 MG/ML IV SOLN
4.0000 mg | Freq: Once | INTRAVENOUS | Status: AC
  Administered 2023-10-02: 4 mg via INTRAMUSCULAR
  Filled 2023-10-02: qty 1

## 2023-10-02 NOTE — MAU Provider Note (Signed)
  S: [name] is a 23 y.o. G2P1001 at [redacted]w[redacted]d  who presents to MAU today complaining contractions q 3-5 minutes since 1900. She denies vaginal bleeding. She denies LOF. She reports normal fetal movement.    O: Ht 5' (1.524 m)   Wt 72.6 kg   BMI 31.25 kg/m  RN labor eval  Cervical exam:  Dilation: 5 Effacement (%): 50 Station: -3 Presentation: Vertex Exam by:: Bria Torrence   Fetal Monitoring: Baseline: 135 Variability: mod Accelerations: present Decelerations: absent Contractions: every 3-4 min   A/P: [facility] at [redacted]w[redacted]d here in latent labor, suspect prolonged phase -- pt admitted 4/22 for preterm labor, progressed to 4cm then remained unchanged. She returns to MAU today, cervix 4.5cm on initial evaluation, repeat 5cm on two separate checks. Suspect prolonged latent phase. Discussed option for continued monitoring and recheck vs return home w therapeutic rest. Pt opted for therapeutic rest. Cat I tracing. Will give IM morphine  and discharge w return precautions.   Melanie Spires, MD 10/02/2023 5:37 AM

## 2023-10-03 ENCOUNTER — Inpatient Hospital Stay (HOSPITAL_COMMUNITY): Admitting: Anesthesiology

## 2023-10-03 ENCOUNTER — Encounter (HOSPITAL_COMMUNITY): Payer: Self-pay | Admitting: Family Medicine

## 2023-10-03 ENCOUNTER — Encounter: Payer: Self-pay | Admitting: Obstetrics & Gynecology

## 2023-10-03 ENCOUNTER — Ambulatory Visit: Admitting: Obstetrics & Gynecology

## 2023-10-03 ENCOUNTER — Inpatient Hospital Stay (HOSPITAL_COMMUNITY)
Admission: AD | Admit: 2023-10-03 | Discharge: 2023-10-05 | DRG: 806 | Disposition: A | Discharge status: Home / Self Care | Payer: Self-pay | Attending: Obstetrics and Gynecology | Admitting: Obstetrics and Gynecology

## 2023-10-03 ENCOUNTER — Other Ambulatory Visit: Payer: Self-pay

## 2023-10-03 VITALS — BP 111/79 | HR 105 | Wt 157.0 lb

## 2023-10-03 DIAGNOSIS — Z3A36 36 weeks gestation of pregnancy: Secondary | ICD-10-CM

## 2023-10-03 DIAGNOSIS — F329 Major depressive disorder, single episode, unspecified: Secondary | ICD-10-CM | POA: Diagnosis present

## 2023-10-03 DIAGNOSIS — O42113 Preterm premature rupture of membranes, onset of labor more than 24 hours following rupture, third trimester: Secondary | ICD-10-CM

## 2023-10-03 DIAGNOSIS — Z8249 Family history of ischemic heart disease and other diseases of the circulatory system: Secondary | ICD-10-CM | POA: Diagnosis not present

## 2023-10-03 DIAGNOSIS — F411 Generalized anxiety disorder: Secondary | ICD-10-CM | POA: Diagnosis present

## 2023-10-03 DIAGNOSIS — Z3483 Encounter for supervision of other normal pregnancy, third trimester: Secondary | ICD-10-CM

## 2023-10-03 DIAGNOSIS — Z8751 Personal history of pre-term labor: Secondary | ICD-10-CM | POA: Diagnosis present

## 2023-10-03 DIAGNOSIS — O4202 Full-term premature rupture of membranes, onset of labor within 24 hours of rupture: Secondary | ICD-10-CM

## 2023-10-03 DIAGNOSIS — O99324 Drug use complicating childbirth: Secondary | ICD-10-CM | POA: Diagnosis present

## 2023-10-03 DIAGNOSIS — O42913 Preterm premature rupture of membranes, unspecified as to length of time between rupture and onset of labor, third trimester: Secondary | ICD-10-CM | POA: Diagnosis present

## 2023-10-03 DIAGNOSIS — F1210 Cannabis abuse, uncomplicated: Secondary | ICD-10-CM | POA: Diagnosis present

## 2023-10-03 DIAGNOSIS — Z79899 Other long term (current) drug therapy: Secondary | ICD-10-CM | POA: Diagnosis not present

## 2023-10-03 DIAGNOSIS — O99344 Other mental disorders complicating childbirth: Secondary | ICD-10-CM | POA: Diagnosis present

## 2023-10-03 DIAGNOSIS — O42013 Preterm premature rupture of membranes, onset of labor within 24 hours of rupture, third trimester: Secondary | ICD-10-CM | POA: Diagnosis present

## 2023-10-03 LAB — COMPREHENSIVE METABOLIC PANEL WITH GFR
ALT: 16 U/L (ref 0–44)
AST: 19 U/L (ref 15–41)
Albumin: 2.7 g/dL — ABNORMAL LOW (ref 3.5–5.0)
Alkaline Phosphatase: 84 U/L (ref 38–126)
Anion gap: 9 (ref 5–15)
BUN: 5 mg/dL — ABNORMAL LOW (ref 6–20)
CO2: 23 mmol/L (ref 22–32)
Calcium: 8.8 mg/dL — ABNORMAL LOW (ref 8.9–10.3)
Chloride: 101 mmol/L (ref 98–111)
Creatinine, Ser: 0.57 mg/dL (ref 0.44–1.00)
GFR, Estimated: >60 mL/min (ref >60)
Glucose, Bld: 79 mg/dL (ref 70–99)
Potassium: 4 mmol/L (ref 3.5–5.1)
Sodium: 133 mmol/L — ABNORMAL LOW (ref 135–145)
Total Bilirubin: 0.6 mg/dL (ref 0.0–1.2)
Total Protein: 5.9 g/dL — ABNORMAL LOW (ref 6.5–8.1)

## 2023-10-03 LAB — CBC
HCT: 35.4 % — ABNORMAL LOW (ref 36.0–46.0)
Hemoglobin: 12.2 g/dL (ref 12.0–15.0)
MCH: 33.2 pg (ref 26.0–34.0)
MCHC: 34.5 g/dL (ref 30.0–36.0)
MCV: 96.2 fL (ref 80.0–100.0)
Platelets: 295 10*3/uL (ref 150–400)
RBC: 3.68 MIL/uL — ABNORMAL LOW (ref 3.87–5.11)
RDW: 12.4 % (ref 11.5–15.5)
WBC: 13.1 10*3/uL — ABNORMAL HIGH (ref 4.0–10.5)
nRBC: 0 % (ref 0.0–0.2)

## 2023-10-03 LAB — TYPE AND SCREEN
ABO/RH(D): O POS
Antibody Screen: NEGATIVE

## 2023-10-03 LAB — RPR: RPR Ser Ql: NONREACTIVE

## 2023-10-03 MED ORDER — LIDOCAINE HCL (PF) 1 % IJ SOLN
INTRAMUSCULAR | Status: DC | PRN
  Administered 2023-10-03: 11 mL via EPIDURAL

## 2023-10-03 MED ORDER — FLEET ENEMA RE ENEM: 1.0000 | ENEMA | RECTAL | Status: DC | PRN

## 2023-10-03 MED ORDER — PHENYLEPHRINE 80 MCG/ML (10ML) SYRINGE FOR IV PUSH (FOR BLOOD PRESSURE SUPPORT)
80.0000 ug | PREFILLED_SYRINGE | INTRAVENOUS | Status: DC | PRN
  Administered 2023-10-03 (×2): 80 ug via INTRAVENOUS
  Filled 2023-10-03: qty 10

## 2023-10-03 MED ORDER — FENTANYL CITRATE (PF) 100 MCG/2ML IJ SOLN
50.0000 ug | INTRAMUSCULAR | Status: DC | PRN
  Administered 2023-10-03: 100 ug via INTRAVENOUS
  Filled 2023-10-03: qty 2

## 2023-10-03 MED ORDER — EPHEDRINE 5 MG/ML INJ: 10.0000 mg | INTRAVENOUS | Status: DC | PRN

## 2023-10-03 MED ORDER — ACETAMINOPHEN 325 MG PO TABS: 650.0000 mg | ORAL_TABLET | ORAL | Status: DC | PRN

## 2023-10-03 MED ORDER — OXYTOCIN-SODIUM CHLORIDE 30-0.9 UT/500ML-% IV SOLN
2.5000 [IU]/h | INTRAVENOUS | Status: DC
  Administered 2023-10-03: 2.5 [IU]/h via INTRAVENOUS

## 2023-10-03 MED ORDER — HALOPERIDOL 0.5 MG PO TABS
0.5000 mg | ORAL_TABLET | Freq: Three times a day (TID) | ORAL | Status: DC | PRN
  Administered 2023-10-03 (×2): 0.5 mg via ORAL
  Filled 2023-10-03 (×4): qty 1

## 2023-10-03 MED ORDER — SODIUM CHLORIDE 0.9 % IV SOLN: 5.0000 10*6.[IU] | Freq: Once | INTRAVENOUS | Status: DC

## 2023-10-03 MED ORDER — TERBUTALINE SULFATE 1 MG/ML IJ SOLN: 0.2500 mg | Freq: Once | INTRAMUSCULAR | Status: DC | PRN

## 2023-10-03 MED ORDER — DIPHENHYDRAMINE HCL 50 MG/ML IJ SOLN: 12.5000 mg | INTRAMUSCULAR | Status: DC | PRN

## 2023-10-03 MED ORDER — FENTANYL CITRATE (PF) 100 MCG/2ML IJ SOLN
50.0000 ug | INTRAMUSCULAR | Status: AC | PRN
Start: 2023-10-03 — End: 2023-10-03
  Administered 2023-10-03: 50 ug via INTRAVENOUS
  Filled 2023-10-03: qty 2

## 2023-10-03 MED ORDER — OXYTOCIN BOLUS FROM INFUSION
333.0000 mL | Freq: Once | INTRAVENOUS | Status: AC
  Administered 2023-10-03: 333 mL via INTRAVENOUS

## 2023-10-03 MED ORDER — PENICILLIN G POT IN DEXTROSE 60000 UNIT/ML IV SOLN: 3.0000 10*6.[IU] | INTRAVENOUS | Status: DC

## 2023-10-03 MED ORDER — LIDOCAINE HCL (PF) 1 % IJ SOLN: 30.0000 mL | INTRAMUSCULAR | Status: DC | PRN

## 2023-10-03 MED ORDER — BUSPIRONE HCL 5 MG PO TABS
10.0000 mg | ORAL_TABLET | Freq: Two times a day (BID) | ORAL | Status: DC
  Administered 2023-10-03: 10 mg via ORAL
  Filled 2023-10-03 (×2): qty 1

## 2023-10-03 MED ORDER — FAMOTIDINE IN NACL 20-0.9 MG/50ML-% IV SOLN
20.0000 mg | Freq: Once | INTRAVENOUS | Status: AC
  Administered 2023-10-03: 20 mg via INTRAVENOUS
  Filled 2023-10-03: qty 50

## 2023-10-03 MED ORDER — SOD CITRATE-CITRIC ACID 500-334 MG/5ML PO SOLN: 30.0000 mL | ORAL | Status: DC | PRN

## 2023-10-03 MED ORDER — FENTANYL-BUPIVACAINE-NACL 0.5-0.125-0.9 MG/250ML-% EP SOLN
12.0000 mL/h | EPIDURAL | Status: DC | PRN
  Administered 2023-10-03: 12 mL/h via EPIDURAL
  Filled 2023-10-03: qty 250

## 2023-10-03 MED ORDER — OXYTOCIN-SODIUM CHLORIDE 30-0.9 UT/500ML-% IV SOLN
1.0000 m[IU]/min | INTRAVENOUS | Status: DC
  Administered 2023-10-03: 2 m[IU]/min via INTRAVENOUS
  Filled 2023-10-03: qty 500

## 2023-10-03 MED ORDER — OXYCODONE-ACETAMINOPHEN 5-325 MG PO TABS: 1.0000 | ORAL_TABLET | ORAL | Status: DC | PRN

## 2023-10-03 MED ORDER — OXYCODONE-ACETAMINOPHEN 5-325 MG PO TABS: 2.0000 | ORAL_TABLET | ORAL | Status: DC | PRN

## 2023-10-03 MED ORDER — LACTATED RINGERS IV SOLN
500.0000 mL | Freq: Once | INTRAVENOUS | Status: AC
  Administered 2023-10-03: 500 mL via INTRAVENOUS

## 2023-10-03 MED ORDER — SERTRALINE HCL 100 MG PO TABS
100.0000 mg | ORAL_TABLET | Freq: Every day | ORAL | Status: DC
  Administered 2023-10-03: 100 mg via ORAL
  Filled 2023-10-03: qty 1

## 2023-10-03 MED ORDER — LACTATED RINGERS IV SOLN
500.0000 mL | INTRAVENOUS | Status: DC | PRN
  Administered 2023-10-03 (×3): 500 mL via INTRAVENOUS

## 2023-10-03 MED ORDER — PHENYLEPHRINE 80 MCG/ML (10ML) SYRINGE FOR IV PUSH (FOR BLOOD PRESSURE SUPPORT): 80.0000 ug | PREFILLED_SYRINGE | INTRAVENOUS | Status: DC | PRN

## 2023-10-03 MED ORDER — ONDANSETRON HCL 4 MG/2ML IJ SOLN
4.0000 mg | Freq: Four times a day (QID) | INTRAMUSCULAR | Status: DC | PRN
  Administered 2023-10-03 (×2): 4 mg via INTRAVENOUS
  Filled 2023-10-03 (×2): qty 2

## 2023-10-03 MED ORDER — OXYTOCIN-SODIUM CHLORIDE 30-0.9 UT/500ML-% IV SOLN: 1.0000 m[IU]/min | INTRAVENOUS | Status: DC

## 2023-10-03 MED ORDER — LACTATED RINGERS IV SOLN
INTRAVENOUS | Status: DC
  Administered 2023-10-03: 125 mL via INTRAVENOUS

## 2023-10-03 NOTE — Plan of Care (Signed)

## 2023-10-03 NOTE — Discharge Summary (Signed)
 Postpartum Discharge Summary  Date of Service updated***     Patient Name: Sarah Rollins DOB: 2000-09-17 MRN: [id number]  Date of admission: 10/03/2023 Delivery date:10/03/2023 Delivering provider: Majel Scott Date of discharge: 10/03/2023  Admitting diagnosis: Preterm premature rupture of membranes (PPROM) with onset of labor within 24 hours of rupture in third trimester, antepartum [O42.013] Intrauterine pregnancy: [redacted]w[redacted]d     Secondary diagnosis:  Active Problems:   Preterm premature rupture of membranes (PPROM) with onset of labor within 24 hours of rupture in third trimester, antepartum  Additional problems: Sinus tachycardia; depression and anxiety, cannabidiol hyperemesis    Discharge diagnosis: Preterm Pregnancy Delivered                                              Post partum procedures:{Postpartum procedures:23558} Augmentation: Pitocin  Complications: None  Hospital course: Onset of Labor With Vaginal Delivery      23 y.o. yo G2P1001 at [redacted]w[redacted]d was admitted in Latent Labor on 10/03/2023. Labor course was complicated by nothing  Membrane Rupture Time/Date: 7:23 PM,10/03/2023  Delivery Method:Vaginal, Spontaneous Operative Delivery:N/A Episiotomy: None Lacerations:  None Patient had a postpartum course complicated by ***.  She is ambulating, tolerating a regular diet, passing flatus, and urinating well. Patient is discharged home in stable condition on 10/03/23.  Newborn Data: Birth date:10/03/2023 Birth time:9:58 PM Gender:Female Living status:Living Apgars: ,  Weight:   Magnesium  Sulfate received: No BMZ received: No Rhophylac:N/A MMR:N/A T-DaP:Given prenatally Flu: N/A RSV Vaccine received: No Transfusion:{Transfusion received:30440034}  Immunizations received: Immunization History  Administered Date(s) Administered   Influenza,inj,Quad PF,6+ Mos 08/22/2020   Tdap 08/22/2020, 09/07/2023    Physical exam  Vitals:   10/03/23 2001 10/03/23  2030 10/03/23 2100 10/03/23 2119  BP: 109/70 105/71 122/73   Pulse: 87 (!) 115 78   Resp: 18 18 18    Temp:    97.9 F (36.6 C)  TempSrc:    Oral  SpO2: 100% 100%    Weight:      Height:       General: {Exam; general:21111117} Lochia: {Desc; appropriate/inappropriate:30686::"appropriate"} Uterine Fundus: {Desc; firm/soft:30687} Incision: {Exam; incision:21111123} DVT Evaluation: {Exam; dvt:2111122} Labs: Lab Results  Component Value Date   WBC 13.1 (H) 10/03/2023   HGB 12.2 10/03/2023   HCT 35.4 (L) 10/03/2023   MCV 96.2 10/03/2023   PLT 295 10/03/2023      Latest Ref Rng & Units 10/03/2023    1:06 PM  CMP  Glucose 70 - 99 mg/dL 79   BUN 6 - 20 mg/dL 5   Creatinine 8.29 - 5.62 mg/dL 1.30   Sodium 865 - 784 mmol/L 133   Potassium 3.5 - 5.1 mmol/L 4.0   Chloride 98 - 111 mmol/L 101   CO2 22 - 32 mmol/L 23   Calcium 8.9 - 10.3 mg/dL 8.8   Total Protein 6.5 - 8.1 g/dL 5.9   Total Bilirubin 0.0 - 1.2 mg/dL 0.6   Alkaline Phos 38 - 126 U/L 84   AST 15 - 41 U/L 19   ALT 0 - 44 U/L 16    Edinburgh Score:    12/11/2020   10:49 AM  Edinburgh Postnatal Depression Scale Screening Tool  I have been able to laugh and see the funny side of things. 0  I have looked forward with enjoyment to things. 0  I have blamed myself unnecessarily when  things went wrong. 2  I have been anxious or worried for no good reason. 3  I have felt scared or panicky for no good reason. 2  Things have been getting on top of me. 1  I have been so unhappy that I have had difficulty sleeping. 0  I have felt sad or miserable. 1  I have been so unhappy that I have been crying. 1  The thought of harming myself has occurred to me. 0  Edinburgh Postnatal Depression Scale Total 10   No data recorded  After visit meds:  Allergies as of 10/03/2023       Reactions   Ciprofloxacin Rash     Med Rec must be completed prior to using this [facility]***        Discharge home in stable condition Infant  Feeding: Breast Infant Disposition:{CHL IP OB HOME WITH ZOXWRU:04540} Discharge instruction: per After Visit Summary and Postpartum booklet. Activity: Advance as tolerated. Pelvic rest for 6 weeks.  Diet: {OB JWJX:[id number]} Future Appointments: Future Appointments  Date Time Provider Department Center  10/20/2023 10:00 AM Rankin, Eather Golder, NP [facility] None   Follow up Visit:   Please schedule this patient for a Virtual postpartum visit in 4 weeks with the following provider: Any provider. Additional Postpartum F/U: mood check (virtual) 2 weeks  Low risk pregnancy complicated by:  Delivery mode:  Vaginal, Spontaneous Anticipated Birth Control:  POPs   10/03/2023 Majel Scott, CNM

## 2023-10-03 NOTE — Anesthesia Preprocedure Evaluation (Signed)
 Anesthesia Evaluation  Patient identified by MRN, date of birth, ID band Patient awake    Reviewed: Allergy & Precautions, H&P , NPO status , Patient's Chart, lab work & pertinent test results  Airway Mallampati: II  TM Distance: >3 FB Neck ROM: Full    Dental no notable dental hx.    Pulmonary neg pulmonary ROS   Pulmonary exam normal breath sounds clear to auscultation       Cardiovascular Exercise Tolerance: Good hypertension, negative cardio ROS Normal cardiovascular exam Rhythm:Regular Rate:Normal     Neuro/Psych negative neurological ROS  negative psych ROS   GI/Hepatic negative GI ROS, Neg liver ROS,,,  Endo/Other  negative endocrine ROS    Renal/GU negative Renal ROS  negative genitourinary   Musculoskeletal negative musculoskeletal ROS (+)    Abdominal   Peds negative pediatric ROS (+)  Hematology negative hematology ROS (+)   Anesthesia Other Findings   Reproductive/Obstetrics (+) Pregnancy                              Anesthesia Physical Anesthesia Plan  ASA: III  Anesthesia Plan: Epidural   Post-op Pain Management:    Induction:   PONV Risk Score and Plan: 2 and Treatment may vary due to age or medical condition  Airway Management Planned: Natural Airway  Additional Equipment:   Intra-op Plan:   Post-operative Plan:   Informed Consent: I have reviewed the patients History and Physical, chart, labs and discussed the procedure including the risks, benefits and alternatives for the proposed anesthesia with the patient or authorized representative who has indicated his/her understanding and acceptance.       Plan Discussed with: Anesthesiologist  Anesthesia Plan Comments:          Anesthesia Quick Evaluation

## 2023-10-03 NOTE — Anesthesia Procedure Notes (Signed)
 Epidural Patient location during procedure: OB Start time: 10/03/2023 3:02 PM End time: 10/03/2023 3:19 PM  Staffing Anesthesiologist: Earvin Goldberg, MD Performed: anesthesiologist   Preanesthetic Checklist Completed: patient identified, IV checked, site marked, risks and benefits discussed, surgical consent, monitors and equipment checked, pre-op evaluation and timeout performed  Epidural Patient position: sitting Prep: ChloraPrep Patient monitoring: heart rate, cardiac monitor, continuous pulse ox and blood pressure Approach: midline Location: L2-L3 Injection technique: LOR saline  Needle:  Needle type: Tuohy  Needle gauge: 17 G Needle length: 9 cm Needle insertion depth: 6 cm Catheter type: closed end flexible Catheter size: 20 Guage Catheter at skin depth: 10 cm Test dose: negative  Assessment Events: blood not aspirated, injection not painful, no injection resistance, no paresthesia and negative IV test  Additional Notes Reason for block:procedure for pain

## 2023-10-03 NOTE — Progress Notes (Signed)
 LABOR PROGRESS NOTE  Patient Name: Sarah Rollins, female   DOB: Oct 21, 2000, 23 y.o.  MRN: [id number]  Pt comfortable following epidural.  Agreeable to AROM after counseling.  Her and baby tolerated well.  AROM with copious thin mec stained fluid. Mom informed, no questions / concerns at this time. Cat 1 strip.   Ebony Goldstein, MD

## 2023-10-03 NOTE — H&P (Signed)
 OBSTETRIC ADMISSION HISTORY AND PHYSICAL  Jerni Darwin is a 23 y.o. female G2P1001 with IUP at [redacted]w[redacted]d by US  presenting for PPROM/PTL. She reports +FMs, No LOF, no VB, no blurry vision, headaches or peripheral edema, and RUQ pain.  She plans on breast feeding. She request POPS for birth control. She received her prenatal care at  [facility]    Dating: By US  --->  Estimated Date of Delivery: 10/29/23  Sono:    @[redacted]w[redacted]d , CWD, normal anatomy, breech presentation, posterior placental lie, 548g, 21.5% EFW   Prenatal History/Complications:  Patient Active Problem List   Diagnosis Date Noted   Preterm premature rupture of membranes (PPROM) with onset of labor within 24 hours of rupture in third trimester, antepartum 10/03/2023   Cannabis hyperemesis syndrome concurrent with and due to cannabis dependence (HCC) 09/27/2023   Mallory-Weiss tear 09/27/2023   Preterm labor 09/26/2023   Dehydration during pregnancy 08/24/2023   Chlamydia infection affecting pregnancy 05/19/2023   Encounter for supervision of normal pregnancy, antepartum 05/12/2023   Anxiety and depression 04/23/2022   History of postpartum depression 11/13/2020   History of gestational hypertension 10/08/2020   Abnormal Pap smear of cervix 05/14/2020   Marijuana use 04/16/2020   Sinus tachycardia 05/29/2017   ADHD 06/12/2016   NURSING  PROVIDER  Office Location [facility] Dating by U/S at 7 wks  [facility] Model Traditional Anatomy U/S Normal female '[place]'  Initiated care at  16wks                 Language  English               LAB RESULTS   Support Person   Genetics NIPS: LR girl AFP: neg      NT/IT ([facility] only) Too late      Carrier Screen Horizon: 04/16/20 neg  Rhogam  O/Positive/-- (12/09 1504)N/a A1C/GTT Early: 5.0 Third trimester: wnl  Flu Vaccine declined      TDaP Vaccine  09/07/23  Blood Type O/Positive/-- (12/09 1504)  Covid Vaccine   Antibody Negative (12/09 1504)  RSV Vaccine   Rubella 2.13 (12/09 1504)  Feeding Plan  breast RPR Non Reactive (12/09 1504)  Contraception POPs HBsAg Negative (12/09 1504)  Circumcision N/a HIV Non Reactive (12/09 1504)  Pediatrician  [facility] HCVAb Non Reactive (12/09 1504)  Prenatal Classes discussed      BTL Consent   Pap 07/05/23: ASCUS w/ +HRHPV (other), pap in 24yr  BTL Pre-payment   GC/CT Initial:  -/+  POC:  -/- 36wks:    VBAC Consent N/a GBS For PCN allergy, check sensitivities            DME Rx [ x] BP cuff [ ]  Weight Scale Waterbirth  [ ]  Class [ ]  Consent [ ]  CNM visit  PHQ9 & GAD7 [ x ] new OB [  ] 28 weeks  [  ] 36 weeks Induction  [ ]  Orders Entered [ ] Foley Y/N     Past Medical History: Past Medical History:  Diagnosis Date   ADHD    Anxiety    Depression    Pregnancy induced hypertension    Vaginal Pap smear, abnormal     Past Surgical History: Past Surgical History:  Procedure Laterality Date   WISDOM TOOTH EXTRACTION      Obstetrical History: OB History     Gravida  2   Para  1   Term  1   Preterm  0   AB  0  Living  1      SAB  0   IAB  0   Ectopic  0   Multiple  0   Live Births  1           Social History Social History   Socioeconomic History   Marital status: Single    Spouse name: Not on file   Number of children: Not on file   Years of education: Not on file   Highest education level: Not on file  Occupational History   Not on file  Tobacco Use   Smoking status: Never   Smokeless tobacco: Never  Vaping Use   Vaping status: Never Used  Substance and Sexual Activity   Alcohol use: Not Currently   Drug use: Not Currently    Types: Marijuana    Comment: daily   Sexual activity: Yes    Birth control/protection: None  Other Topics Concern   Not on file  Social History Narrative   Not on file   Social Drivers of Health   Financial Resource Strain: Low Risk  (05/16/2023)   Overall Financial Resource Strain (CARDIA)    Difficulty of Paying Living Expenses: Not hard at all  Food Insecurity:  No Food Insecurity (09/26/2023)   Hunger Vital Sign    Worried About Running Out of Food in the Last Year: Never true    Ran Out of Food in the Last Year: Never true  Transportation Needs: No Transportation Needs (09/26/2023)   PRAPARE - Administrator, Civil Service (Medical): No    Lack of Transportation (Non-Medical): No  Physical Activity: Insufficiently Active (05/16/2023)   Exercise Vital Sign    Days of Exercise per Week: 4 days    Minutes of Exercise per Session: 20 min  Stress: Stress Concern Present (05/16/2023)   [employer] of Occupational Health - Occupational Stress Questionnaire    Feeling of Stress : Very much  Social Connections: Socially Isolated (05/16/2023)   Social Connection and Isolation Panel [NHANES]    Frequency of Communication with Friends and Family: Once a week    Frequency of Social Gatherings with Friends and Family: Once a week    Attends Religious Services: 1 to 4 times per year    Active Member of [employer] or Organizations: No    Attends Engineer, structural: Never    Marital Status: Never married    Family History: Family History  Problem Relation Age of Onset   Depression Sister    Anxiety disorder Sister    Hypertension Mother    Heart Problems Mother    Colon cancer Maternal Grandfather    Breast cancer Paternal Grandmother     Allergies: Allergies  Allergen Reactions   Ciprofloxacin Rash    Medications Prior to Admission  Medication Sig Dispense Refill Last Dose/Taking   aspirin  EC 81 MG tablet Take 2 tablets (162 mg total) by mouth daily. (Patient not taking: Reported on 10/03/2023) 60 tablet 6    Blood Pressure Monitor MISC For regular home bp monitoring during pregnancy 1 each 0    busPIRone  (BUSPAR ) 10 MG tablet TAKE 1 TABLET BY MOUTH TWICE A DAY 180 tablet 2    famotidine  (PEPCID ) 20 MG tablet Take 1 tablet (20 mg total) by mouth 2 (two) times daily. 60 tablet 0    haloperidol  (HALDOL ) 0.5 MG tablet Take 1-2  tablets (0.5-1 mg total) by mouth every 6 (six) hours as needed (Vomiting). Take only if other medications  have not worked (Patient not taking: Reported on 10/03/2023) 60 tablet 0    metoCLOPramide  (REGLAN ) 10 MG tablet Take 1 tablet (10 mg total) by mouth every 8 (eight) hours as needed. 30 tablet 1    NIFEdipine  (PROCARDIA ) 10 MG capsule Take 1 capsule (10 mg total) by mouth every 4 (four) hours as needed (contractions). (Patient not taking: Reported on 10/03/2023) 60 capsule 0    ondansetron  (ZOFRAN -ODT) 4 MG disintegrating tablet Take 1 tablet (4 mg total) by mouth every 8 (eight) hours as needed for nausea or vomiting. 30 tablet 1    Prenatal Vit-Fe Fumarate-FA (PRENATAL VITAMIN PO) Take by mouth.      pyridOXINE  (VITAMIN B6) 50 MG tablet Take 1 tablet (50 mg total) by mouth 2 (two) times daily. (Patient not taking: Reported on 10/03/2023) 60 tablet 0    sertraline  (ZOLOFT ) 100 MG tablet TAKE 1 EVERY DAY 180 tablet 3      Review of Systems   All systems reviewed and negative except as stated in HPI  There were no vitals taken for this visit. General appearance: alert, cooperative, and appears stated age Lungs: clear to auscultation bilaterally Heart: regular rate and rhythm Abdomen: soft, non-tender; bowel sounds normal Pelvic: normal female genitalia Extremities: Homans sign is negative, no sign of DVT Presentation: cephalic Fetal monitoringBaseline: 135 bpm, Variability: Good {> 6 bpm), Accelerations: Reactive, and Decelerations: Absent Uterine activity q63mins     Prenatal labs: ABO, Rh: --/--/O POS (04/22 0454) Antibody: NEG (04/22 0454) Rubella: 2.13 (12/09 1504) RPR: NON REACTIVE (04/22 0456)  HBsAg: Negative (12/09 1504)  HIV: Non Reactive (02/25 0854)  GBS:      Lab Results  Component Value Date   GBS Negative 09/30/2020   GTT nrl Genetic screening  LR girl, AFP neg, Horizon neg x 4 Anatomy US  wnl  Immunization History  Administered Date(s) Administered    Influenza,inj,Quad PF,6+ Mos 08/22/2020   Tdap 08/22/2020, 09/07/2023    Prenatal Transfer Tool  Maternal Diabetes: No Genetic Screening: Normal Maternal Ultrasounds/Referrals: Normal Fetal Ultrasounds or other Referrals:  None Maternal Substance Abuse:  Yes:  Type: Marijuana Significant Maternal Medications:  Meds include: Zoloft  Other: Buspar   Significant Maternal Lab Results: Group B Strep negative Number of Prenatal Visits:greater than 3 verified prenatal visits Maternal Vaccinations:TDap Other Comments:  None   No results found for this or any previous visit (from the past 24 hours).  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 G2P1001 at [redacted]w[redacted]d here for PPROM/PTL.  #Labor: Expectant management vs AROM / Pit  #Pain: Per patient request #FWB: Cat 1 #GBS status:  negative #Feeding: Breastmilk  #Reproductive Life planning: Progesterone only pills  #Sinus tachycardia:   #Marijuana use disorder c/b cannabis hyperemesis and Mallory-Weiss tear: PP SW Consult  #Hx  of gHTN: Plts 295, LFTs 19/16, normotensive Bps since admit  #Hx of PPD #GAD/MDD: - continue home Zoloft  100mg  QAM, Buspar  10mg  BID   [facility],  Student-PA  10/03/2023, 11:34 AM

## 2023-10-03 NOTE — Progress Notes (Signed)
 HIGH-RISK PREGNANCY VISIT Patient name: Sarah Rollins MRN [id number]  Date of birth: Apr 12, 2001 Chief Complaint:   No chief complaint on file.  History of Present Illness:   Sarah Rollins is a 23 y.o. G17P1001 female at [redacted]w[redacted]d with an Estimated Date of Delivery: 10/29/23 being seen today for ongoing management of a high-risk pregnancy complicated by:  Preterm labor: Patient continues to have regular contractions.  She has been seen in [facility], per patient she reports that she was 5 cm.  However last note indicates that she is state 3 and half centimeters.  She does note some leakage of fluid, mostly discharge.  This a.m. when she pulled on her underwear there was dark green fluid on her pad that appeared consistent with meconium.  She reports that this is new  Records were reviewed fern test and wet mount were negative on 4/22  Denies fever/chills  Contractions: Regular. Vag. Bleeding: None.  Movement: Present.      08/24/2023   10:42 AM 05/16/2023   11:37 AM 07/23/2022    9:03 AM 04/23/2022    9:57 AM 03/30/2022    3:29 PM  Depression screen PHQ 2/9  Decreased Interest 0 3 0 2 1  Down, Depressed, Hopeless 0 3 1 1 2   PHQ - 2 Score 0 6 1 3 3   Altered sleeping  2 3 3 1   Tired, decreased energy  3 3 2 2   Change in appetite  2 1 1 2   Feeling bad or failure about yourself   3 2 2 1   Trouble concentrating  2 3 2  0  Moving slowly or fidgety/restless  1 3 3  0  Suicidal thoughts  0 0 0 0  PHQ-9 Score  19 16 16 9   Difficult doing work/chores   Extremely dIfficult Somewhat difficult Somewhat difficult     Current Outpatient Medications  Medication Instructions   aspirin  EC 162 mg, Oral, Daily   Rollins Pressure Monitor MISC For regular home bp monitoring during pregnancy   busPIRone  (BUSPAR ) 10 mg, Oral, 2 times daily   famotidine  (PEPCID ) 20 mg, Oral, 2 times daily   haloperidol  (HALDOL ) 0.5-1 mg, Oral, Every 6 hours PRN, Take only if other medications have not worked   metoCLOPramide   (REGLAN ) 10 mg, Oral, Every 8 hours PRN   NIFEdipine  (PROCARDIA ) 10 mg, Oral, Every 4 hours PRN   ondansetron  (ZOFRAN -ODT) 4 mg, Oral, Every 8 hours PRN   Prenatal Vit-Fe Fumarate-FA (PRENATAL VITAMIN PO) Take by mouth.   pyridOXINE  (VITAMIN B6) 50 mg, Oral, 2 times daily   sertraline  (ZOLOFT ) 100 MG tablet TAKE 1 EVERY DAY     Review of Systems:   Pertinent items are noted in HPI Denies headaches, visual changes, shortness of breath, chest pain, abdominal pain, severe nausea/vomiting, or problems with urination or bowel movements unless otherwise stated above. Pertinent History Reviewed:  Reviewed past medical,surgical, social, obstetrical and family history.  Reviewed problem list, medications and allergies. Physical Assessment:   Vitals:   10/03/23 0855  BP: 111/79  Pulse: (!) 105  Weight: 157 lb (71.2 kg)  Body mass index is 30.66 kg/m.           Physical Examination:   General appearance: alert, well appearing, and in no distress  Mental status: normal mood, behavior, speech, dress, motor activity, and thought processes  Skin: warm & dry   Extremities:      Cardiovascular: normal heart rate noted  Respiratory: normal respiratory effort, no distress  Abdomen: gravid,  soft, non-tender SSE: thick white mucus noted at cervix.  With cough- no obvious rupture appreciated. SVE: 3.5/50/-2- ballotable with palpable bag Dilation: 3.5 Effacement (%): 50 Station: -3  Fetal Status: Fetal Heart Rate (bpm): 130   Movement: Present    Fetal Surveillance Testing today: doppler   Chaperone:  pt declined, partner present     No results found for this or any previous visit (from the past 24 hours).   Assessment & Plan:  High-risk pregnancy: G2P1001 at [redacted]w[redacted]d with an Estimated Date of Delivery: 10/29/23   1) PPROM - While bag is palpated on exam, based on examination of what appears to be meconium fluid on pad and clinical history concern for rupture of membranes - Recommendation to  proceed with delivery, hospital team notified -no evidence of infection  Meds: No orders of the defined types were placed in this encounter.   Labs/procedures today:   Treatment Plan:  plan to admit to L&D for delivery   Follow-up: No follow-ups on file.   Future Appointments  Date Time Provider Department Center  10/20/2023 10:00 AM Rankin, Shuvon B, NP [facility] None    No orders of the defined types were placed in this encounter.   Jamiaya Bina, DO Attending Obstetrician & Gynecologist, [facility] for [employer], [facility]

## 2023-10-04 MED ORDER — WITCH HAZEL-GLYCERIN EX PADS: 1.0000 | MEDICATED_PAD | CUTANEOUS | Status: DC | PRN

## 2023-10-04 MED ORDER — MEASLES, MUMPS & RUBELLA VAC IJ SOLR: 0.5000 mL | Freq: Once | INTRAMUSCULAR | Status: DC

## 2023-10-04 MED ORDER — TETANUS-DIPHTH-ACELL PERTUSSIS 5-2.5-18.5 LF-MCG/0.5 IM SUSY: 0.5000 mL | PREFILLED_SYRINGE | Freq: Once | INTRAMUSCULAR | Status: DC

## 2023-10-04 MED ORDER — HALOPERIDOL 0.5 MG PO TABS: 0.5000 mg | ORAL_TABLET | Freq: Four times a day (QID) | ORAL | Status: DC | PRN

## 2023-10-04 MED ORDER — COCONUT OIL OIL: 1.0000 | TOPICAL_OIL | Status: DC | PRN

## 2023-10-04 MED ORDER — METHYLERGONOVINE MALEATE 0.2 MG/ML IJ SOLN: 0.2000 mg | INTRAMUSCULAR | Status: DC | PRN

## 2023-10-04 MED ORDER — SENNOSIDES-DOCUSATE SODIUM 8.6-50 MG PO TABS
2.0000 | ORAL_TABLET | ORAL | Status: DC
  Administered 2023-10-05: 2 via ORAL
  Filled 2023-10-04: qty 2

## 2023-10-04 MED ORDER — IBUPROFEN 600 MG PO TABS: 600.0000 mg | ORAL_TABLET | Freq: Four times a day (QID) | ORAL | Status: DC

## 2023-10-04 MED ORDER — BISACODYL 10 MG RE SUPP: 10.0000 mg | Freq: Every day | RECTAL | Status: DC | PRN

## 2023-10-04 MED ORDER — HALOPERIDOL 1 MG PO TABS
1.0000 mg | ORAL_TABLET | Freq: Four times a day (QID) | ORAL | Status: DC | PRN
  Administered 2023-10-04: 1 mg via ORAL
  Filled 2023-10-04 (×2): qty 1

## 2023-10-04 MED ORDER — ACETAMINOPHEN 325 MG PO TABS
650.0000 mg | ORAL_TABLET | ORAL | Status: DC | PRN
  Administered 2023-10-04 (×2): 650 mg via ORAL
  Filled 2023-10-04 (×2): qty 2

## 2023-10-04 MED ORDER — METHYLERGONOVINE MALEATE 0.2 MG PO TABS: 0.2000 mg | ORAL_TABLET | ORAL | Status: DC | PRN

## 2023-10-04 MED ORDER — DIPHENHYDRAMINE HCL 25 MG PO CAPS: 25.0000 mg | ORAL_CAPSULE | Freq: Four times a day (QID) | ORAL | Status: DC | PRN

## 2023-10-04 MED ORDER — FLEET ENEMA RE ENEM: 1.0000 | ENEMA | Freq: Every day | RECTAL | Status: DC | PRN

## 2023-10-04 MED ORDER — SERTRALINE HCL 100 MG PO TABS
100.0000 mg | ORAL_TABLET | Freq: Every day | ORAL | Status: DC
  Administered 2023-10-04 – 2023-10-05 (×2): 100 mg via ORAL
  Filled 2023-10-04 (×2): qty 1

## 2023-10-04 MED ORDER — KETOROLAC TROMETHAMINE 30 MG/ML IJ SOLN
30.0000 mg | Freq: Four times a day (QID) | INTRAMUSCULAR | Status: DC
Start: 2023-10-04 — End: 2023-10-04
  Administered 2023-10-04 (×2): 30 mg via INTRAVENOUS
  Filled 2023-10-04 (×2): qty 1

## 2023-10-04 MED ORDER — MEDROXYPROGESTERONE ACETATE 150 MG/ML IM SUSP: 150.0000 mg | INTRAMUSCULAR | Status: DC | PRN

## 2023-10-04 MED ORDER — PRENATAL MULTIVITAMIN CH
1.0000 | ORAL_TABLET | Freq: Every day | ORAL | Status: DC
  Administered 2023-10-04 – 2023-10-05 (×2): 1 via ORAL
  Filled 2023-10-04 (×2): qty 1

## 2023-10-04 MED ORDER — SIMETHICONE 80 MG PO CHEW: 80.0000 mg | CHEWABLE_TABLET | ORAL | Status: DC | PRN

## 2023-10-04 MED ORDER — PANTOPRAZOLE SODIUM 40 MG IV SOLR
40.0000 mg | Freq: Once | INTRAVENOUS | Status: AC
  Administered 2023-10-04: 40 mg via INTRAVENOUS
  Filled 2023-10-04: qty 10

## 2023-10-04 MED ORDER — BENZOCAINE-MENTHOL 20-0.5 % EX AERO: 1.0000 | INHALATION_SPRAY | CUTANEOUS | Status: DC | PRN

## 2023-10-04 MED ORDER — FERROUS SULFATE 325 (65 FE) MG PO TABS
325.0000 mg | ORAL_TABLET | ORAL | Status: DC
  Administered 2023-10-04: 325 mg via ORAL
  Filled 2023-10-04: qty 1

## 2023-10-04 MED ORDER — IBUPROFEN 600 MG PO TABS
600.0000 mg | ORAL_TABLET | Freq: Four times a day (QID) | ORAL | Status: DC
  Administered 2023-10-04 – 2023-10-05 (×4): 600 mg via ORAL
  Filled 2023-10-04 (×4): qty 1

## 2023-10-04 MED ORDER — ONDANSETRON 4 MG PO TBDP: 4.0000 mg | ORAL_TABLET | Freq: Three times a day (TID) | ORAL | Status: DC | PRN

## 2023-10-04 MED ORDER — DIBUCAINE (PERIANAL) 1 % EX OINT: 1.0000 | TOPICAL_OINTMENT | CUTANEOUS | Status: DC | PRN

## 2023-10-04 NOTE — Social Work (Signed)
CSW acknowledged consult and completed a clinical assessment.  There are no barriers to d/c.  Clinical assessment notes will be entered at a later time.  Tymar Polyak, LCSWA Clinical Social Worker [phone]  

## 2023-10-04 NOTE — Progress Notes (Signed)
 Post Partum Day 1 Subjective: no complaints, up ad lib, voiding and tolerating PO, small lochia, plans to breastfeed, oral progesterone-only contraceptive  Objective: Blood pressure 133/83, pulse 89, temperature 98.7 F (37.1 C), temperature source Oral, resp. rate 16, height 5' (1.524 m), weight 71.2 kg, SpO2 99%.  Physical Exam:  General: alert, cooperative and no distress Lochia:normal flow Chest: CTAB Heart: RRR no m/r/g Abdomen: +BS, soft, nontender,  Uterine Fundus: firm DVT Evaluation: No evidence of DVT seen on physical exam. Extremities: trace edema  Recent Labs    10/03/23 1228  HGB 12.2  HCT 35.4*    Assessment/Plan: Plan for discharge tomorrow, Breastfeeding, and Lactation consult   LOS: 1 day   Majel Scott 10/04/2023, 7:45 AM

## 2023-10-04 NOTE — Anesthesia Postprocedure Evaluation (Signed)
 Anesthesia Post Note  Patient: Sarah Rollins  Procedure(s) Performed: AN AD HOC LABOR EPIDURAL     Patient location during evaluation: Mother Baby Anesthesia Type: Epidural Level of consciousness: awake, oriented and awake and alert Pain management: pain level controlled Vital Signs Assessment: post-procedure vital signs reviewed and stable Respiratory status: spontaneous breathing, nonlabored ventilation and respiratory function stable Cardiovascular status: stable Postop Assessment: no headache, patient able to bend at knees, adequate PO intake, no apparent nausea or vomiting and able to ambulate Anesthetic complications: no   No notable events documented.  Last Vitals:  Vitals:   10/04/23 0100 10/04/23 0340  BP: 128/81 133/83  Pulse: 90 89  Resp: 16 16  Temp: 37.1 C   SpO2: 99% 99%    Last Pain:  Vitals:   10/04/23 0340  TempSrc:   PainSc: 0-No pain   Pain Goal:                   Dlynn Ranes

## 2023-10-04 NOTE — Lactation Note (Signed)
 This note was copied from a baby's chart. Lactation Consultation Note  Patient Name: Sarah Rollins [id number] Date: 10/04/2023 Age:25 hours Reason for consult: Follow-up assessment;Late-preterm 34-36.6wks  P2, 36 wks, @ 17 hrs of life. Per mom baby latching well. Her first baby was tongue-tied, in comparison this baby opens big and easily comes @ her. Encouraged mom her breasts remember- milk comes faster/easier with each pregnancy. Hand pump provided- encouraged best pump for colostrum movement. Discussed stomach and energy expectations in first days.  Discussed expectations @ breast- Day 1- sleepy/ feed every 3 hrs/ even 10 minutes is okay, Day 2 more awake/ feeding cues/longer feeds, and cluster feeding overnights brings milk in. Highlighted breast stimulation is tied directly to milk production. Discussed hands on breast and baby, keeping baby awake @ breast. Starting with hand expression & breast compression to get baby working @ breast, and gentle stimulation to keep baby working @ breast. Encouraged EBM for nipple care post feed. LC services and milk storage shared. Encouraged mom to call for assist anytime desired.     Maternal Data Does the patient have breastfeeding experience prior to this delivery?: Yes How long did the patient breastfeed?: 3 months to her now 25 yr old  Feeding Mother's Current Feeding Choice: Breast Milk and Formula   Interventions Interventions: Breast feeding basics reviewed;Hand express;Breast compression;Hand pump;Education;LC Services brochure;CDC milk storage guidelines  Discharge Pump: DEBP;Manual;Hands Free;Personal  Consult Status Consult Status: Follow-up Date: 10/05/23 Follow-up type: In-patient    [facility] 10/04/2023, 3:57 PM

## 2023-10-04 NOTE — Lactation Note (Signed)
 This note was copied from a baby's chart. Lactation Consultation Note  Patient Name: Sarah Rollins [id number] Date: 10/04/2023 Age:25 hours Reason for consult: Initial assessment;Late-preterm 34-36.6wks Baby is very sleepy. Mom stated the baby doesn't take the bottle well but will latch better. Mom stated baby is spitty and gagging a lot. Noted baby doing some light grunting at intervals. Discussed mom pumping. Mom agreed. Mom shown how to use DEBP & how to disassemble, clean, & reassemble parts. Mom pumped 7 ml. Mom stated she could easily hand express colostrum. Mom has a MEdela at home and knows how to use it. LC reviewed. LPI information reviewed about how sleepy they are, supplementing, time limit of feeding to conserve energy and calories. Mom encouraged to feed baby 8-12 times/24 hours and with feeding cues.  Encouraged mom to call for assistance as needed. Encouraged mom to call for assistance as needed or questions.   Maternal Data Has patient been taught Hand Expression?: Yes Does the patient have breastfeeding experience prior to this delivery?: Yes How long did the patient breastfeed?: 3 months to her now 25 yr old  Feeding Mother's Current Feeding Choice: Breast Milk and Formula Nipple Type: Slow - flow  LATCH Score                    Lactation Tools Discussed/Used Tools: Pump;Flanges Flange Size: 18 Breast pump type: Double-Electric Breast Pump Pump Education: Setup, frequency, and cleaning;Milk Storage Reason for Pumping: LPI Pumping frequency: q 3hr Pumped volume: 7 mL  Interventions Interventions: Breast feeding basics reviewed;Hand express;Pre-pump if needed;Expressed milk;DEBP;Education;LC Services brochure  Discharge    Consult Status Consult Status: Follow-up Date: 10/04/23 Follow-up type: In-patient    [name] G 10/04/2023, 6:19 AM

## 2023-10-04 NOTE — Lactation Note (Signed)
 This note was copied from a baby's chart. Lactation Consultation Note  Patient Name: Sarah Rollins [id number] Date: 10/04/2023 Age:25 hours  Attempted to see mom 2 times and she was sleeping. LC will try again.  Maternal Data    Feeding Nipple Type: Slow - flow  LATCH Score                    Lactation Tools Discussed/Used    Interventions    Discharge    Consult Status      Sarah Rollins 10/04/2023, 5:06 AM

## 2023-10-05 LAB — BIRTH TISSUE RECOVERY COLLECTION (PLACENTA DONATION)

## 2023-10-05 MED ORDER — COCONUT OIL OIL: 1.0000 | TOPICAL_OIL | Status: DC | PRN

## 2023-10-05 MED ORDER — IBUPROFEN 600 MG PO TABS: 600.0000 mg | ORAL_TABLET | Freq: Four times a day (QID) | ORAL | 0 refills | Status: DC

## 2023-10-05 MED ORDER — WITCH HAZEL-GLYCERIN EX PADS: 1.0000 | MEDICATED_PAD | CUTANEOUS | Status: DC | PRN

## 2023-10-05 MED ORDER — FERROUS SULFATE 325 (65 FE) MG PO TABS: 325.0000 mg | ORAL_TABLET | ORAL | 0 refills | Status: DC

## 2023-10-05 MED ORDER — ACETAMINOPHEN 325 MG PO TABS: 650.0000 mg | ORAL_TABLET | ORAL | Status: DC | PRN

## 2023-10-05 MED ORDER — BENZOCAINE-MENTHOL 20-0.5 % EX AERO: 1.0000 | INHALATION_SPRAY | CUTANEOUS | Status: DC | PRN

## 2023-10-05 NOTE — Lactation Note (Signed)
 This note was copied from a baby's chart. Lactation Consultation Note  Patient Name: Sarah Rollins [id number] Date: 10/05/2023 Age:25 hours Reason for consult: Follow-up assessment;Maternal discharge;Late-preterm 34-36.6wks  P2, 36 wks, @ 38 hrs of life. Peds MD request feeding be visualized for before DC. Time for baby to eatr with LC visit. Mom uses cross- cradle- starts with hand expression, positions baby well. Infant works well- both breasts- 30 minutes overall in LC presence- still working on left breast with LC exit. Couplet doing well together. Re-enforced aspects to look for with good latch.   Maternal Data Does the patient have breastfeeding experience prior to this delivery?: Yes How long did the patient breastfeed?: 3 months with her 25 yr old  Feeding Mother's Current Feeding Choice: Breast Milk and Formula  LATCH Score Latch: Grasps breast easily, tongue down, lips flanged, rhythmical sucking.  Audible Swallowing: Spontaneous and intermittent  Type of Nipple: Everted at rest and after stimulation  Comfort (Breast/Nipple): Soft / non-tender  Hold (Positioning): Assistance needed to correctly position infant at breast and maintain latch.  LATCH Score: 9   Lactation Tools Discussed/Used    Interventions Interventions: Breast feeding basics reviewed;Assisted with latch;Hand express;Breast compression;Education  Discharge Discharge Education: Engorgement and breast care Pump: DEBP;Manual;Hands Free;Personal  Consult Status Consult Status: Complete Date: 10/05/23 Follow-up type: In-patient    Trayvond Viets 10/05/2023, 12:36 PM

## 2023-10-05 NOTE — Clinical Social Work Maternal (Addendum)
 CLINICAL SOCIAL WORK MATERNAL/CHILD NOTE  Patient Details  Name: Sarah Rollins MRN: [id number] Date of Birth: Jul 31, 2000  Date:  10/05/2023  Clinical Social Worker Initiating Note:  Sarah Rollins Date/Time: Initiated:  10/04/23/1449     Child's Name:  Sarah Rollins 10/03/2023   Biological Parents:  Mother, Father Sarah Rollins February 27[birth year] 1999, Sarah Rollins 06/03/2001)   Need for Interpreter:  None   Reason for Referral:  Current Substance Use/Substance Use During Pregnancy     Address:  [address] [place] [address]    Phone number:  [phone] (home)     Additional phone number:   Household Members/Support Persons (HM/SP):   Household Member/Support Person 1   HM/SP Name Relationship DOB or Age  HM/SP -1 Sarah Rollins FOB 06/03/2001  HM/SP -2  Sarah Rollins  son  10/09/2020  HM/SP -3        HM/SP -4        HM/SP -5        HM/SP -6        HM/SP -7        HM/SP -8          Natural Supports (not living in the home):  Extended Family   Professional Supports: None   Employment: Unemployed   Type of Work:     Education:  Engineer, agricultural   Homebound arranged:    Surveyor, quantity Resources:  Medicaid   Other Resources:  Sales executive     Cultural/Religious Considerations Which May Impact Care:    Strengths:  Ability to meet basic needs  , Psychotropic Medications, Home prepared for child  , Pediatrician chosen   Psychotropic Medications:  Zoloft , Buspar       Pediatrician:    Sarah Rollins  Pediatrician List:   [facility]    [facility]  [facility]      Pediatrician Fax Number:    Risk Factors/Current Problems:  None   Cognitive State:  Able to Concentrate  , Alert     Mood/Affect:  Calm  , Comfortable     CSW Assessment: 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 CSW met with MOB to complete assessment and offer support. CSW entered the room and  observed MOB in bed holding the inf a tn FOB at bedside, CSW introduced self, CSW role and reason for visit. MOB was agreeable to visit and allowed FOB to remain in the room. CSW inquired about how MOB was feeling, MOB reported food. CSW confirmed MOB address and phone number, MOB verified the information on file was correct.  CSW inquired about MOB MH hx, MOB reported her anxiety and depression symptoms continued from PPD. CSW inquired about treatment, MOB reported she is currently taking Zoloft  and Buspar  and the medication has been beneficial for her. CSW assessed for safety, MOB denied any SI or HI. CSW provided education regarding the baby blues period vs. perinatal mood disorders, discussed treatment and gave resources for mental health follow up if concerns arise.  CSW recommends self-evaluation during the postpartum time period using the New Mom Checklist from Postpartum Progress and encouraged MOB to contact a medical professional if symptoms are noted at any time. MOB  identified FOB and her sister as her primary supports.   CSW inquired about MOB's THC use, MOB reported she used THC to help with her nausea and did not recall  her last use. CSW explained the hospital drug screen policy, MOB verbalized understanding. CSW informed MOB infants UDS and CDS would be collected and a CPS report would be made if warranted. MOB verbalized understanding.   CSW provided review of Sudden Infant Death Syndrome (SIDS) precautions. MOB reported she has all necessary items for the infant including a bassinet, crib and car seat.  CSW identifies no further need for intervention and no barriers to discharge at this time.  CSW Plan/Description:  No Further Intervention Required/No Barriers to Discharge, Sudden Infant Death Syndrome (SIDS) Education, Perinatal Mood and Anxiety Disorder (PMADs) Education, CSW Will Continue to Monitor Umbilical Cord Tissue Drug Screen Results and Make Report if [facility] Drug  Screen Policy Information    Dyke Glasser, [place] 10/05/2023, 2:55 PM

## 2023-10-05 NOTE — Lactation Note (Signed)
 This note was copied from a baby's chart. Lactation Consultation Note  Patient Name: Sarah Rollins [id number] Date: 10/05/2023 Age:25 hours Reason for consult: Late-preterm 34-36.6wks;Maternal discharge;Follow-up assessment  P2, 36 wks, @ 37 hrs of life. DC anticipated today. Per mom baby doing great, good latching -minimal frustration @ breast. Encouraged starting with hand expression and using breast compression throughout if baby irritable during cluster feeding.  Encouraged mom to keep working on big mouth latch with baby and use EBM or coconut oil after each feed. Discussed cluster feeding overnight/ early morning brings in our milk supply, shared expectations of milk coming in. Highlighted risk of engorgement. Discussed hand pump/express to soften breasts, motrin  as anti-inflammatory, and ice packs for 10-20 minutes post feed/pumping if still over-full is the best treatments for inflamed/engorged breasts.  Maternal Data Does the patient have breastfeeding experience prior to this delivery?: Yes How long did the patient breastfeed?: 3 months to her now 23 yr old  Feeding Mother's Current Feeding Choice: Breast Milk and Formula   Interventions Interventions: Breast feeding basics reviewed;Education  Discharge Discharge Education: Engorgement and breast care Pump: DEBP;Manual;Hands Free;Personal  Consult Status Consult Status: Complete Date: 10/05/23 Follow-up type: In-patient    Raylan Troiani 10/05/2023, 11:01 AM

## 2023-10-11 ENCOUNTER — Encounter: Payer: Self-pay | Admitting: Advanced Practice Midwife

## 2023-10-12 ENCOUNTER — Encounter (HOSPITAL_COMMUNITY): Payer: Self-pay | Admitting: Obstetrics and Gynecology

## 2023-10-12 ENCOUNTER — Telehealth: Payer: Self-pay | Admitting: *Deleted

## 2023-10-12 ENCOUNTER — Inpatient Hospital Stay (HOSPITAL_COMMUNITY)
Admission: AD | Admit: 2023-10-12 | Discharge: 2023-10-12 | Disposition: A | Discharge status: Home / Self Care | Attending: Obstetrics and Gynecology | Admitting: Obstetrics and Gynecology

## 2023-10-12 DIAGNOSIS — O165 Unspecified maternal hypertension, complicating the puerperium: Secondary | ICD-10-CM

## 2023-10-12 DIAGNOSIS — Z79899 Other long term (current) drug therapy: Secondary | ICD-10-CM | POA: Diagnosis not present

## 2023-10-12 LAB — CBC
HCT: 32.4 % — ABNORMAL LOW (ref 36.0–46.0)
Hemoglobin: 10.8 g/dL — ABNORMAL LOW (ref 12.0–15.0)
MCH: 32.1 pg (ref 26.0–34.0)
MCHC: 33.3 g/dL (ref 30.0–36.0)
MCV: 96.4 fL (ref 80.0–100.0)
Platelets: 296 10*3/uL (ref 150–400)
RBC: 3.36 MIL/uL — ABNORMAL LOW (ref 3.87–5.11)
RDW: 12.6 % (ref 11.5–15.5)
WBC: 7.1 10*3/uL (ref 4.0–10.5)
nRBC: 0 % (ref 0.0–0.2)

## 2023-10-12 LAB — COMPREHENSIVE METABOLIC PANEL WITH GFR
ALT: 12 U/L (ref 0–44)
AST: 13 U/L — ABNORMAL LOW (ref 15–41)
Albumin: 2.9 g/dL — ABNORMAL LOW (ref 3.5–5.0)
Alkaline Phosphatase: 67 U/L (ref 38–126)
Anion gap: 7 (ref 5–15)
BUN: 10 mg/dL (ref 6–20)
CO2: 24 mmol/L (ref 22–32)
Calcium: 8.6 mg/dL — ABNORMAL LOW (ref 8.9–10.3)
Chloride: 107 mmol/L (ref 98–111)
Creatinine, Ser: 0.75 mg/dL (ref 0.44–1.00)
GFR, Estimated: >60 mL/min (ref >60)
Glucose, Bld: 89 mg/dL (ref 70–99)
Potassium: 3.7 mmol/L (ref 3.5–5.1)
Sodium: 138 mmol/L (ref 135–145)
Total Bilirubin: 0.3 mg/dL (ref 0.0–1.2)
Total Protein: 5.9 g/dL — ABNORMAL LOW (ref 6.5–8.1)

## 2023-10-12 MED ORDER — NIFEDIPINE 10 MG PO CAPS
20.0000 mg | ORAL_CAPSULE | Freq: Once | ORAL | Status: AC
  Administered 2023-10-12: 20 mg via ORAL
  Filled 2023-10-12: qty 2

## 2023-10-12 MED ORDER — NIFEDIPINE ER OSMOTIC RELEASE 30 MG PO TB24: 30.0000 mg | ORAL_TABLET | Freq: Every day | ORAL | 1 refills | Status: DC

## 2023-10-12 NOTE — MAU Note (Addendum)
 MAU Triage Note  .Sarah Rollins is a 23 y.o. at [redacted]w[redacted]d here in MAU reporting: PP 10/03/22 vag delivery headache x 3 days. Has had elevated BP since yesterday 150's 90-104. Has taken Pamprin for Headache with some relief.  Increased swelling in feet and face.   LMP:  Onset of complaint: 3 days Pain score: 8 Vitals:   10/12/23 1750  BP: (!) 142/90  Pulse: (!) 53  Resp: 18  Temp: 98.6 F (37 C)     FHT:   Lab orders placed from triage:

## 2023-10-12 NOTE — MAU Provider Note (Signed)
 History     CSN: [id number]  Arrival date and time: 10/12/23 1716   Event Date/Time   First Provider Initiated Contact with Patient 10/12/23 1856      Chief Complaint  Patient presents with   Hypertension   HPI  {GYN/OB [id number]}  Past Medical History:  Diagnosis Date   ADHD    Anxiety    Depression    Pregnancy induced hypertension    Vaginal Pap smear, abnormal     Past Surgical History:  Procedure Laterality Date   WISDOM TOOTH EXTRACTION      Family History  Problem Relation Age of Onset   Hypertension Mother    Heart Problems Mother    Anxiety disorder Mother    Depression Mother    Healthy Father    Depression Sister    Anxiety disorder Sister    Colon cancer Maternal Grandfather    Breast cancer Paternal Grandmother     Social History   Tobacco Use   Smoking status: Never   Smokeless tobacco: Never  Vaping Use   Vaping status: Never Used  Substance Use Topics   Alcohol use: Not Currently   Drug use: Yes    Types: Marijuana    Comment: daily    Allergies:  Allergies  Allergen Reactions   Ciprofloxacin Rash    Medications Prior to Admission  Medication Sig Dispense Refill Last Dose/Taking   acetaminophen  (TYLENOL ) 325 MG tablet Take 2 tablets (650 mg total) by mouth every 4 (four) hours as needed (for pain scale < 4).   Past Week   aspirin -acetaminophen -caffeine  (EXCEDRIN MIGRAINE) 250-250-65 MG tablet Take by mouth every 6 (six) hours as needed for headache.   10/12/2023 Morning   benzocaine -Menthol  (DERMOPLAST) 20-0.5 % AERO Apply 1 Application topically as needed for irritation (perineal discomfort).   Past Month   busPIRone  (BUSPAR ) 10 MG tablet TAKE 1 TABLET BY MOUTH TWICE A DAY 180 tablet 2 Past Month   coconut oil OIL Apply 1 Application topically as needed.   10/11/2023   famotidine  (PEPCID ) 20 MG tablet Take 1 tablet (20 mg total) by mouth 2 (two) times daily. 60 tablet 0 Past Month   ferrous sulfate  325 (65 FE) MG tablet Take 1  tablet (325 mg total) by mouth every other day. 45 tablet 0 Past Month   metoCLOPramide  (REGLAN ) 10 MG tablet Take 1 tablet (10 mg total) by mouth every 8 (eight) hours as needed. 30 tablet 1 Past Month   Prenatal Vit-Fe Fumarate-FA (PRENATAL VITAMIN PO) Take by mouth.   10/12/2023   sertraline  (ZOLOFT ) 100 MG tablet TAKE 1 EVERY DAY 180 tablet 3 10/12/2023   witch hazel-glycerin  (TUCKS) pad Apply 1 Application topically as needed for hemorrhoids.   Past Month   Blood Pressure Monitor MISC For regular home bp monitoring during pregnancy 1 each 0    haloperidol  (HALDOL ) 0.5 MG tablet Take 1-2 tablets (0.5-1 mg total) by mouth every 6 (six) hours as needed (Vomiting). Take only if other medications have not worked (Patient not taking: Reported on 10/03/2023) 60 tablet 0 More than a month   ibuprofen  (ADVIL ) 600 MG tablet Take 1 tablet (600 mg total) by mouth every 6 (six) hours. 30 tablet 0    ondansetron  (ZOFRAN -ODT) 4 MG disintegrating tablet Take 1 tablet (4 mg total) by mouth every 8 (eight) hours as needed for nausea or vomiting. 30 tablet 1 More than a month   pyridOXINE  (VITAMIN B6) 50 MG tablet Take 1 tablet (50 mg  total) by mouth 2 (two) times daily. (Patient not taking: Reported on 10/03/2023) 60 tablet 0 More than a month    Review of Systems Physical Exam   Blood pressure (!) 141/83, pulse (!) 51, temperature 98.6 F (37 C), resp. rate 18, height 5' (1.524 m), weight 69.9 kg, unknown if currently breastfeeding.  Physical Exam  MAU Course  Procedures Results for orders placed or performed during the hospital encounter of 10/12/23 (from the past 24 hours)  CBC     Status: Abnormal   Collection Time: 10/12/23  6:38 PM  Result Value Ref Range   WBC 7.1 4.0 - 10.5 K/uL   RBC 3.36 (L) 3.87 - 5.11 MIL/uL   Hemoglobin 10.8 (L) 12.0 - 15.0 g/dL   HCT 27.2 (L) 53.6 - 64.4 %   MCV 96.4 80.0 - 100.0 fL   MCH 32.1 26.0 - 34.0 pg   MCHC 33.3 30.0 - 36.0 g/dL   RDW 03.4 74.2 - 59.5 %    Platelets 296 150 - 400 K/uL   nRBC 0.0 0.0 - 0.2 %  Comprehensive metabolic panel with GFR     Status: Abnormal   Collection Time: 10/12/23  6:38 PM  Result Value Ref Range   Sodium 138 135 - 145 mmol/L   Potassium 3.7 3.5 - 5.1 mmol/L   Chloride 107 98 - 111 mmol/L   CO2 24 22 - 32 mmol/L   Glucose, Bld 89 70 - 99 mg/dL   BUN 10 6 - 20 mg/dL   Creatinine, Ser 6.38 0.44 - 1.00 mg/dL   Calcium 8.6 (L) 8.9 - 10.3 mg/dL   Total Protein 5.9 (L) 6.5 - 8.1 g/dL   Albumin 2.9 (L) 3.5 - 5.0 g/dL   AST 13 (L) 15 - 41 U/L   ALT 12 0 - 44 U/L   Alkaline Phosphatase 67 38 - 126 U/L   Total Bilirubin 0.3 0.0 - 1.2 mg/dL   GFR, Estimated >75 >64 mL/min   Anion gap 7 5 - 15    MDM ***  Assessment and Plan  ***  Sarah Rollins 10/12/2023, 6:56 PM   Reassessment (8:06 PM) -Results as above. -Provider to bedside and patient

## 2023-10-12 NOTE — Telephone Encounter (Signed)
 Patient called with c/o facial swelling, "worst headache in her life" for the past 3 days, seeing black spots and and lower extremity swelling.  Says she felt terrible yesterday, checked her bp last night with readings 154/104 and 148/102.  This morning readings 153/90 and 140/94.  Advised to go to MAU for eval.  Verbalized understanding.

## 2023-10-17 ENCOUNTER — Encounter: Payer: Self-pay | Admitting: *Deleted

## 2023-10-17 ENCOUNTER — Telehealth: Admitting: Women's Health

## 2023-10-17 ENCOUNTER — Encounter: Payer: Self-pay | Admitting: Women's Health

## 2023-10-17 VITALS — BP 128/86 | HR 122

## 2023-10-17 DIAGNOSIS — F418 Other specified anxiety disorders: Secondary | ICD-10-CM | POA: Diagnosis not present

## 2023-10-17 DIAGNOSIS — O165 Unspecified maternal hypertension, complicating the puerperium: Secondary | ICD-10-CM

## 2023-10-17 MED ORDER — SLYND 4 MG PO TABS: 1.0000 | ORAL_TABLET | Freq: Every day | ORAL | 3 refills | Status: DC

## 2023-10-17 NOTE — Progress Notes (Signed)
 TELEHEALTH VIRTUAL GYN VISIT ENCOUNTER NOTE Patient name: Sarah Rollins MRN [id number]  Date of birth: 2000/11/29  I connected with patient on 10/17/23 at 11:10 AM EDT by MyChart video  and verified that I am speaking with the correct person using two identifiers.  Pt is not currently in the office, she is at home.  Provider is in the office.    I discussed the limitations, risks, security and privacy concerns of performing an evaluation and management service by telephone and the availability of in person appointments. I also discussed with the patient that there may be a patient responsible charge related to this service. The patient expressed understanding and agreed to proceed.   Chief Complaint:   Follow-up (2 week mood and bp check)  History of Present Illness:   Miamore Barbarito is a 23 y.o. G83P1102 Caucasian female 2wks s/p SVB being evaluated today for bp and mood check. 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  Went to MAU 9d pp w/ elevated bp and bad headache, rx'd nifedipine  30mg  daily, taking as directed. Doing well overall w/ dep/anx, was anxious today for first time (baby wouldn't latch, other child was crying). Has appt Wed w/ new psychologist. Denies SI/HI/II. Breastfeeding, great supply. Wants to go ahead and start POPs, feeling great and thinks may start having sex before comes back for pp visit.     08/24/2023   10:42 AM 05/16/2023   11:37 AM 07/23/2022    9:03 AM 04/23/2022    9:57 AM 03/30/2022    3:29 PM  Depression screen PHQ 2/9  Decreased Interest 0 3 0 2 1  Down, Depressed, Hopeless 0 3 1 1 2   PHQ - 2 Score 0 6 1 3 3   Altered sleeping  2 3 3 1   Tired, decreased energy  3 3 2 2   Change in appetite  2 1 1 2   Feeling bad or failure about yourself   3 2 2 1   Trouble concentrating  2 3 2  0  Moving slowly or fidgety/restless  1 3 3  0  Suicidal thoughts  0 0 0 0  PHQ-9 Score  19 16 16 9   Difficult doing work/chores   Extremely dIfficult Somewhat difficult Somewhat  difficult    No LMP recorded. The current method of family planning is abstinence.  Review of Systems:   Pertinent items are noted in HPI Denies fever/chills, dizziness, headaches, visual disturbances, fatigue, shortness of breath, chest pain, abdominal pain, vomiting, abnormal vaginal discharge/itching/odor/irritation, problems with periods, bowel movements, urination, or intercourse unless otherwise stated above.  Pertinent History Reviewed:  Reviewed past medical,surgical, social, obstetrical and family history.  Reviewed problem list, medications and allergies. Physical Assessment:   Vitals:   10/17/23 1109  BP: 128/86  Pulse: (!) 122  There is no height or weight on file to calculate BMI.       Physical Examination:   General:  Alert, oriented and cooperative.   Mental Status: Normal mood and affect perceived. Normal judgment and thought content.  Physical exam deferred due to nature of the encounter  No results found for this or any previous visit (from the past 24 hours).  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    Dispense:  90 tablet    Refill:  3    No orders of the defined types were placed in this encounter.   I discussed the assessment and treatment plan with the patient. The patient was provided an opportunity to ask questions and all were answered. The patient agreed with the plan and demonstrated an understanding of the instructions.   The patient was advised to call back or seek an in-person evaluation/go to the ED if the symptoms worsen or if the condition fails  to improve as anticipated.  I provided 15 minutes of non-face-to-face time during this encounter.   Return for As scheduled.  Ferd Householder CNM, [facility] 10/17/2023 11:29 AM

## 2023-10-20 ENCOUNTER — Other Ambulatory Visit (HOSPITAL_COMMUNITY): Payer: Self-pay | Admitting: Obstetrics and Gynecology

## 2023-10-20 ENCOUNTER — Ambulatory Visit (HOSPITAL_COMMUNITY): Admitting: Registered Nurse

## 2023-11-08 ENCOUNTER — Ambulatory Visit: Admitting: Advanced Practice Midwife

## 2023-11-08 ENCOUNTER — Encounter: Payer: Self-pay | Admitting: Advanced Practice Midwife

## 2023-11-08 DIAGNOSIS — Z8751 Personal history of pre-term labor: Secondary | ICD-10-CM | POA: Diagnosis not present

## 2023-11-08 MED ORDER — SLYND 4 MG PO TABS: 1.0000 | ORAL_TABLET | Freq: Every day | ORAL | 3 refills | Status: DC

## 2023-11-08 MED ORDER — SLYND 4 MG PO TABS: 1.0000 | ORAL_TABLET | Freq: Every day | ORAL | 3 refills | Status: AC

## 2023-11-08 MED ORDER — SERTRALINE HCL 100 MG PO TABS: ORAL_TABLET | ORAL | 3 refills | Status: AC

## 2023-11-08 NOTE — Progress Notes (Signed)
 POSTPARTUM VISIT Patient name: Sarah Rollins MRN [id number]  Date of birth: [birth year] Chief Complaint:   Postpartum Care  History of Present Illness:   Aundria Bitterman is a 23 y.o. G84P1102 Caucasian female being seen today for a postpartum visit. She is 5 weeks postpartum following a preterm vag delivery at 36.2 gestational weeks. IOL: no. Anesthesia: epidural.  Laceration: none.  Complications: preterm birth, but otherwise no concerns. Inpatient contraception: yes started on Slynd .   Pregnancy complicated by N/V of preg; hx gHTN, anxiety/dep. Tobacco use: no. Substance use disorder: no. Last pap smear: Jan 2025 and results were ASCUS w/ HRHPV positive: type not specified. Next pap smear due: Jan 2026 No LMP recorded.  Postpartum course has been uncomplicated. Bleeding none. Bowel function is normal. Bladder function is normal. Urinary incontinence? no, fecal incontinence? no Patient is sexually active. Last sexual activity: since delivery. Desired contraception: POPs. Patient does not know about a pregnancy in the future.  Desired family size is unsure number of children.   Upstream - 11/08/23 1044       Pregnancy Intention Screening   Does the patient want to become pregnant in the next year? No    Does the patient's partner want to become pregnant in the next year? No    Would the patient like to discuss contraceptive options today? No      Contraception Wrap Up   Current Method Oral Contraceptive    End Method Oral Contraceptive    Contraception Counseling Provided Yes            The pregnancy intention screening data noted above was reviewed. Potential methods of contraception were discussed. The patient elected to proceed with Oral Contraceptive.  Edinburgh Postpartum Depression Screening: positive: H/O mental health disorder: yes anx/dep. Currently on meds: yes Zoloft  100mg .  Currently in therapy: no.  Sleeping: as expected.  Appetite: nl.  Still finds joy in things she used to:  Yes.  Support at home: husband.  SI/HI/II: no.  Interested in medicine: Yes.  Interested in therapy: No.  Edinburgh Postnatal Depression Scale - 11/08/23 1038       Edinburgh Postnatal Depression Scale:  In the Past 7 Days   I have been able to laugh and see the funny side of things. 0    I have looked forward with enjoyment to things. 0    I have blamed myself unnecessarily when things went wrong. 1    I have been anxious or worried for no good reason. 2    I have felt scared or panicky for no good reason. 0    Things have been getting on top of me. 2    I have been so unhappy that I have had difficulty sleeping. 0    I have felt sad or miserable. 1    I have been so unhappy that I have been crying. 0    The thought of harming myself has occurred to me. 0    Edinburgh Postnatal Depression Scale Total 6                05/16/2023   11:37 AM 07/23/2022    9:05 AM 04/23/2022    9:59 AM 03/30/2022    3:30 PM  GAD 7 : Generalized Anxiety Score  Nervous, Anxious, on Edge 3 3 3 1   Control/stop worrying 3 3 3 1   Worry too much - different things 3 3 3 2   Trouble relaxing 3 2 1 1   Restless 3  2 3 1   Easily annoyed or irritable 3 3 3 3   Afraid - awful might happen 3 2 2 1   Total GAD 7 Score 21 18 18 10   Anxiety Difficulty  Very difficult Somewhat difficult Somewhat difficult     Baby's course has been complicated by slow weight gain, but better now. Baby is feeding by breast and bottle: milk supply adequate. Infant has a pediatrician/family doctor? Yes.  Childcare strategy if returning to work/school: n/a-stay at home mom.  Pt has material needs met for her and baby: Yes.   Review of Systems:   Pertinent items are noted in HPI Denies Abnormal vaginal discharge w/ itching/odor/irritation, headaches, visual changes, shortness of breath, chest pain, abdominal pain, severe nausea/vomiting, or problems with urination or bowel movements. Pertinent History Reviewed:  Reviewed past  medical,surgical, obstetrical and family history.  Reviewed problem list, medications and allergies. OB History  Gravida Para Term Preterm AB Living  2 2 1 1  0 2  SAB IAB Ectopic Multiple Live Births  0 0 0 0 2    # Outcome Date GA Lbr Len/2nd Weight Sex Type Anes PTL Lv  2 Preterm 10/03/23 [redacted]w[redacted]d   F Vag-Spont EPI N LIV  1 Term 10/09/20 [redacted]w[redacted]d 16:54 / 01:34 6 lb 11 oz (3.033 kg) M Vag-Spont EPI N LIV     Complications: Gestational hypertension    Obstetric Comments  2025 PPROM   Physical Assessment:   Vitals:   11/08/23 1032  BP: 108/71  Pulse: 66  Weight: 151 lb (68.5 kg)  Height: 5' (1.524 m)  Body mass index is 29.49 kg/m.       Physical Examination:   General appearance: alert, well appearing, and in no distress  Mental status: alert, oriented to person, place, and time  Skin: warm & dry   Cardiovascular: normal heart rate noted   Respiratory: normal respiratory effort, no distress   Breasts: deferred, no complaints   Abdomen: soft, non-tender   Pelvic: examination not indicated. Thin prep pap obtained: No  Rectal: not examined  Extremities: Edema: none   Chaperone: N/A       No results found for this or any previous visit (from the past 24 hours).  Assessment & Plan:  1) Postpartum exam 2) Five wks s/p preterm vag delivery 3) breast & bottle feeding 4) Depression screening- mildly positive; is doing very well on Zoloft  100mg  and wants to maintain 5) Contraception management: already started on Slynd ; refills x 34yr and discussion of change to COC if desires after she weans 6) Hx abnl Pap (ASCUS, +HRHPV); for repeat in Jan 2026  Essential components of care per ACOG recommendations:  1.  Mood and well being:  If positive depression screen, discussed and plan developed.  If using tobacco we discussed reduction/cessation and risk of relapse If current substance abuse, we discussed and referral to local resources was offered.   2. Infant care and feeding:  If  breastfeeding, discussed returning to work, pumping, breastfeeding-associated pain, guidance regarding return to fertility while lactating if not using another method. If needed, patient was provided with a letter to be allowed to pump q 2-3hrs to support lactation in a private location with access to a refrigerator to store breastmilk.   Recommended that all caregivers be immunized for flu, pertussis and other preventable communicable diseases If pt does not have material needs met for her/baby, referred to local resources for help obtaining these.  3. Sexuality, contraception and birth spacing Provided guidance regarding  sexuality, management of dyspareunia, and resumption of intercourse Discussed avoiding interpregnancy interval <73mths and recommended birth spacing of 18 months  4. Sleep and fatigue Discussed coping options for fatigue and sleep disruption Encouraged family/partner/community support of 4 hrs of uninterrupted sleep to help with mood and fatigue  5. Physical recovery  If pt had a C/S, assessed incisional pain and providing guidance on normal vs prolonged recovery If pt had a laceration, perineal healing and pain reviewed.  If urinary or fecal incontinence, discussed management and referred to PT or uro/gyn if indicated  Patient is safe to resume physical activity. Discussed attainment of healthy weight.  6.  Chronic disease management Discussed pregnancy complications if any, and their implications for future childbearing and long-term maternal health. Review recommendations for prevention of recurrent pregnancy complications, such as 17 hydroxyprogesterone caproate to reduce risk for recurrent PTB not applicable, or aspirin  to reduce risk of preeclampsia yes. Pt had GDM: no. If yes, 2hr GTT scheduled: not applicable. Reviewed medications and non-pregnant dosing including consideration of whether pt is breastfeeding using a reliable resource such as LactMed: yes Referred for  f/u w/ PCP or subspecialist providers as indicated: not applicable  7. Health maintenance Mammogram at 25yo or earlier if indicated Pap smears as indicated  Meds:  Meds ordered this encounter  Medications   DISCONTD: Drospirenone  (SLYND ) 4 MG TABS    Sig: Take 1 tablet (4 mg total) by mouth daily.    Dispense:  90 tablet    Refill:  3    Supervising Provider:   Evalyn Hillier H [[id number]]   Drospirenone  (SLYND ) 4 MG TABS    Sig: Take 1 tablet (4 mg total) by mouth daily.    Dispense:  90 tablet    Refill:  3    Supervising Provider:   Evalyn Hillier H [[id number]]   sertraline  (ZOLOFT ) 100 MG tablet    Sig: TAKE 1 EVERY DAY    Dispense:  90 tablet    Refill:  3    Supervising Provider:   Wendelyn Halter [[id number]]    Follow-up: Return for Jan 2026- repeat Pap.   No orders of the defined types were placed in this encounter.   Jolayne Natter CNM 11/08/2023 11:09 AM

## 2023-11-19 ENCOUNTER — Other Ambulatory Visit: Payer: Self-pay | Admitting: Advanced Practice Midwife

## 2024-01-08 ENCOUNTER — Other Ambulatory Visit: Payer: Self-pay

## 2024-02-16 ENCOUNTER — Other Ambulatory Visit: Payer: Self-pay
